# Patient Record
Sex: Female | Born: 1960 | Race: Black or African American | Hispanic: No | Marital: Married | Smoking: Never smoker
Health system: Southern US, Community
[De-identification: ages and names within clinical notes are randomized; demographics above are authoritative.]

## PROBLEM LIST (undated history)

## (undated) DIAGNOSIS — T7840XA Allergy, unspecified, initial encounter: Secondary | ICD-10-CM

## (undated) DIAGNOSIS — K5790 Diverticulosis of intestine, part unspecified, without perforation or abscess without bleeding: Secondary | ICD-10-CM

## (undated) DIAGNOSIS — I10 Essential (primary) hypertension: Secondary | ICD-10-CM

## (undated) DIAGNOSIS — K219 Gastro-esophageal reflux disease without esophagitis: Secondary | ICD-10-CM

## (undated) DIAGNOSIS — E669 Obesity, unspecified: Secondary | ICD-10-CM

## (undated) DIAGNOSIS — Z9189 Other specified personal risk factors, not elsewhere classified: Secondary | ICD-10-CM

## (undated) DIAGNOSIS — R011 Cardiac murmur, unspecified: Secondary | ICD-10-CM

## (undated) DIAGNOSIS — I080 Rheumatic disorders of both mitral and aortic valves: Secondary | ICD-10-CM

## (undated) DIAGNOSIS — Z87898 Personal history of other specified conditions: Secondary | ICD-10-CM

## (undated) DIAGNOSIS — J309 Allergic rhinitis, unspecified: Secondary | ICD-10-CM

## (undated) DIAGNOSIS — E785 Hyperlipidemia, unspecified: Secondary | ICD-10-CM

## (undated) DIAGNOSIS — M1990 Unspecified osteoarthritis, unspecified site: Secondary | ICD-10-CM

## (undated) DIAGNOSIS — R7302 Impaired glucose tolerance (oral): Secondary | ICD-10-CM

## (undated) DIAGNOSIS — K649 Unspecified hemorrhoids: Secondary | ICD-10-CM

## (undated) DIAGNOSIS — K589 Irritable bowel syndrome without diarrhea: Secondary | ICD-10-CM

## (undated) HISTORY — DX: Essential (primary) hypertension: I10

## (undated) HISTORY — PX: BREAST EXCISIONAL BIOPSY: SUR124

## (undated) HISTORY — DX: Impaired glucose tolerance (oral): R73.02

## (undated) HISTORY — DX: Diverticulosis of intestine, part unspecified, without perforation or abscess without bleeding: K57.90

## (undated) HISTORY — DX: Gastro-esophageal reflux disease without esophagitis: K21.9

## (undated) HISTORY — DX: Unspecified hemorrhoids: K64.9

## (undated) HISTORY — DX: Other specified personal risk factors, not elsewhere classified: Z91.89

## (undated) HISTORY — DX: Irritable bowel syndrome without diarrhea: K58.9

## (undated) HISTORY — DX: Allergy, unspecified, initial encounter: T78.40XA

## (undated) HISTORY — PX: LEG SURGERY: SHX1003

## (undated) HISTORY — PX: COLONOSCOPY: SHX174

## (undated) HISTORY — DX: Personal history of other specified conditions: Z87.898

## (undated) HISTORY — PX: BREAST BIOPSY: SHX20

## (undated) HISTORY — DX: Obesity, unspecified: E66.9

## (undated) HISTORY — DX: Hyperlipidemia, unspecified: E78.5

## (undated) HISTORY — PX: JOINT REPLACEMENT: SHX530

## (undated) HISTORY — PX: EYE SURGERY: SHX253

## (undated) HISTORY — DX: Allergic rhinitis, unspecified: J30.9

## (undated) HISTORY — DX: Rheumatic disorders of both mitral and aortic valves: I08.0

## (undated) HISTORY — PX: DG 5TH DIGIT LEFT FOOT: HXRAD1653

---

## 1997-12-04 ENCOUNTER — Encounter: Admission: RE | Admit: 1997-12-04 | Discharge: 1997-12-04 | Payer: Self-pay | Admitting: Family Medicine

## 1997-12-26 ENCOUNTER — Encounter: Admission: RE | Admit: 1997-12-26 | Discharge: 1997-12-26 | Payer: Self-pay | Admitting: Sports Medicine

## 1997-12-29 ENCOUNTER — Inpatient Hospital Stay (HOSPITAL_COMMUNITY): Admission: AD | Admit: 1997-12-29 | Discharge: 1997-12-29 | Payer: Self-pay | Admitting: Obstetrics & Gynecology

## 1998-01-10 ENCOUNTER — Encounter: Admission: RE | Admit: 1998-01-10 | Discharge: 1998-01-10 | Payer: Self-pay | Admitting: Family Medicine

## 1998-01-16 ENCOUNTER — Ambulatory Visit (HOSPITAL_COMMUNITY): Admission: RE | Admit: 1998-01-16 | Discharge: 1998-01-16 | Payer: Self-pay | Admitting: Obstetrics & Gynecology

## 1998-01-24 ENCOUNTER — Encounter: Admission: RE | Admit: 1998-01-24 | Discharge: 1998-01-24 | Payer: Self-pay | Admitting: Family Medicine

## 1998-02-18 ENCOUNTER — Encounter: Admission: RE | Admit: 1998-02-18 | Discharge: 1998-02-18 | Payer: Self-pay | Admitting: Family Medicine

## 1998-02-18 ENCOUNTER — Ambulatory Visit (HOSPITAL_COMMUNITY): Admission: RE | Admit: 1998-02-18 | Discharge: 1998-02-18 | Payer: Self-pay | Admitting: Family Medicine

## 1998-02-21 ENCOUNTER — Ambulatory Visit (HOSPITAL_COMMUNITY): Admission: RE | Admit: 1998-02-21 | Discharge: 1998-02-21 | Payer: Self-pay | Admitting: Obstetrics

## 1998-03-07 ENCOUNTER — Encounter: Admission: RE | Admit: 1998-03-07 | Discharge: 1998-03-07 | Payer: Self-pay | Admitting: Obstetrics & Gynecology

## 1998-08-18 ENCOUNTER — Inpatient Hospital Stay (HOSPITAL_COMMUNITY): Admission: EM | Admit: 1998-08-18 | Discharge: 1998-08-21 | Payer: Self-pay | Admitting: Emergency Medicine

## 1998-08-18 ENCOUNTER — Encounter: Payer: Self-pay | Admitting: Emergency Medicine

## 1998-08-18 ENCOUNTER — Encounter: Payer: Self-pay | Admitting: Orthopedic Surgery

## 1998-11-20 ENCOUNTER — Encounter: Admission: RE | Admit: 1998-11-20 | Discharge: 1998-11-20 | Payer: Self-pay | Admitting: Family Medicine

## 1999-02-06 ENCOUNTER — Other Ambulatory Visit: Admission: RE | Admit: 1999-02-06 | Discharge: 1999-02-06 | Payer: Self-pay | Admitting: *Deleted

## 1999-03-05 ENCOUNTER — Encounter: Payer: Self-pay | Admitting: *Deleted

## 1999-03-05 ENCOUNTER — Ambulatory Visit (HOSPITAL_COMMUNITY): Admission: RE | Admit: 1999-03-05 | Discharge: 1999-03-05 | Payer: Self-pay | Admitting: *Deleted

## 1999-04-14 ENCOUNTER — Other Ambulatory Visit: Admission: RE | Admit: 1999-04-14 | Discharge: 1999-04-14 | Payer: Self-pay | Admitting: Gynecology

## 1999-05-01 ENCOUNTER — Encounter (INDEPENDENT_AMBULATORY_CARE_PROVIDER_SITE_OTHER): Payer: Self-pay | Admitting: Specialist

## 1999-05-01 ENCOUNTER — Other Ambulatory Visit: Admission: RE | Admit: 1999-05-01 | Discharge: 1999-05-01 | Payer: Self-pay | Admitting: Gynecology

## 1999-07-23 ENCOUNTER — Inpatient Hospital Stay (HOSPITAL_COMMUNITY): Admission: AD | Admit: 1999-07-23 | Discharge: 1999-07-23 | Payer: Self-pay | Admitting: Gynecology

## 1999-10-22 ENCOUNTER — Other Ambulatory Visit: Admission: RE | Admit: 1999-10-22 | Discharge: 1999-10-22 | Payer: Self-pay | Admitting: Gynecology

## 2000-09-14 ENCOUNTER — Other Ambulatory Visit: Admission: RE | Admit: 2000-09-14 | Discharge: 2000-09-14 | Payer: Self-pay | Admitting: *Deleted

## 2000-09-22 ENCOUNTER — Encounter: Payer: Self-pay | Admitting: *Deleted

## 2000-09-22 ENCOUNTER — Encounter: Admission: RE | Admit: 2000-09-22 | Discharge: 2000-09-22 | Payer: Self-pay | Admitting: *Deleted

## 2000-12-19 ENCOUNTER — Encounter: Payer: Self-pay | Admitting: *Deleted

## 2000-12-19 ENCOUNTER — Ambulatory Visit (HOSPITAL_COMMUNITY): Admission: RE | Admit: 2000-12-19 | Discharge: 2000-12-19 | Payer: Self-pay | Admitting: *Deleted

## 2001-09-25 ENCOUNTER — Encounter: Admission: RE | Admit: 2001-09-25 | Discharge: 2001-11-08 | Payer: Self-pay | Admitting: Otolaryngology

## 2001-09-26 ENCOUNTER — Encounter: Payer: Self-pay | Admitting: *Deleted

## 2001-09-26 ENCOUNTER — Encounter: Admission: RE | Admit: 2001-09-26 | Discharge: 2001-09-26 | Payer: Self-pay | Admitting: *Deleted

## 2001-09-26 ENCOUNTER — Other Ambulatory Visit: Admission: RE | Admit: 2001-09-26 | Discharge: 2001-09-26 | Payer: Self-pay | Admitting: *Deleted

## 2002-10-01 ENCOUNTER — Other Ambulatory Visit: Admission: RE | Admit: 2002-10-01 | Discharge: 2002-10-01 | Payer: Self-pay | Admitting: *Deleted

## 2002-10-03 ENCOUNTER — Encounter: Payer: Self-pay | Admitting: Internal Medicine

## 2002-10-03 ENCOUNTER — Encounter: Admission: RE | Admit: 2002-10-03 | Discharge: 2002-10-03 | Payer: Self-pay | Admitting: Internal Medicine

## 2003-05-17 ENCOUNTER — Emergency Department (HOSPITAL_COMMUNITY): Admission: EM | Admit: 2003-05-17 | Discharge: 2003-05-17 | Payer: Self-pay | Admitting: Emergency Medicine

## 2003-10-07 ENCOUNTER — Other Ambulatory Visit: Admission: RE | Admit: 2003-10-07 | Discharge: 2003-10-07 | Payer: Self-pay | Admitting: Gynecology

## 2003-10-07 ENCOUNTER — Encounter: Admission: RE | Admit: 2003-10-07 | Discharge: 2003-10-07 | Payer: Self-pay | Admitting: Internal Medicine

## 2004-06-12 ENCOUNTER — Ambulatory Visit: Payer: Self-pay | Admitting: Internal Medicine

## 2004-10-09 ENCOUNTER — Other Ambulatory Visit: Admission: RE | Admit: 2004-10-09 | Discharge: 2004-10-09 | Payer: Self-pay | Admitting: Obstetrics and Gynecology

## 2004-10-12 ENCOUNTER — Encounter: Admission: RE | Admit: 2004-10-12 | Discharge: 2004-10-12 | Payer: Self-pay | Admitting: Internal Medicine

## 2004-12-08 ENCOUNTER — Ambulatory Visit: Payer: Self-pay | Admitting: Internal Medicine

## 2004-12-18 ENCOUNTER — Ambulatory Visit: Payer: Self-pay | Admitting: Internal Medicine

## 2005-10-13 ENCOUNTER — Other Ambulatory Visit: Admission: RE | Admit: 2005-10-13 | Discharge: 2005-10-13 | Payer: Self-pay | Admitting: Obstetrics and Gynecology

## 2005-10-13 ENCOUNTER — Encounter: Admission: RE | Admit: 2005-10-13 | Discharge: 2005-10-13 | Payer: Self-pay | Admitting: Internal Medicine

## 2006-02-07 ENCOUNTER — Ambulatory Visit: Payer: Self-pay | Admitting: Internal Medicine

## 2006-05-02 ENCOUNTER — Ambulatory Visit: Payer: Self-pay | Admitting: Internal Medicine

## 2006-10-19 ENCOUNTER — Encounter: Admission: RE | Admit: 2006-10-19 | Discharge: 2006-10-19 | Payer: Self-pay | Admitting: Internal Medicine

## 2007-03-31 ENCOUNTER — Ambulatory Visit: Payer: Self-pay | Admitting: Internal Medicine

## 2007-04-02 ENCOUNTER — Encounter: Payer: Self-pay | Admitting: Internal Medicine

## 2007-04-02 DIAGNOSIS — K219 Gastro-esophageal reflux disease without esophagitis: Secondary | ICD-10-CM

## 2007-04-02 DIAGNOSIS — E785 Hyperlipidemia, unspecified: Secondary | ICD-10-CM

## 2007-04-02 DIAGNOSIS — I080 Rheumatic disorders of both mitral and aortic valves: Secondary | ICD-10-CM

## 2007-04-02 DIAGNOSIS — J309 Allergic rhinitis, unspecified: Secondary | ICD-10-CM

## 2007-04-02 DIAGNOSIS — Z87898 Personal history of other specified conditions: Secondary | ICD-10-CM | POA: Insufficient documentation

## 2007-04-02 DIAGNOSIS — K589 Irritable bowel syndrome without diarrhea: Secondary | ICD-10-CM

## 2007-04-02 DIAGNOSIS — M545 Low back pain, unspecified: Secondary | ICD-10-CM | POA: Insufficient documentation

## 2007-04-02 DIAGNOSIS — Z9189 Other specified personal risk factors, not elsewhere classified: Secondary | ICD-10-CM | POA: Insufficient documentation

## 2007-04-02 DIAGNOSIS — E669 Obesity, unspecified: Secondary | ICD-10-CM

## 2007-04-02 DIAGNOSIS — G43909 Migraine, unspecified, not intractable, without status migrainosus: Secondary | ICD-10-CM | POA: Insufficient documentation

## 2007-04-02 HISTORY — DX: Obesity, unspecified: E66.9

## 2007-04-02 HISTORY — DX: Hyperlipidemia, unspecified: E78.5

## 2007-04-02 HISTORY — DX: Rheumatic disorders of both mitral and aortic valves: I08.0

## 2007-04-02 HISTORY — DX: Irritable bowel syndrome, unspecified: K58.9

## 2007-04-02 HISTORY — DX: Personal history of other specified conditions: Z87.898

## 2007-04-02 HISTORY — DX: Allergic rhinitis, unspecified: J30.9

## 2007-04-02 HISTORY — DX: Other specified personal risk factors, not elsewhere classified: Z91.89

## 2007-04-24 ENCOUNTER — Ambulatory Visit: Payer: Self-pay | Admitting: Internal Medicine

## 2007-04-24 LAB — CONVERTED CEMR LAB
ALT: 19 units/L (ref 0–35)
Alkaline Phosphatase: 86 units/L (ref 39–117)
BUN: 8 mg/dL (ref 6–23)
Basophils Absolute: 0 10*3/uL (ref 0.0–0.1)
Cholesterol: 218 mg/dL (ref 0–200)
Creatinine, Ser: 0.7 mg/dL (ref 0.4–1.2)
Direct LDL: 153.8 mg/dL
Eosinophils Absolute: 0.2 10*3/uL (ref 0.0–0.6)
Eosinophils Relative: 2.8 % (ref 0.0–5.0)
Glucose, Bld: 102 mg/dL — ABNORMAL HIGH (ref 70–99)
HCT: 39.3 % (ref 36.0–46.0)
Monocytes Absolute: 0.4 10*3/uL (ref 0.2–0.7)
Monocytes Relative: 7.3 % (ref 3.0–11.0)
Neutro Abs: 2.7 10*3/uL (ref 1.4–7.7)
Platelets: 265 10*3/uL (ref 150–400)
RDW: 13.3 % (ref 11.5–14.6)
Sodium: 142 meq/L (ref 135–145)
Specific Gravity, Urine: 1.02 (ref 1.000–1.03)
TSH: 2.44 microintl units/mL (ref 0.35–5.50)
Total Bilirubin: 0.7 mg/dL (ref 0.3–1.2)
Total Protein, Urine: NEGATIVE mg/dL
Total Protein: 7.5 g/dL (ref 6.0–8.3)
Urine Glucose: NEGATIVE mg/dL
Urobilinogen, UA: 0.2 (ref 0.0–1.0)
VLDL: 26 mg/dL (ref 0–40)
WBC: 5.6 10*3/uL (ref 4.5–10.5)

## 2007-05-04 ENCOUNTER — Encounter: Payer: Self-pay | Admitting: Internal Medicine

## 2007-05-04 ENCOUNTER — Ambulatory Visit: Payer: Self-pay | Admitting: Internal Medicine

## 2007-05-04 DIAGNOSIS — I10 Essential (primary) hypertension: Secondary | ICD-10-CM

## 2007-05-04 HISTORY — DX: Essential (primary) hypertension: I10

## 2007-06-20 ENCOUNTER — Telehealth (INDEPENDENT_AMBULATORY_CARE_PROVIDER_SITE_OTHER): Payer: Self-pay | Admitting: *Deleted

## 2007-06-23 ENCOUNTER — Ambulatory Visit: Payer: Self-pay | Admitting: Internal Medicine

## 2007-06-23 DIAGNOSIS — R21 Rash and other nonspecific skin eruption: Secondary | ICD-10-CM

## 2007-09-01 ENCOUNTER — Telehealth: Payer: Self-pay | Admitting: Internal Medicine

## 2007-10-04 ENCOUNTER — Telehealth: Payer: Self-pay | Admitting: Internal Medicine

## 2007-10-23 ENCOUNTER — Other Ambulatory Visit: Admission: RE | Admit: 2007-10-23 | Discharge: 2007-10-23 | Payer: Self-pay | Admitting: Obstetrics and Gynecology

## 2007-10-23 ENCOUNTER — Encounter: Admission: RE | Admit: 2007-10-23 | Discharge: 2007-10-23 | Payer: Self-pay | Admitting: Internal Medicine

## 2007-12-20 ENCOUNTER — Telehealth: Payer: Self-pay | Admitting: Internal Medicine

## 2008-01-01 ENCOUNTER — Ambulatory Visit: Payer: Self-pay | Admitting: Internal Medicine

## 2008-03-01 ENCOUNTER — Telehealth: Payer: Self-pay | Admitting: Internal Medicine

## 2008-04-24 ENCOUNTER — Ambulatory Visit: Payer: Self-pay | Admitting: Internal Medicine

## 2008-04-24 LAB — CONVERTED CEMR LAB
AST: 19 units/L (ref 0–37)
BUN: 9 mg/dL (ref 6–23)
Bilirubin Urine: NEGATIVE
Bilirubin, Direct: 0.1 mg/dL (ref 0.0–0.3)
CO2: 29 meq/L (ref 19–32)
Calcium: 9.1 mg/dL (ref 8.4–10.5)
Chloride: 105 meq/L (ref 96–112)
Cholesterol: 181 mg/dL (ref 0–200)
Eosinophils Absolute: 0.1 10*3/uL (ref 0.0–0.7)
HCT: 39.4 % (ref 36.0–46.0)
Ketones, ur: NEGATIVE mg/dL
Monocytes Absolute: 0.3 10*3/uL (ref 0.1–1.0)
Neutro Abs: 3.1 10*3/uL (ref 1.4–7.7)
Neutrophils Relative %: 51.6 % (ref 43.0–77.0)
Nitrite: NEGATIVE
Potassium: 3.8 meq/L (ref 3.5–5.1)
RBC: 4.77 M/uL (ref 3.87–5.11)
RDW: 13.2 % (ref 11.5–14.6)
Sodium: 140 meq/L (ref 135–145)
Specific Gravity, Urine: <=1.005 (ref 1.000–1.03)
TSH: 1.45 microintl units/mL (ref 0.35–5.50)
Total Protein, Urine: NEGATIVE mg/dL
Urine Glucose: NEGATIVE mg/dL
VLDL: 12 mg/dL (ref 0–40)
WBC: 5.9 10*3/uL (ref 4.5–10.5)
pH: 7 (ref 5.0–8.0)

## 2008-05-06 ENCOUNTER — Ambulatory Visit: Payer: Self-pay | Admitting: Internal Medicine

## 2008-05-06 DIAGNOSIS — J069 Acute upper respiratory infection, unspecified: Secondary | ICD-10-CM | POA: Insufficient documentation

## 2008-06-24 ENCOUNTER — Telehealth: Payer: Self-pay | Admitting: Internal Medicine

## 2008-09-20 ENCOUNTER — Telehealth (INDEPENDENT_AMBULATORY_CARE_PROVIDER_SITE_OTHER): Payer: Self-pay | Admitting: *Deleted

## 2008-09-26 ENCOUNTER — Ambulatory Visit: Payer: Self-pay | Admitting: Internal Medicine

## 2008-09-26 DIAGNOSIS — L299 Pruritus, unspecified: Secondary | ICD-10-CM | POA: Insufficient documentation

## 2008-10-29 ENCOUNTER — Other Ambulatory Visit: Admission: RE | Admit: 2008-10-29 | Discharge: 2008-10-29 | Payer: Self-pay | Admitting: Obstetrics and Gynecology

## 2008-10-29 ENCOUNTER — Encounter: Admission: RE | Admit: 2008-10-29 | Discharge: 2008-10-29 | Payer: Self-pay | Admitting: Internal Medicine

## 2009-03-20 ENCOUNTER — Ambulatory Visit: Payer: Self-pay | Admitting: Internal Medicine

## 2009-03-20 DIAGNOSIS — M79609 Pain in unspecified limb: Secondary | ICD-10-CM | POA: Insufficient documentation

## 2009-03-21 ENCOUNTER — Telehealth: Payer: Self-pay | Admitting: Internal Medicine

## 2009-04-25 ENCOUNTER — Ambulatory Visit: Payer: Self-pay | Admitting: Internal Medicine

## 2009-04-25 DIAGNOSIS — J029 Acute pharyngitis, unspecified: Secondary | ICD-10-CM

## 2009-04-25 LAB — CONVERTED CEMR LAB
Basophils Relative: 0.9 % (ref 0.0–3.0)
Bilirubin, Direct: 0.1 mg/dL (ref 0.0–0.3)
CO2: 32 meq/L (ref 19–32)
Calcium: 9.3 mg/dL (ref 8.4–10.5)
Chloride: 102 meq/L (ref 96–112)
Eosinophils Absolute: 0.2 10*3/uL (ref 0.0–0.7)
GFR calc non Af Amer: 136.19 mL/min (ref >60)
Glucose, Bld: 84 mg/dL (ref 70–99)
HCT: 39.3 % (ref 36.0–46.0)
HDL: 45.1 mg/dL (ref >39.00)
Lymphocytes Relative: 34.5 % (ref 12.0–46.0)
Monocytes Absolute: 0.3 10*3/uL (ref 0.1–1.0)
Neutrophils Relative %: 56.2 % (ref 43.0–77.0)
Nitrite: NEGATIVE
Potassium: 4 meq/L (ref 3.5–5.1)
RBC: 4.68 M/uL (ref 3.87–5.11)
RDW: 14.6 % (ref 11.5–14.6)
Rapid Strep: NEGATIVE
Sodium: 139 meq/L (ref 135–145)
Specific Gravity, Urine: 1.01 (ref 1.000–1.030)
TSH: 0.83 microintl units/mL (ref 0.35–5.50)
Total Protein: 7.3 g/dL (ref 6.0–8.3)
Urine Glucose: NEGATIVE mg/dL
VLDL: 13.8 mg/dL (ref 0.0–40.0)
pH: 7 (ref 5.0–8.0)

## 2009-04-28 ENCOUNTER — Telehealth: Payer: Self-pay | Admitting: Internal Medicine

## 2009-05-09 ENCOUNTER — Ambulatory Visit: Payer: Self-pay | Admitting: Internal Medicine

## 2009-06-27 ENCOUNTER — Telehealth: Payer: Self-pay | Admitting: Internal Medicine

## 2009-07-02 ENCOUNTER — Telehealth: Payer: Self-pay | Admitting: Internal Medicine

## 2009-07-02 ENCOUNTER — Ambulatory Visit: Payer: Self-pay | Admitting: Internal Medicine

## 2009-07-12 ENCOUNTER — Emergency Department (HOSPITAL_COMMUNITY): Admission: EM | Admit: 2009-07-12 | Discharge: 2009-07-12 | Payer: Self-pay | Admitting: Emergency Medicine

## 2009-10-30 ENCOUNTER — Other Ambulatory Visit: Admission: RE | Admit: 2009-10-30 | Discharge: 2009-10-30 | Payer: Self-pay | Admitting: Obstetrics and Gynecology

## 2009-10-30 ENCOUNTER — Encounter: Admission: RE | Admit: 2009-10-30 | Discharge: 2009-10-30 | Payer: Self-pay | Admitting: Internal Medicine

## 2009-11-05 LAB — CONVERTED CEMR LAB: Pap Smear: NORMAL

## 2009-12-18 ENCOUNTER — Encounter (INDEPENDENT_AMBULATORY_CARE_PROVIDER_SITE_OTHER): Payer: Self-pay | Admitting: *Deleted

## 2009-12-22 ENCOUNTER — Ambulatory Visit: Payer: Self-pay | Admitting: Internal Medicine

## 2009-12-30 ENCOUNTER — Telehealth: Payer: Self-pay | Admitting: Internal Medicine

## 2010-01-01 ENCOUNTER — Ambulatory Visit: Payer: Self-pay | Admitting: Internal Medicine

## 2010-02-18 ENCOUNTER — Ambulatory Visit: Payer: Self-pay | Admitting: Internal Medicine

## 2010-02-18 ENCOUNTER — Encounter: Payer: Self-pay | Admitting: Internal Medicine

## 2010-02-18 DIAGNOSIS — M899 Disorder of bone, unspecified: Secondary | ICD-10-CM | POA: Insufficient documentation

## 2010-02-18 DIAGNOSIS — M949 Disorder of cartilage, unspecified: Secondary | ICD-10-CM

## 2010-02-18 DIAGNOSIS — M25579 Pain in unspecified ankle and joints of unspecified foot: Secondary | ICD-10-CM

## 2010-02-23 ENCOUNTER — Encounter: Payer: Self-pay | Admitting: Internal Medicine

## 2010-02-24 ENCOUNTER — Encounter: Payer: Self-pay | Admitting: Internal Medicine

## 2010-02-24 ENCOUNTER — Ambulatory Visit (HOSPITAL_COMMUNITY): Admission: RE | Admit: 2010-02-24 | Discharge: 2010-02-24 | Payer: Self-pay | Admitting: Internal Medicine

## 2010-03-02 ENCOUNTER — Encounter: Payer: Self-pay | Admitting: Internal Medicine

## 2010-06-25 LAB — CONVERTED CEMR LAB
ALT: 13 units/L (ref 0–35)
Albumin: 4 g/dL (ref 3.5–5.2)
Alkaline Phosphatase: 93 units/L (ref 39–117)
Basophils Absolute: 0 10*3/uL (ref 0.0–0.1)
Bilirubin Urine: NEGATIVE
Bilirubin, Direct: 0.1 mg/dL (ref 0.0–0.3)
CO2: 32 meq/L (ref 19–32)
Chloride: 103 meq/L (ref 96–112)
Cholesterol: 212 mg/dL — ABNORMAL HIGH (ref 0–200)
Direct LDL: 138.9 mg/dL
Eosinophils Relative: 2.7 % (ref 0.0–5.0)
GFR calc non Af Amer: 132.99 mL/min (ref >60.00)
Lymphocytes Relative: 36.2 % (ref 12.0–46.0)
MCHC: 33.6 g/dL (ref 30.0–36.0)
Monocytes Relative: 5.7 % (ref 3.0–12.0)
Neutrophils Relative %: 55.1 % (ref 43.0–77.0)
Nitrite: NEGATIVE
Potassium: 3.9 meq/L (ref 3.5–5.1)
RBC: 4.79 M/uL (ref 3.87–5.11)
RDW: 14 % (ref 11.5–14.6)
TSH: 1.05 microintl units/mL (ref 0.35–5.50)
Total Bilirubin: 0.4 mg/dL (ref 0.3–1.2)
Total CHOL/HDL Ratio: 5
Total Protein, Urine: NEGATIVE mg/dL
Urine Glucose: NEGATIVE mg/dL
Urobilinogen, UA: 0.2 (ref 0.0–1.0)
WBC: 5.9 10*3/uL (ref 4.5–10.5)
pH: 7.5 (ref 5.0–8.0)

## 2010-06-26 ENCOUNTER — Ambulatory Visit: Payer: Self-pay | Admitting: Internal Medicine

## 2010-07-03 ENCOUNTER — Encounter: Payer: Self-pay | Admitting: Internal Medicine

## 2010-07-03 ENCOUNTER — Ambulatory Visit: Payer: Self-pay | Admitting: Internal Medicine

## 2010-07-03 DIAGNOSIS — K089 Disorder of teeth and supporting structures, unspecified: Secondary | ICD-10-CM | POA: Insufficient documentation

## 2010-08-02 ENCOUNTER — Encounter: Payer: Self-pay | Admitting: Gynecology

## 2010-08-02 ENCOUNTER — Encounter: Payer: Self-pay | Admitting: Internal Medicine

## 2010-08-06 ENCOUNTER — Telehealth (INDEPENDENT_AMBULATORY_CARE_PROVIDER_SITE_OTHER): Payer: Self-pay | Admitting: *Deleted

## 2010-08-11 NOTE — Consult Note (Signed)
Summary: [facility]  [facility]   Imported By: Lester Parchment 03/02/2010 10:06:46  _____________________________________________________________________  External Attachment:    Type:   Image     Comment:   External Document

## 2010-08-11 NOTE — Miscellaneous (Signed)
Summary: [facility]  Clinical Lists Changes  Medications: Added new medication of MOVIPREP 100 GM  SOLR (PEG-KCL-NACL-NASULF-NA ASC-C) As per prep instructions. - Signed Rx of MOVIPREP 100 GM  SOLR (PEG-KCL-NACL-NASULF-NA ASC-C) As per prep instructions.;  #1 x 0;  Signed;  Entered by: Ezra Sites RN;  Authorized by: Hilarie Fredrickson MD;  Method used: [facility] [address] [phone]*, [address] [place], [place], [place]  [address], Ph: [phone] or [phone], Fax: [phone] Observations: Added new observation of ALLERGY REV: Done (12/22/2009 13:24)    Prescriptions: MOVIPREP 100 GM  SOLR (PEG-KCL-NACL-NASULF-NA ASC-C) As per prep instructions.  #1 x 0   Entered by:   Ezra Sites RN   Authorized by:   Hilarie Fredrickson MD   Signed by:   Ezra Sites RN on 12/22/2009   Method used:   [facility]        [facility] [address] [phone]* (retail)       [address]       [place], [place]  [address]       Ph: [phone] or [phone]       Fax: [phone]   RxID:   [id number]

## 2010-08-11 NOTE — Assessment & Plan Note (Signed)
Summary: ANKLE PAIN AFTER FALL A MO. AGO/NWS  #   Vital Signs:  Patient profile:   50 year old female Height:      62 inches Weight:      166.50 pounds BMI:     30.56 O2 Sat:      96 % on Room air Temp:     98 degrees F oral Pulse rate:   76 / minute BP sitting:   120 / 80  (left arm) Cuff size:   regular  Vitals Entered By: Zella Ball Ewing CMA Duncan Dull) (February 18, 2010 8:19 AM)  O2 Flow:  Room air CC: Left ankle pain for 1 month/RE   Primary Care Provider:  Corwin Levins MD  CC:  Left ankle pain for 1 month/RE.  History of Present Illness: here wtih persistent left ankle pain and swelling, after walking the dogs a month ago  - twisted up in the leashes , tripped and fell and prob twisted the ankle, since it seemed to pain and swell just after;  some overall improved, but seems to just never resolve in the past 4 wks iwth pain and swelling;  has hx of fx of leg and has a screw to the left ankle area but she is not sure of details (surgury done 2000 - does not remember ortho).  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 No fever, hx of gout, and no other injiury or fall since then.  Overall pain is 7/10, with wt bearing 10/10 per pt.  Preventive Screening-Counseling & Management      Drug Use:  no.    Problems Prior to Update: 1)  Pharyngitis-acute  (ICD-462) 2)  Leg Pain, Bilateral  (ICD-729.5) 3)  Unspecified Pruritic Disorder  (ICD-698.9) 4)  Uri  (ICD-465.9) 5)  Preventive Health Care  (ICD-V70.0) 6)  Rash-nonvesicular  (ICD-782.1) 7)  Hypertension, Benign Essential  (ICD-401.1) 8)  Preventive Health Care  (ICD-V70.0) 9)  Low Back Pain  (ICD-724.2) 10)  Hyperlipidemia  (ICD-272.4) 11)  Gerd   (ICD-530.81) 12)  Allergic Rhinitis  (ICD-477.9) 13)  Mitral Regurgitation, 0 (MILD)  (ICD-396.3) 14)  Irritable Bowel Syndrome  (ICD-564.1) 15)  Obesity  (ICD-278.00) 16)  Breast Biopsy, Hx of  (ICD-V15.9) 17)  Migraine Headache  (ICD-346.90) 18)  Fibrocystic Breast Disease, Hx of  (ICD-V13.9)  Medications Prior to Update: 1)  Ecotrin 325 Mg  Tbec (Aspirin) .[name].. 1 By Mouth Qd 2)  Benazepril Hcl 20 Mg Tabs (Benazepril Hcl) .[name].. 1 By Mouth Once Daily 3)  Black Cohosh 40 Mg Caps (Black Cohosh) .[name].. 1 By Mouth Three Times A Day With Each Meal 4)  Tramadol Hcl 50 Mg Tabs (Tramadol Hcl) .[name].. 1po Four Time Per Day As Needed For Pain 5)  Fluconazole 150 Mg Tabs (Fluconazole) .[name].. 1 By Mouth Q 3 Days As Needed 6)  Nystatin 100000 Unit/ml Susp (Nystatin) .[name].. 1 Tsp Swish and Swallow X 7 Days, Then As Needed 7)  Benzonatate 100 Mg Caps (Benzonatate) .[name].. 1 By Mouth Three Times A Day X 7 Days, Then As Needed Cough 8)  Prilosec Otc 20 Mg Tbec (Omeprazole Magnesium) .[name].. 1 By Mouth Once Daily X 7 Days, Then As Needed 9)  Claritin-D 12  Hour 5-120 Mg Xr12h-Tab (Loratadine-Pseudoephedrine) .[name].. 1 By Mouth  Every Morning X 7 Days, Then As Needed  Current Medications (verified): 1)  Ecotrin 325 Mg  Tbec (Aspirin) .[name].. 1 By Mouth Qd 2)  Benazepril Hcl 20 Mg Tabs (Benazepril Hcl) .[name].. 1 By Mouth Once Daily 3)  Black Cohosh 40 Mg Caps (Black Cohosh) .[name].. 1 By Mouth Three Times A Day With Each Meal 4)  Tramadol Hcl 50 Mg Tabs (Tramadol Hcl) .[name].. 1po Four Time Per Day As Needed For Pain 5)  Fluconazole 150 Mg Tabs (Fluconazole) .[name].. 1 By Mouth Q 3 Days As Needed 6)  Nystatin 100000 Unit/ml Susp (Nystatin) .[name].. 1 Tsp Swish and Swallow X 7 Days, Then As Needed 7)  Benzonatate 100 Mg Caps (Benzonatate) .[name].. 1 By Mouth Three Times A Day X 7 Days, Then As Needed Cough 8)  Prilosec Otc 20 Mg Tbec (Omeprazole Magnesium) .[name].. 1 By Mouth Once Daily X 7 Days, Then As Needed 9)  Claritin-D 12 Hour 5-120 Mg Xr12h-Tab  (Loratadine-Pseudoephedrine) .[name].. 1 By Mouth  Every Morning X 7 Days, Then As Needed 10)  Hydrocodone-Acetaminophen 5-325 Mg Tabs (Hydrocodone-Acetaminophen) .[name].. 1po Q 6 Hrs As Needed Pain  Allergies (verified): 1)  ! Pcn 2)  ! * Hyoscyamine 3)  ! * Tramadol 4)  ! * Benazepril 5)  ! * Alleve 6)  ! 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   Head:  normocephalic and atraumatic.   Eyes:  vision grossly intact, pupils equal, and pupils round.   Ears:  R ear normal and L ear normal.   Nose:  no external deformity and no nasal discharge.   Mouth:  no gingival abnormalities and pharynx pink and moist.   Neck:  supple and no masses.   Lungs:  normal respiratory effort and normal breath sounds.   Heart:  normal rate and regular rhythm.   Msk:  left lateral ankle with 3+ swelling but mild tender  with mild decr ROM -o/w neurovasc intact Extremities:  no edema, no erythema    Impression & Recommendations:  Problem # 1:  ANKLE PAIN, LEFT (ICD-719.47)  suspect more significant pain,swelling related to instability of prior surgury - for pain med, film today, and  refer [place] ortho (bednarz if possible)  Orders: T-Ankle Comp Left Min 3 Views [phone]) Orthopedic Surgeon Referral (Ortho Surgeon)  Problem # 2:  HYPERTENSION, BENIGN ESSENTIAL (ICD-401.1)  Her updated medication list for this problem includes:    Benazepril Hcl 20 Mg Tabs (Benazepril hcl) .[name]... 1 by mouth once daily  BP today: 120/80 Prior BP: 120/90 (07/02/2009)  Prior 10 Yr Risk Heart Disease: 8 % (04/25/2009)  Labs Reviewed: K+: 4.0 (04/25/2009) Creat: : 0.6 (04/25/2009)   Chol: 205 (04/25/2009)   HDL: 45.10 (  04/25/2009)   LDL: 127 (04/24/2008)   TG: 69.0 (04/25/2009) stable overall by hx and exam, ok to continue meds/tx as is   Complete Medication List: 1)  Ecotrin 325 Mg Tbec (Aspirin) .[name].. 1 by mouth qd 2)  Benazepril Hcl 20 Mg Tabs (Benazepril hcl) .[name].. 1 by mouth once daily 3)  Black Cohosh 40 Mg Caps (Black cohosh) .[name].. 1 by mouth three times a day with each meal 4)  Tramadol Hcl 50 Mg Tabs (Tramadol hcl) .[name].. 1po four time per day as needed for pain 5)  Fluconazole 150 Mg Tabs (Fluconazole) .[name].. 1 by mouth q 3 days as needed 6)  Nystatin 100000 Unit/ml Susp (Nystatin) .[name].. 1 tsp swish and swallow x 7 days, then as needed 7)  Benzonatate 100 Mg Caps (Benzonatate) .[name].. 1 by mouth three times a day x 7 days, then as needed cough 8)  Prilosec Otc 20 Mg Tbec (Omeprazole magnesium) .[name].. 1 by mouth once daily x 7 days, then as needed 9)  Claritin-d 12 Hour 5-120 Mg Xr12h-tab (Loratadine-pseudoephedrine) .[name].. 1 by mouth  every morning x 7 days, then as needed 10)  Hydrocodone-acetaminophen 5-325 Mg Tabs (Hydrocodone-acetaminophen) .[name].. 1po q 6 hrs as needed pain  Patient Instructions: 1)  Please go to Radiology in the basement level for your X-Ray today  2)  You will be contacted about the referral(s) to: orthopedic 3)  Please take all new medications as prescribed - the pain medication 4)  Continue all previous medications as before this visit  5)  Please call the number on  the [facility] for results of your testing  6)  Please schedule a follow-up appointment in 2 months with CPX labs Prescriptions: HYDROCODONE-ACETAMINOPHEN 5-325 MG TABS (HYDROCODONE-ACETAMINOPHEN) 1po q 6 hrs as needed pain  #60 x 1   Entered and Authorized by:   Corwin Levins MD   Signed by:   Corwin Levins MD on 02/18/2010   Method used:   Print then Give to Patient   RxID:   [id number]

## 2010-08-11 NOTE — Progress Notes (Signed)
Summary: prep ?  Phone Note From Pharmacy Call back at [phone]   Caller: Waynetta Sandy, pharm tech Call For: Dr. Marina Goodell   Summary of Call: pt would like cheaper prep Initial call taken by: Vallarie Mare,  December 30, 2009 4:41 PM  Follow-up for Phone Call        Pharmacy tech "gone home" when i called her at 5:09pm today, so then i called patient, she said she did not have money for this, so i did offer the $20 mail in rebate since her procedure was 6/23, she agreeed and thought that would be helpful for buying the prep. i placed this with her chart in [facility]! Follow-up by: Sherren Kerns RN,  December 30, 2009 5:21 PM

## 2010-08-11 NOTE — Letter (Signed)
Summary: [facility] Instructions  [facility]  [address] [place], [place] [address]   Phone: [phone]  Fax: [phone]       Sylvia Davies    27-Jun-1961    MRN: [id number]        Procedure Day /Date: Thursday 01/01/2010     Arrival Time: 7:30 am      Procedure Time: 8:30 am     Location of Procedure:                    _x _  [facility] (4th Floor)                        PREPARATION FOR COLONOSCOPY WITH MOVIPREP   Starting 5 days prior to your procedure Saturday 6/18 do not eat nuts, seeds, popcorn, corn, beans, peas,  salads, or any raw vegetables.  Do not take any fiber supplements (e.g. Metamucil, Citrucel, and Benefiber).  THE DAY BEFORE YOUR PROCEDURE         DATE: Wednesday 6/22  1.  Drink clear liquids the entire day-NO SOLID FOOD  2.  Do not drink anything colored red or purple.  Avoid juices with pulp.  No orange juice.  3.  Drink at least 64 oz. (8 glasses) of fluid/clear liquids during the day to prevent dehydration and help the prep work efficiently.  CLEAR LIQUIDS INCLUDE: Water Jello Ice Popsicles Tea (sugar ok, no milk/cream) Powdered fruit flavored drinks Coffee (sugar ok, no milk/cream) Gatorade Juice: apple, white grape, white cranberry  Lemonade Clear bullion, consomm, broth Carbonated beverages (any kind) Strained chicken noodle soup Hard Candy                             4.  In the morning, mix first dose of MoviPrep solution:    Empty 1 Pouch A and 1 Pouch B into the disposable container    Add lukewarm drinking water to the top line of the container. Mix to dissolve    Refrigerate (mixed solution should be used within 24 hrs)  5.  Begin drinking the prep at 5:00 p.m. The MoviPrep container is divided by 4 marks.   Every 15 minutes drink the solution down to the next mark (approximately 8 oz) until the full liter is complete.   6.  Follow completed prep with 16 oz of clear liquid of your choice  (Nothing red or purple).  Continue to drink clear liquids until bedtime.  7.  Before going to bed, mix second dose of MoviPrep solution:    Empty 1 Pouch A and 1 Pouch B into the disposable container    Add lukewarm drinking water to the top line of the container. Mix to dissolve    Refrigerate  THE DAY OF YOUR PROCEDURE      DATE: Thursday 6/23  Beginning at 3:30 a.m. (5 hours before procedure):         1. Every 15 minutes, drink the solution down to the next mark (approx 8 oz) until the full liter is complete.  2. Follow completed prep with 16 oz. of clear liquid of your choice.    3. You may drink clear liquids until 6:30 am (2 HOURS BEFORE PROCEDURE).   MEDICATION INSTRUCTIONS  Unless otherwise instructed, you should take regular prescription medications with a small sip of water   as early as possible the morning of your  procedure.           OTHER INSTRUCTIONS  You will need a responsible adult at least 52 years of age to accompany you and drive you home.   This person must remain in the waiting room during your procedure.  Wear loose fitting clothing that is easily removed.  Leave jewelry and other valuables at home.  However, you may wish to bring a book to read or  an iPod/MP3 player to listen to music as you wait for your procedure to start.  Remove all body piercing jewelry and leave at home.  Total time from sign-in until discharge is approximately 2-3 hours.  You should go home directly after your procedure and rest.  You can resume normal activities the  day after your procedure.  The day of your procedure you should not:   Drive   Make legal decisions   Operate machinery   Drink alcohol   Return to work  You will receive specific instructions about eating, activities and medications before you leave.    The above instructions have been reviewed and explained to me by   Ezra Sites RN  December 22, 2009 1:44 PM    I fully understand and  can verbalize these instructions _____________________________ Date _________

## 2010-08-11 NOTE — Procedures (Signed)
Summary: Colonoscopy  Patient: Sylvia Davies Note: All result statuses are Final unless otherwise noted.  Tests: (1) Colonoscopy (COL)   COL Colonoscopy           DONE     [facility]     [address]     [place], [place]  [address]           COLONOSCOPY PROCEDURE REPORT           PATIENT:  Sylvia Davies, Sylvia Davies  MR#:  [id number]     BIRTHDATE:  1960-12-21, 48 yrs. old  GENDER:  female     ENDOSCOPIST:  Wilhemina Bonito. Eda Keys, MD     REF. BY:  Screening Recall     PROCEDURE DATE:  01/01/2010     PROCEDURE:  Average-risk screening colonoscopy     G0121     ASA CLASS:  Class II     INDICATIONS:  Routine Risk Screening     MEDICATIONS:   Fentanyl 75 mcg IV, Versed 7 mg IV           DESCRIPTION OF PROCEDURE:   After the risks benefits and     alternatives of the procedure were thoroughly explained, informed     consent was obtained.  Digital rectal exam was performed and     revealed no abnormalities.   The LB [id number] endoscope     was introduced through the anus and advanced to the cecum, which     was identified by both the appendix and ileocecal valve, without     limitations.Time to cecum = 3:10 min. The quality of the prep was     excellent, using MoviPrep.  The instrument was then slowly     withdrawn (time = 9:54 min) as the colon was fully examined.     <<PROCEDUREIMAGES>>           FINDINGS:  Mild diverticulosis was found in the sigmoid colon.     This was otherwise a normal examination of the colon.  No polyps or     cancers were seen.   Retroflexed views in the rectum revealed no     abnormalities.    The scope was then withdrawn from the patient     and the procedure completed.           COMPLICATIONS:  None     ENDOSCOPIC IMPRESSION:     1) Mild diverticulosis in the sigmoid colon     2) Otherwise normal examination     3) No polyps or cancers           RECOMMENDATIONS:     1) Continue current colorectal screening recommendations for     "routine  risk" patients with a repeat colonoscopy in 10 years.           ______________________________     Wilhemina Bonito. Eda Keys, MD           CC:  Corwin Levins, MD;The Patient           n.     Rosalie DoctorWilhemina Bonito. Eda Keys at 01/01/2010 09:30 AM           Leland Johns, [phone]  Note: An exclamation mark (!) indicates a result that was not dispersed into the flowsheet. Document Creation Date: 01/01/2010 9:31 AM _______________________________________________________________________  (1) Order result status: Final Collection or observation date-time: 01/01/2010 09:24 Requested date-time:  Receipt date-time:  Reported date-time:  Referring Physician:  Ordering Physician: Fransico Setters [phone]) Specimen Source:  Source: Launa Grill Order Number: [id number] Lab site:   Appended Document: Colonoscopy     Procedures Next Due Date:    Colonoscopy: 01/2020

## 2010-08-11 NOTE — Miscellaneous (Signed)
Summary: Orders Update  Clinical Lists Changes  Problems: Added new problem of DISORDER OF BONE AND CARTILAGE UNSPECIFIED (ICD-733.90) Orders: Added new Referral order of Radiology Referral (Radiology) - Signed

## 2010-08-11 NOTE — Letter (Signed)
Summary: [facility]  [facility]   Imported By: Sherian Rein 03/05/2010 14:50:11  _____________________________________________________________________  External Attachment:    Type:   Image     Comment:   External Document

## 2010-08-11 NOTE — Assessment & Plan Note (Signed)
Summary: CPX/BCBS/$50/PN   Vital Signs:  Patient Profile:   50 Years Old Female Height:     62 inches Weight:      174.6 pounds O2 Sat:      94 % O2 treatment:    Room Air Temp:     99.0 degrees F oral Pulse rate:   69 / minute BP sitting:   116 / 70  (left arm) Cuff size:   regular  Vitals Entered By: Payton Spark CMA (May 06, 2008 10:38 AM)                 Preventive Care Screening  Last Flu Shot:    Date:  05/06/2008    Results:  declined  Last Tetanus Booster:    Next Due:  05/2015  Colonoscopy:    Next Due:  04/2011  Mammogram:    Date:  10/19/2007    Results:  normal   Pap Smear:    Date:  10/11/2007    Results:  normal    Chief Complaint:  cpx (bp106/75 on 04/27/08).  History of Present Illness: wt down one lb form 6/09; overall doing ok; some slight ST yesterday now better, no fever    Updated Prior Medication List: ECOTRIN 325 MG  TBEC (ASPIRIN) 1 by mouth qd DITROPAN 5 MG  TABS (OXYBUTYNIN CHLORIDE) 1 by mouth qh prn BENAZEPRIL HCL 10 MG  TABS (BENAZEPRIL HCL) 1 by mouth once daily AMLODIPINE BESYLATE 5 MG  TABS (AMLODIPINE BESYLATE) 1 by mouth once daily  Current Allergies (reviewed today): ! PCN ! * HYOSCYAMINE ! * TRAMADOL ! * BENAZEPRIL ! Sylvia Davies  Past Medical History:    Reviewed history from 04/02/2007 and no changes required:       Fibrocystic Breast Disease       Migraine Headaches       Obesity       IBS       Mild Mitral Regurgitation       Allergic rhinitis       GERD       Hyperlipidemia       Low back pain  Past Surgical History:    Reviewed history from 04/02/2007 and no changes required:       R eye surgery       Breast Lump Biopsy- 1996, negative       Leg Fracture   Family History:    Reviewed history from 05/04/2007 and no changes required:       mother -dwarfism       Family History Hypertension       grandmother with MI  Social History:    Reviewed history from 05/04/2007 and no changes  required:       Never Smoked       Alcohol use-no       Married       2 children       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   Risk Factors:  Mammogram History:     Date of Last Mammogram:  10/19/2007    Results:  normal   PAP Smear History:     Date of Last PAP Smear:  10/11/2007    Results:  normal    Review of Systems  The patient denies anorexia, fever, weight loss, weight gain, vision loss, decreased hearing, hoarseness, chest pain, syncope, dyspnea on exertion, peripheral edema, prolonged cough, headaches, hemoptysis, abdominal pain, melena, hematochezia, severe indigestion/heartburn, hematuria, incontinence, muscle weakness, suspicious skin lesions, transient  blindness, difficulty walking, depression, unusual weight change, abnormal bleeding, enlarged lymph nodes, angioedema, and breast masses.         all otherwise negative    Physical Exam  General:     alert and overweight-appearing.   Head:     Normocephalic and atraumatic without obvious abnormalities. No apparent alopecia or balding. Eyes:     No corneal or conjunctival inflammation noted. EOMI. Perrla. Funduscopic exam benign, without hemorrhages, exudates or papilledema. Vision grossly normal. Ears:     External ear exam shows no significant lesions or deformities.  Otoscopic examination reveals clear canals, tympanic membranes are intact bilaterally without bulging, retraction, inflammation or discharge. Hearing is grossly normal bilaterally. Nose:     External nasal examination shows no deformity or inflammation. Nasal mucosa are pink and moist without lesions or exudates. Mouth:     pharyngeal erythema and fair dentition.   Neck:     supple and full ROM.   Lungs:     Normal respiratory effort, chest expands symmetrically. Lungs are clear to auscultation, no crackles or wheezes. Heart:     Normal rate and regular rhythm. S1 and S2 normal without gallop, murmur, click, rub or other extra  sounds. Abdomen:     Bowel sounds positive,abdomen soft and non-tender without masses, organomegaly or hernias noted. Msk:     no joint tenderness and no joint swelling.   Extremities:     no edema, no ulcers  Neurologic:     cranial nerves II-XII intact and strength normal in all extremities.      Impression & Recommendations:  Problem # 1:  Preventive Health Care (ICD-V70.0) Overall doing well, up to date, counseled on routine health concerns for screening and prevention, immunizations up to date or declined, labs reviewed, ecg reviewed   Orders: EKG w/ Interpretation (93000)   Problem # 2:  URI (ICD-465.9)  Her updated medication list for this problem includes:    Ecotrin 325 Mg Tbec (Aspirin) .[name]... 1 by mouth qd mild viral, ok to follow  Problem # 3:  HYPERLIPIDEMIA (ICD-272.4)  Labs Reviewed: Chol: 181 (04/24/2008)   HDL: 41.9 (04/24/2008)   LDL: 127 (04/24/2008)   TG: 61 (04/24/2008) SGOT: 19 (04/24/2008)   SGPT: 15 (04/24/2008) d/w pt -to follow low chol diet  Complete Medication List: 1)  Ecotrin 325 Mg Tbec (Aspirin) .[name].. 1 by mouth qd 2)  Ditropan 5 Mg Tabs (Oxybutynin chloride) .[name].. 1 by mouth qh prn 3)  Benazepril Hcl 10 Mg Tabs (Benazepril hcl) .[name].. 1 by mouth once daily 4)  Amlodipine Besylate 5 Mg Tabs (Amlodipine besylate) .[name].. 1 by mouth once daily   Patient Instructions: 1)  Continue all medications that you may have been taking previously 2)  please follow low chol diet 3)  you can take Mucinex OTC for congestion, and advil OTC as needed for aches and pains 4)  please try to get more excercise and lose wt 5)  Please schedule a follow-up appointment in 1 year or sooner if needed   Prescriptions: AMLODIPINE BESYLATE 5 MG  TABS (AMLODIPINE BESYLATE) 1 by mouth once daily  #90 x 3   Entered by:   Payton Spark CMA   Authorized by:   Corwin Levins MD   Signed by:   Payton Spark CMA on 05/06/2008   Method used:   [facility]        [facility]   [address] [facility] [address] [phone]* (retail)       [address]  [place], [place]  [address]       Ph: [phone] or [phone]       Fax: [phone]   RxID:   [id number] BENAZEPRIL HCL 10 MG  TABS (BENAZEPRIL HCL) 1 by mouth once daily  #90 x 3   Entered by:   Payton Spark CMA   Authorized by:   Corwin Levins MD   Signed by:   Payton Spark CMA on 05/06/2008   Method used:   [facility]        [facility] [address] [phone]* (retail)       [address]       [place], [place]  [address]       Ph: [phone] or [phone]       Fax: [phone]   RxID:   [id number] DITROPAN 5 MG  TABS (OXYBUTYNIN CHLORIDE) 1 by mouth qh prn  #90 x 3   Entered by:   Payton Spark CMA   Authorized by:   Corwin Levins MD   Signed by:   Payton Spark CMA on 05/06/2008   Method used:   [facility] to        [facility] [address] [phone]* (retail)       [address]       [place], [place]  [address]       Ph: [phone] or [phone]       Fax: [phone]   RxID:   [id number]  ]

## 2010-08-13 NOTE — Miscellaneous (Signed)
Summary: Orders Update  Clinical Lists Changes  Orders: Added new Service order of EKG w/ Interpretation (93000) - Signed 

## 2010-08-13 NOTE — Assessment & Plan Note (Signed)
Summary: cpx-lb   Vital Signs:  Patient profile:   50 year old female Height:      62 inches Weight:      169 pounds BMI:     31.02 O2 Sat:      98 % on Room air Temp:     97.9 degrees F oral Pulse rate:   78 / minute BP sitting:   140 / 92  (left arm) Cuff size:   regular  Vitals Entered By: Zella Ball Ewing CMA Duncan Dull) (July 03, 2010 10:38 AM)  O2 Flow:  Room air  Preventive Care Screening  Pap Smear:    Date:  11/05/2009    Next Due:  11/2010    Results:  normal   Mammogram:    Next Due:  11/2010  CC: Adult Physical/RE   Primary Care Provider:  Corwin Levins MD  CC:  Adult Physical/RE.  History of Present Illness: here for wellness and f/u;  overall doing ok, incidently has quite painful tooth (molar left lower jaw) which is quite painful, worse to tough, needs root canal) but first appt she could get was jan 3;  OTC meds not working such as Hospital doctor and tylenol for pain;  no fever, sweling, d/c or drainage;  Pt denies CP, worsening sob, doe, wheezing, orthopnea, pnd, worsening LE edema, palps, dizziness or syncope  Pt denies new neuro symptoms such as headache, facial or extremity weakness  Pt denies polydipsia, polyuria,  Overall good compliance with meds, trying to follow low chol diet, wt coming down slowy with wt watcher program, and trying to walk about 1.5 miles 3-4 times per wk..  BP at the drug store always < 140/90.  No fever, wt loss, night sweats, loss of appetite or other constitutional symptoms  Overall good compliance with meds, and good tolerability.  Denies worsening depressive symptoms, suicidal ideation, or panic.   Pt states good ability with ADL's, low fall risk, home safety reviewed and adequate, no significant change in hearing or vision.    Problems Prior to Update: 1)  Unspecified Disorder Teeth&supporting Structures  (ICD-525.9) 2)  Disorder of Bone and Cartilage Unspecified  (ICD-733.90) 3)  Ankle Pain, Left  (ICD-719.47) 4)  Pharyngitis-acute   (ICD-462) 5)  Leg Pain, Bilateral  (ICD-729.5) 6)  Unspecified Pruritic Disorder  (ICD-698.9) 7)  Uri  (ICD-465.9) 8)  Preventive Health Care  (ICD-V70.0) 9)  Rash-nonvesicular  (ICD-782.1) 10)  Hypertension, Benign Essential  (ICD-401.1) 11)  Preventive Health Care  (ICD-V70.0) 12)  Low Back Pain  (ICD-724.2) 13)  Hyperlipidemia  (ICD-272.4) 14)  Gerd  (ICD-530.81) 15)  Allergic Rhinitis  (ICD-477.9) 16)  Mitral Regurgitation, 0 (MILD)  (ICD-396.3) 17)  Irritable Bowel Syndrome  (ICD-564.1) 18)  Obesity  (ICD-278.00) 19)  Breast Biopsy, Hx of  (ICD-V15.9) 20)  Migraine Headache  (ICD-346.90) 21)  Fibrocystic Breast Disease, Hx of  (ICD-V13.9)  Medications Prior to Update: 1)  Ecotrin 325 Mg  Tbec (Aspirin) .[name].. 1 By Mouth Qd 2)  Benazepril Hcl 20 Mg Tabs (Benazepril Hcl) .[name].. 1 By Mouth Once Daily 3)  Black Cohosh 40 Mg Caps (Black Cohosh) .[name].. 1 By Mouth Three Times A Day With Each Meal 4)  Tramadol Hcl 50 Mg Tabs (Tramadol Hcl) .[name].. 1po Four Time Per Day As Needed For Pain 5)  Fluconazole 150 Mg Tabs (Fluconazole) .[name].. 1 By Mouth Q 3 Days As Needed 6)  Nystatin 100000 Unit/ml Susp (Nystatin) .[name].. 1 Tsp Swish and Swallow X 7 Days, Then As  Needed 7)  Benzonatate 100 Mg Caps (Benzonatate) .[name].. 1 By Mouth Three Times A Day X 7 Days, Then As Needed Cough 8)  Prilosec Otc 20 Mg Tbec (Omeprazole Magnesium) .[name].. 1 By Mouth Once Daily X 7 Days, Then As Needed 9)  Claritin-D 12 Hour 5-120 Mg Xr12h-Tab (Loratadine-Pseudoephedrine) .[name].. 1 By Mouth  Every Morning X 7 Days, Then As Needed 10)  Hydrocodone-Acetaminophen 5-325 Mg Tabs (Hydrocodone-Acetaminophen) .[name].. 1po Q 6 Hrs As Needed Pain  Current Medications (verified): 1)  Ecotrin 325 Mg  Tbec (Aspirin) .[name].. 1 By Mouth Qd 2)  Benazepril Hcl 20 Mg Tabs (Benazepril Hcl) .[name].. 1 By Mouth Once Daily 3)  Black Cohosh 40 Mg Caps (Black Cohosh) .[name].. 1 By Mouth Three Times A Day With Each Meal 4)  Tramadol Hcl 50 Mg Tabs (Tramadol Hcl) .[name].. 1-2  Po Four Time Per Day As Needed For Pain 5)  Claritin-D 12 Hour 5-120 Mg Xr12h-Tab (Loratadine-Pseudoephedrine) .[name].. 1 By Mouth  Every Morning X 7 Days, Then As Needed  Allergies (verified): 1)  ! Pcn 2)  ! * Hyoscyamine 3)  ! * Tramadol 4)  ! * Benazepril 5)  ! * Alleve 6)  ! 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         all otherwise negative per pt -    Physical Exam  General:  alert and well-developed.   Head:  normocephalic and atraumatic.   Eyes:  vision grossly intact, pupils equal, and pupils round.   Ears:  R ear normal and L ear normal.   Nose:  no external deformity and no nasal discharge.   Mouth:  no gingival abnormalities and pharynx pink and moist., left lower jaw molar not obviously fracture, no bleeding, swelling or other drainage, gum nontender Neck:  supple and no masses.   Lungs:  normal  respiratory effort and normal breath sounds.   Heart:  normal rate and regular rhythm.   Abdomen:  soft, non-tender, and normal bowel sounds.   Msk:  no joint tenderness and no joint swelling.   Extremities:  no edema, no erythema  Neurologic:  strength normal in all extremities, sensation intact to light touch, and gait normal.   Skin:  color normal and no rashes.   Psych:  not depressed appearing and slightly anxious.     Impression & Recommendations:  Problem # 1:  Preventive Health Care (ICD-V70.0) Overall doing well, age appropriate education and counseling updated, referral for preventive services and immunizations addressed, dietary counseling and smoking status adressed , most recent labs reviewed I have personally reviewed and have noted 1.The patient's medical and social history 2.Their use of alcohol, tobacco or illicit drugs 3.Their current medications  and supplements 4. Functional ability including ADL's, fall risk, home safety risk, hearing & visual impairment  5.Diet and physical activities 6.Evidence for depression or mood disorders The patients weight, height, BMI  have been recorded in the chart I have made referrals, counseling and provided education to the patient based review of the above   Problem # 2:  HYPERLIPIDEMIA (ICD-272.4)  Labs Reviewed: SGOT: 20 (06/25/2010)   SGPT: 13 (06/25/2010)  Prior 10 Yr Risk Heart Disease: 8 % (04/25/2009)   HDL:45.00 (06/25/2010), 45.10 (04/25/2009)  LDL:127 (04/24/2008), DEL (04/24/2007)  Chol:212 (06/25/2010), 205 (04/25/2009)  Trig:108.0 (06/25/2010), 69.0 (04/25/2009) d/w pt - delcines statin, for better diet   Problem # 3:  UNSPECIFIED DISORDER TEETH&SUPPORTING STRUCTURES (ICD-525.9) with left lower tooth pain - tx with tramadol as needed, to f/u with dental as planned  Problem # 4:  GERD (ICD-530.81)  The following medications were removed from the medication list:    Prilosec Otc 20 Mg Tbec (Omeprazole magnesium)  .[name]... 1 by mouth once daily x 7 days, then as needed  Labs Reviewed: Hgb: 13.5 (06/25/2010)   Hct: 40.1 (06/25/2010) stable overall by hx and exam, ok to continue meds/tx as is   Problem # 5:  HYPERTENSION, BENIGN ESSENTIAL (ICD-401.1)  Her updated medication list for this problem includes:    Benazepril Hcl 20 Mg Tabs (Benazepril hcl) .[name]... 1 by mouth once daily  BP today: 140/92 Prior BP: 120/80 (02/18/2010)  Prior 10 Yr Risk Heart Disease: 8 % (04/25/2009)  Labs Reviewed: K+: 3.9 (06/25/2010) Creat: : 0.6 (06/25/2010)   Chol: 212 (06/25/2010)   HDL: 45.00 (06/25/2010)   LDL: 127 (04/24/2008)   TG: 108.0 (06/25/2010) stable overall by hx and exam, ok to continue meds/tx as is   Complete Medication List: 1)  Ecotrin 325 Mg Tbec (Aspirin) .[name].. 1 by mouth qd 2)  Benazepril Hcl 20 Mg Tabs (Benazepril hcl) .[name].. 1 by mouth once daily 3)  Black Cohosh 40 Mg Caps (Black cohosh) .[name].. 1 by mouth three times a day with each meal 4)  Tramadol Hcl 50 Mg Tabs (Tramadol hcl) .[name].. 1-2 po four time per day as needed for pain 5)  Claritin-d 12 Hour 5-120 Mg Xr12h-tab (Loratadine-pseudoephedrine) .[name].. 1 by mouth  every morning x 7 days, then as needed  Other Orders: Flu Vaccine 95yrs + (27253) Admin 1st Vaccine (66440)  Patient Instructions: 1)  you had the flu shot today 2)  Please take all new medications as prescribed - the pain medication 3)  Continue all previous medications as before this visit 4)  Please schedule a follow-up appointment in 1 year, or sooner if needed Prescriptions: TRAMADOL HCL 50 MG TABS (TRAMADOL HCL) 1-2 po four time per day as needed for pain  #120 x 0   Entered and Authorized by:   Corwin Levins MD   Signed by:   Corwin Levins MD on 07/03/2010   Method used:   Print then Give to Patient   RxID:   [id number] BENAZEPRIL HCL 20 MG TABS (BENAZEPRIL HCL) 1 by mouth once daily  #90 x 3   Entered and Authorized by:   Corwin Levins MD   Signed by:   Corwin Levins MD  on 07/03/2010   Method used:   Print then Give to Patient   RxID:   [id number] TRAMADOL HCL 50 MG TABS (TRAMADOL HCL) 1-2 po four time per day as needed for pain  #120 x 0   Entered and Authorized  by:   Corwin Levins MD   Signed by:   Corwin Levins MD on 07/03/2010   Method used:   [facility] to        [facility] [address] [phone]* (retail)       [address]       [place], [place]  [address]       Ph: [phone] or [phone]       Fax: [phone]   RxID:   [id number] BENAZEPRIL HCL 20 MG TABS (BENAZEPRIL HCL) 1 by mouth once daily  #90 x 3   Entered and Authorized by:   Corwin Levins MD   Signed by:   Corwin Levins MD on 07/03/2010   Method used:   [facility] to        [facility] [address] [phone]* (retail)       [address]       [place], [place]  [address]       Ph: [phone] or [phone]       Fax: [phone]   RxID:   [id number]    Orders Added: 1)  Flu Vaccine 70yrs + [37628] 2)  Admin 1st Vaccine [90471] 3)  Est. Patient 40-64 years [99396]   Immunizations Administered:  Influenza Vaccine # 1:    Vaccine Type: Fluvax 3+    Site: left deltoid    Mfr: Sanofi Pasteur    Dose: 0.5 ml    Route: IM    Given by: Zella Ball Ewing CMA (AAMA)    Exp. Date: 01/09/2011    Lot #: [id number]    VIS given: 02/03/10 version given July 03, 2010.  Flu Vaccine Consent Questions:    Do you have a history of severe allergic reactions to this vaccine? no    Any prior history of allergic reactions to egg and/or gelatin? no    Do you have a sensitivity to the preservative Thimersol? no    Do you have a past history of Guillan-Barre Syndrome? no    Do you currently have an acute febrile illness? no    Have you ever had a severe reaction to latex? no    Vaccine information given and explained to patient? yes    Are you currently pregnant? no   Immunizations Administered:  Influenza Vaccine # 1:     Vaccine Type: Fluvax 3+    Site: left deltoid    Mfr: Sanofi Pasteur    Dose: 0.5 ml    Route: IM    Given by: Zella Ball Ewing CMA (AAMA)    Exp. Date: 01/09/2011    Lot #: [id number]    VIS given: 02/03/10 version given July 03, 2010.

## 2010-08-13 NOTE — Progress Notes (Signed)
  Phone Note Other Incoming   Request: Send information Summary of Call: Request for records received from [employer]. Request forwarded to [facility].  2009 to present-John

## 2010-09-28 ENCOUNTER — Other Ambulatory Visit: Payer: Self-pay | Admitting: Internal Medicine

## 2010-09-28 DIAGNOSIS — Z1231 Encounter for screening mammogram for malignant neoplasm of breast: Secondary | ICD-10-CM

## 2010-11-09 ENCOUNTER — Ambulatory Visit
Admission: RE | Admit: 2010-11-09 | Discharge: 2010-11-09 | Disposition: A | Discharge status: Home / Self Care | Payer: PRIVATE HEALTH INSURANCE | Source: Ambulatory Visit | Attending: Internal Medicine | Admitting: Internal Medicine

## 2010-11-09 DIAGNOSIS — Z1231 Encounter for screening mammogram for malignant neoplasm of breast: Secondary | ICD-10-CM

## 2011-03-19 ENCOUNTER — Telehealth: Payer: Self-pay

## 2011-03-19 DIAGNOSIS — Z0000 Encounter for general adult medical examination without abnormal findings: Secondary | ICD-10-CM

## 2011-03-19 NOTE — Telephone Encounter (Signed)
Put order in for labs. 

## 2011-04-18 ENCOUNTER — Other Ambulatory Visit: Payer: Self-pay | Admitting: Internal Medicine

## 2011-06-18 ENCOUNTER — Other Ambulatory Visit: Payer: Self-pay | Admitting: Internal Medicine

## 2011-06-18 ENCOUNTER — Other Ambulatory Visit (INDEPENDENT_AMBULATORY_CARE_PROVIDER_SITE_OTHER): Payer: PRIVATE HEALTH INSURANCE

## 2011-06-18 DIAGNOSIS — Z0000 Encounter for general adult medical examination without abnormal findings: Secondary | ICD-10-CM

## 2011-06-18 LAB — CBC WITH DIFFERENTIAL/PLATELET
Eosinophils Absolute: 0.2 10*3/uL (ref 0.0–0.7)
HCT: 37.6 % (ref 36.0–46.0)
Lymphs Abs: 2.2 10*3/uL (ref 0.7–4.0)
MCHC: 33.7 g/dL (ref 30.0–36.0)
MCV: 83.2 fl (ref 78.0–100.0)
Monocytes Absolute: 0.3 10*3/uL (ref 0.1–1.0)
Neutrophils Relative %: 49.2 % (ref 43.0–77.0)
Platelets: 232 10*3/uL (ref 150.0–400.0)
RDW: 14.1 % (ref 11.5–14.6)

## 2011-06-18 LAB — TSH: TSH: 0.88 u[IU]/mL (ref 0.35–5.50)

## 2011-06-18 LAB — HEPATIC FUNCTION PANEL
AST: 18 U/L (ref 0–37)
Albumin: 3.9 g/dL (ref 3.5–5.2)
Alkaline Phosphatase: 94 U/L (ref 39–117)
Total Bilirubin: 0.8 mg/dL (ref 0.3–1.2)

## 2011-06-18 LAB — URINALYSIS, ROUTINE W REFLEX MICROSCOPIC
Ketones, ur: NEGATIVE
Leukocytes, UA: NEGATIVE
Specific Gravity, Urine: 1.015 (ref 1.000–1.030)
Urobilinogen, UA: 0.2 (ref 0.0–1.0)
pH: 8.5 (ref 5.0–8.0)

## 2011-06-18 LAB — LIPID PANEL
Cholesterol: 204 mg/dL — ABNORMAL HIGH (ref 0–200)
VLDL: 15.2 mg/dL (ref 0.0–40.0)

## 2011-06-18 LAB — BASIC METABOLIC PANEL
BUN: 12 mg/dL (ref 6–23)
Calcium: 9.2 mg/dL (ref 8.4–10.5)
GFR: 123.11 mL/min (ref >60.00)
Potassium: 3.6 mEq/L (ref 3.5–5.1)
Sodium: 142 mEq/L (ref 135–145)

## 2011-06-29 ENCOUNTER — Encounter: Payer: Self-pay | Admitting: Internal Medicine

## 2011-06-29 DIAGNOSIS — Z0000 Encounter for general adult medical examination without abnormal findings: Secondary | ICD-10-CM | POA: Insufficient documentation

## 2011-07-07 ENCOUNTER — Ambulatory Visit (INDEPENDENT_AMBULATORY_CARE_PROVIDER_SITE_OTHER): Payer: BC Managed Care – PPO | Admitting: Internal Medicine

## 2011-07-07 ENCOUNTER — Encounter: Payer: Self-pay | Admitting: Internal Medicine

## 2011-07-07 VITALS — BP 140/90 | HR 80 | Temp 98.6°F | Ht 62.0 in | Wt 174.2 lb

## 2011-07-07 DIAGNOSIS — Z0000 Encounter for general adult medical examination without abnormal findings: Secondary | ICD-10-CM

## 2011-07-07 DIAGNOSIS — I10 Essential (primary) hypertension: Secondary | ICD-10-CM

## 2011-07-07 DIAGNOSIS — E785 Hyperlipidemia, unspecified: Secondary | ICD-10-CM

## 2011-07-07 NOTE — Assessment & Plan Note (Signed)
stable overall by hx and exam, most recent data reviewed with pt, and pt to continue medical treatment as before  BP Readings from Last 3 Encounters:  07/07/11 140/90  07/03/10 140/92  02/18/10 120/80

## 2011-07-07 NOTE — Progress Notes (Signed)
Subjective:    Patient ID: Sylvia Davies, female    DOB: 1960/09/21, 50 y.o.   MRN: [id number]  HPI  Here for wellness and f/u;  Overall doing ok;  Pt denies CP, worsening SOB, DOE, wheezing, orthopnea, PND, worsening LE edema, palpitations, dizziness or syncope.  Pt denies neurological change such as new Headache, facial or extremity weakness.  Pt denies polydipsia, polyuria, or low sugar symptoms. Pt states overall good compliance with treatment and medications, good tolerability, and trying to follow lower cholesterol diet.  Pt denies worsening depressive symptoms, suicidal ideation or panic. No fever, wt loss, night sweats, loss of appetite, or other constitutional symptoms.  Pt states good ability with ADL's, low fall risk, home safety reviewed and adequate, no significant changes in hearing or vision, and occasionally active with exercise. C/o late day fatigue but o/w not hypersomnolent. Past Medical History  Diagnosis Date  . MITRAL REGURGITATION, 0 (MILD) 04/02/2007  . HYPERTENSION, BENIGN ESSENTIAL 05/04/2007  . HYPERLIPIDEMIA 04/02/2007  . GERD 04/02/2007  . ALLERGIC RHINITIS 04/02/2007  . OBESITY 04/02/2007  . MIGRAINE HEADACHE 04/02/2007  . Irritable bowel syndrome 04/02/2007  . FIBROCYSTIC BREAST DISEASE, HX OF 04/02/2007  . BREAST BIOPSY, HX OF 04/02/2007   No past surgical history on file.  reports that she has never smoked. She does not have any smokeless tobacco history on file. Her alcohol and drug histories not on file. family history is not on file. Allergies  Allergen Reactions  . Amlodipine Besy-Benazepril Hcl     REACTION: itch and sore throat  . Hyoscyamine   . Penicillins   . Tramadol    Current Outpatient Prescriptions on File Prior to Visit  Medication Sig Dispense Refill  . benazepril (LOTENSIN) 20 MG tablet 1 BY MOUTH ONCE DAILY  30 tablet  3   Review of Systems Review of Systems  Constitutional: Negative for diaphoresis, activity change, appetite change and  unexpected weight change.  HENT: Negative for hearing loss, ear pain, facial swelling, mouth sores and neck stiffness.   Eyes: Negative for pain, redness and visual disturbance.  Respiratory: Negative for shortness of breath and wheezing.   Cardiovascular: Negative for chest pain and palpitations.  Gastrointestinal: Negative for diarrhea, blood in stool, abdominal distention and rectal pain.  Genitourinary: Negative for hematuria, flank pain and decreased urine volume.  Musculoskeletal: Negative for myalgias and joint swelling.  Skin: Negative for color change and wound.  Neurological: Negative for syncope and numbness.  Hematological: Negative for adenopathy.  Psychiatric/Behavioral: Negative for hallucinations, self-injury, decreased concentration and agitation.      Objective:   Physical Exam BP 140/90  Pulse 80  Temp(Src) 98.6 F (37 C) (Oral)  Ht 5\' 2"  (1.575 m)  Wt 174 lb 4 oz (79.039 kg)  BMI 31.87 kg/m2  SpO2 98%  LMP 06/12/2011 Physical Exam  VS noted Constitutional: Pt is oriented to person, place, and time. Appears well-developed and well-nourished.  HENT:  Head: Normocephalic and atraumatic.  Right Ear: External ear normal.  Left Ear: External ear normal.  Nose: Nose normal.  Mouth/Throat: Oropharynx is clear and moist.  Eyes: Conjunctivae and EOM are normal. Pupils are equal, round, and reactive to light.  Neck: Normal range of motion. Neck supple. No JVD present. No tracheal deviation present.  Cardiovascular: Normal rate, regular rhythm, normal heart sounds and intact distal pulses.   Pulmonary/Chest: Effort normal and breath sounds normal.  Abdominal: Soft. Bowel sounds are normal. There is no tenderness.  Musculoskeletal: Normal range of  motion. Exhibits no edema.  Lymphadenopathy:  Has no cervical adenopathy.  Neurological: Pt is alert and oriented to person, place, and time. Pt has normal reflexes. No cranial nerve deficit.  Skin: Skin is warm and dry. No  rash noted.  Psychiatric:  Has  normal mood and affect. Behavior is normal.     Assessment & Plan:

## 2011-07-07 NOTE — Patient Instructions (Addendum)
Your EKG and lab work were good today Please follow lower cholesterol diet Continue all other medications as before Please continue to work on more exercise and activity, less calories and weight loss Please check your Blood Pressure on a regular basis and return sooner if your Blood Pressure is usually > 140/90 Please remember to followup with your GYN for the yearly pap smear and/or mammogram, as you do Please return in 1 year for your yearly visit, or sooner if needed, with Lab testing done 3-5 days before

## 2011-07-07 NOTE — Assessment & Plan Note (Signed)
Mild elev, d/w pt  - for lower chol diet, declines statin  Lab Results  Component Value Date   LDLCALC 127* 04/24/2008

## 2011-07-07 NOTE — Assessment & Plan Note (Signed)

## 2011-07-08 ENCOUNTER — Encounter: Payer: Self-pay | Admitting: Internal Medicine

## 2011-07-14 ENCOUNTER — Ambulatory Visit (INDEPENDENT_AMBULATORY_CARE_PROVIDER_SITE_OTHER): Payer: Self-pay

## 2011-07-14 DIAGNOSIS — R197 Diarrhea, unspecified: Secondary | ICD-10-CM

## 2011-07-22 ENCOUNTER — Ambulatory Visit: Payer: Self-pay

## 2011-08-06 ENCOUNTER — Ambulatory Visit: Payer: Self-pay

## 2011-08-10 ENCOUNTER — Ambulatory Visit: Payer: Self-pay

## 2011-08-12 ENCOUNTER — Ambulatory Visit: Payer: Self-pay

## 2011-08-15 ENCOUNTER — Other Ambulatory Visit: Payer: Self-pay | Admitting: Internal Medicine

## 2011-09-22 ENCOUNTER — Other Ambulatory Visit: Payer: Self-pay | Admitting: Internal Medicine

## 2011-09-22 ENCOUNTER — Other Ambulatory Visit: Payer: Self-pay

## 2011-09-22 DIAGNOSIS — Z1231 Encounter for screening mammogram for malignant neoplasm of breast: Secondary | ICD-10-CM

## 2011-09-22 MED ORDER — BENAZEPRIL HCL 20 MG PO TABS: 20.0000 mg | ORAL_TABLET | Freq: Every day | ORAL | Status: DC

## 2011-11-11 ENCOUNTER — Encounter: Payer: Self-pay | Admitting: Internal Medicine

## 2011-11-11 ENCOUNTER — Ambulatory Visit (INDEPENDENT_AMBULATORY_CARE_PROVIDER_SITE_OTHER): Payer: BC Managed Care – PPO | Admitting: Internal Medicine

## 2011-11-11 ENCOUNTER — Ambulatory Visit
Admission: RE | Admit: 2011-11-11 | Discharge: 2011-11-11 | Disposition: A | Discharge status: Home / Self Care | Payer: BC Managed Care – PPO | Source: Ambulatory Visit | Attending: Internal Medicine | Admitting: Internal Medicine

## 2011-11-11 VITALS — BP 120/82 | HR 71 | Temp 97.3°F | Ht 62.0 in | Wt 177.5 lb

## 2011-11-11 DIAGNOSIS — J309 Allergic rhinitis, unspecified: Secondary | ICD-10-CM

## 2011-11-11 DIAGNOSIS — J029 Acute pharyngitis, unspecified: Secondary | ICD-10-CM | POA: Insufficient documentation

## 2011-11-11 DIAGNOSIS — K219 Gastro-esophageal reflux disease without esophagitis: Secondary | ICD-10-CM

## 2011-11-11 DIAGNOSIS — Z1231 Encounter for screening mammogram for malignant neoplasm of breast: Secondary | ICD-10-CM

## 2011-11-11 DIAGNOSIS — I10 Essential (primary) hypertension: Secondary | ICD-10-CM

## 2011-11-11 MED ORDER — AZITHROMYCIN 250 MG PO TABS: ORAL_TABLET | ORAL | Status: AC

## 2011-11-11 NOTE — Assessment & Plan Note (Signed)
stable overall by hx and exam though she usually has seasonal flares, Continue all other medications as before, and otc allegra prn,  to f/u any worsening symptoms or concerns

## 2011-11-11 NOTE — Assessment & Plan Note (Signed)
stable overall by hx and exam,and pt to continue medical treatment as before such as OTC PPI

## 2011-11-11 NOTE — Assessment & Plan Note (Signed)
Mild to mod, for antibx course,  to f/u any worsening symptoms or concerns 

## 2011-11-11 NOTE — Progress Notes (Signed)
  Subjective:    Patient ID: Sylvia Davies, female    DOB: 25-Oct-1960, 51 y.o.   MRN: [id number]  HPI  Here to f/u -  Here with 3 days acute onset fever, pain, pressure, general weakness and malaise, and greenish d/c, with mod to severe ST, feels ill but little to no cough and Pt denies chest pain, increased sob or doe, wheezing, orthopnea, PND, increased LE swelling, palpitations, dizziness or syncope.  Pt denies new neurological symptoms such as new headache, or facial or extremity weakness or numbness   Pt denies polydipsia, polyuria.  Does seem to occur about 3-4 times per yr.  Drives school bus  Denies ongoing nasal allergy symptoms with clear congestion, itch and sneeze, though she usually does have flare of seaonsall allergy symptoms. Denies worsening reflux, dysphagia, abd pain, n/v, bowel change or blood.   Past Medical History  Diagnosis Date  . MITRAL REGURGITATION, 0 (MILD) 04/02/2007  . HYPERTENSION, BENIGN ESSENTIAL 05/04/2007  . HYPERLIPIDEMIA 04/02/2007  . GERD 04/02/2007  . ALLERGIC RHINITIS 04/02/2007  . OBESITY 04/02/2007  . MIGRAINE HEADACHE 04/02/2007  . Irritable bowel syndrome 04/02/2007  . FIBROCYSTIC BREAST DISEASE, HX OF 04/02/2007  . BREAST BIOPSY, HX OF 04/02/2007   No past surgical history on file.  reports that she has never smoked. She does not have any smokeless tobacco history on file. Her alcohol and drug histories not on file. family history is not on file. Allergies  Allergen Reactions  . Amlodipine Besy-Benazepril Hcl     REACTION: itch and sore throat  . Hyoscyamine   . Penicillins   . Tramadol    Review of Systems Review of Systems  Constitutional: Negative for diaphoresis and unexpected weight change.  No tinnitus.   Eyes: Negative for photophobia and visual disturbance.  Respiratory: Negative for choking and stridor.   Gastrointestinal: Negative for vomiting and blood in stool.  Genitourinary: Negative for hematuria and decreased urine volume.    Musculoskeletal: Negative for gait problem.  Skin: Negative for color change and wound.    Objective:   Physical Exam BP 120/82  Pulse 71  Temp(Src) 97.3 F (36.3 C) (Oral)  Ht 5\' 2"  (1.575 m)  Wt 177 lb 8 oz (80.513 kg)  BMI 32.46 kg/m2  SpO2 98%  LMP 10/29/2011 Physical Exam  VS noted, mil ill Constitutional: Pt appears well-developed and well-nourished.  HENT: Head: Normocephalic.  Right Ear: External ear normal.  Left Ear: External ear normal.  Bilat tm's mild erythema.  Sinus nontender.  Pharynx severe erythema Eyes: Conjunctivae and EOM are normal. Pupils are equal, round, and reactive to light.  Neck: Normal range of motion. Neck supple. Has bilat submandib LA  Cardiovascular: Normal rate and regular rhythm.   Pulmonary/Chest: Effort normal and breath sounds normal.  Neurological: Pt is alert. Not confused Skin: Skin is warm. No erythema.  Psychiatric: Pt behavior is normal. Thought content normal.     Assessment & Plan:

## 2011-11-11 NOTE — Assessment & Plan Note (Signed)
stable overall by hx and exam, most recent data reviewed with pt, and pt to continue medical treatment as before,  BP Readings from Last 3 Encounters:  11/11/11 120/82  07/07/11 140/90  07/03/10 140/92

## 2011-11-11 NOTE — Patient Instructions (Signed)
Take all new medications as prescribed Continue all other medications as before  

## 2011-11-17 ENCOUNTER — Telehealth: Payer: Self-pay

## 2011-11-17 MED ORDER — LEVOFLOXACIN 250 MG PO TABS: 250.0000 mg | ORAL_TABLET | Freq: Every day | ORAL | Status: AC

## 2011-11-17 NOTE — Telephone Encounter (Signed)
Called the patient informed antibiotic sent in. 

## 2011-11-17 NOTE — Telephone Encounter (Signed)
Pt called stating she is still having ST, sinus pressure and pain with green mucus. Pt is requesting a stronger ABX, please advise.

## 2011-11-17 NOTE — Telephone Encounter (Signed)
Done per emr 

## 2011-12-10 ENCOUNTER — Other Ambulatory Visit: Payer: Self-pay | Admitting: Internal Medicine

## 2011-12-10 MED ORDER — LEVOFLOXACIN 250 MG PO TABS: 250.0000 mg | ORAL_TABLET | Freq: Every day | ORAL | Status: AC

## 2011-12-10 NOTE — Telephone Encounter (Signed)
Pt seen may 2 for same  Allergies reviewed  For levaquin asd  For OV if not improved 3-4 days

## 2011-12-10 NOTE — Telephone Encounter (Signed)
Patient informed prescription sent in to pharmacy and to schedule OV if no better in 3-4 days.

## 2011-12-10 NOTE — Telephone Encounter (Signed)
Pt needing antibiotics--severe scratchy throat--[facility]

## 2012-01-07 ENCOUNTER — Ambulatory Visit (INDEPENDENT_AMBULATORY_CARE_PROVIDER_SITE_OTHER): Payer: Self-pay

## 2012-01-07 DIAGNOSIS — R197 Diarrhea, unspecified: Secondary | ICD-10-CM

## 2012-04-25 ENCOUNTER — Telehealth: Payer: Self-pay | Admitting: Internal Medicine

## 2012-04-25 NOTE — Telephone Encounter (Signed)
Caller: Sylvia Davies/Patient; Patient Name: Sylvia Davies; PCP: Oliver Barre (Adults only); Best Callback Phone Number: [phone] Pt calling wanting it noted in her chart that she has had an itching reaction now to Clindamycin given to her at the dentist. Took for close to 2 week and has not taken for over week now because of the itching. Denies sx now and declines triage. ONLY CALLING TO HAVE CLINDAMYCIN REACTION (ITCHING) NOTED IN CHART.

## 2012-04-25 NOTE — Telephone Encounter (Signed)
Cleocin with itching now noted on her allergies

## 2012-08-04 ENCOUNTER — Encounter: Payer: BC Managed Care – PPO | Admitting: Internal Medicine

## 2012-09-27 ENCOUNTER — Encounter: Payer: BC Managed Care – PPO | Admitting: Internal Medicine

## 2012-10-02 ENCOUNTER — Other Ambulatory Visit: Payer: Self-pay

## 2012-10-02 DIAGNOSIS — Z1231 Encounter for screening mammogram for malignant neoplasm of breast: Secondary | ICD-10-CM

## 2012-10-14 ENCOUNTER — Other Ambulatory Visit: Payer: Self-pay | Admitting: Internal Medicine

## 2012-10-16 ENCOUNTER — Ambulatory Visit (INDEPENDENT_AMBULATORY_CARE_PROVIDER_SITE_OTHER): Payer: BC Managed Care – PPO

## 2012-10-16 ENCOUNTER — Telehealth: Payer: Self-pay

## 2012-10-16 DIAGNOSIS — Z0000 Encounter for general adult medical examination without abnormal findings: Secondary | ICD-10-CM

## 2012-10-16 DIAGNOSIS — R7301 Impaired fasting glucose: Secondary | ICD-10-CM

## 2012-10-16 LAB — URINALYSIS, ROUTINE W REFLEX MICROSCOPIC
Bilirubin Urine: NEGATIVE
Ketones, ur: NEGATIVE
Specific Gravity, Urine: 1.01 (ref 1.000–1.030)
Urobilinogen, UA: 0.2 (ref 0.0–1.0)

## 2012-10-16 LAB — CBC WITH DIFFERENTIAL/PLATELET
Basophils Absolute: 0 10*3/uL (ref 0.0–0.1)
Eosinophils Relative: 1.8 % (ref 0.0–5.0)
HCT: 40 % (ref 36.0–46.0)
Lymphocytes Relative: 34.8 % (ref 12.0–46.0)
Lymphs Abs: 2 10*3/uL (ref 0.7–4.0)
Monocytes Relative: 5.6 % (ref 3.0–12.0)
Platelets: 233 10*3/uL (ref 150.0–400.0)
RDW: 14.5 % (ref 11.5–14.6)
WBC: 5.7 10*3/uL (ref 4.5–10.5)

## 2012-10-16 LAB — BASIC METABOLIC PANEL
BUN: 13 mg/dL (ref 6–23)
Creatinine, Ser: 0.7 mg/dL (ref 0.4–1.2)
GFR: 112.44 mL/min (ref >60.00)
Glucose, Bld: 113 mg/dL — ABNORMAL HIGH (ref 70–99)
Potassium: 4 mEq/L (ref 3.5–5.1)

## 2012-10-16 LAB — LIPID PANEL
HDL: 44 mg/dL (ref >39.00)
Total CHOL/HDL Ratio: 5
Triglycerides: 81 mg/dL (ref 0.0–149.0)

## 2012-10-16 LAB — TSH: TSH: 1.85 u[IU]/mL (ref 0.35–5.50)

## 2012-10-16 LAB — HEPATIC FUNCTION PANEL
AST: 17 U/L (ref 0–37)
Albumin: 3.7 g/dL (ref 3.5–5.2)

## 2012-10-16 LAB — LDL CHOLESTEROL, DIRECT: Direct LDL: 144.4 mg/dL

## 2012-10-16 NOTE — Telephone Encounter (Signed)
CPX labs entered  

## 2012-10-25 ENCOUNTER — Encounter: Payer: BC Managed Care – PPO | Admitting: Internal Medicine

## 2012-11-08 ENCOUNTER — Ambulatory Visit (INDEPENDENT_AMBULATORY_CARE_PROVIDER_SITE_OTHER): Payer: BC Managed Care – PPO | Admitting: Internal Medicine

## 2012-11-08 ENCOUNTER — Encounter: Payer: Self-pay | Admitting: Internal Medicine

## 2012-11-08 VITALS — BP 132/90 | HR 67 | Temp 98.4°F | Ht 62.0 in | Wt 178.4 lb

## 2012-11-08 DIAGNOSIS — R7309 Other abnormal glucose: Secondary | ICD-10-CM

## 2012-11-08 DIAGNOSIS — Z0000 Encounter for general adult medical examination without abnormal findings: Secondary | ICD-10-CM

## 2012-11-08 DIAGNOSIS — E785 Hyperlipidemia, unspecified: Secondary | ICD-10-CM

## 2012-11-08 DIAGNOSIS — L259 Unspecified contact dermatitis, unspecified cause: Secondary | ICD-10-CM | POA: Insufficient documentation

## 2012-11-08 DIAGNOSIS — I10 Essential (primary) hypertension: Secondary | ICD-10-CM

## 2012-11-08 DIAGNOSIS — R7302 Impaired glucose tolerance (oral): Secondary | ICD-10-CM

## 2012-11-08 HISTORY — DX: Impaired glucose tolerance (oral): R73.02

## 2012-11-08 MED ORDER — BENAZEPRIL HCL 20 MG PO TABS: ORAL_TABLET | ORAL | Status: DC

## 2012-11-08 MED ORDER — METHYLPREDNISOLONE ACETATE 80 MG/ML IJ SUSP
80.0000 mg | Freq: Once | INTRAMUSCULAR | Status: AC
  Administered 2012-11-08: 80 mg via INTRAMUSCULAR

## 2012-11-08 MED ORDER — ATORVASTATIN CALCIUM 10 MG PO TABS: 10.0000 mg | ORAL_TABLET | Freq: Every day | ORAL | Status: DC

## 2012-11-08 MED ORDER — TRIAMCINOLONE ACETONIDE 0.1 % EX CREA: TOPICAL_CREAM | Freq: Two times a day (BID) | CUTANEOUS | Status: DC

## 2012-11-08 NOTE — Assessment & Plan Note (Signed)
stable overall by history and exam, recent data reviewed with pt, and pt to continue medical treatment as before,  to f/u any worsening symptoms or concerns BP Readings from Last 3 Encounters:  11/08/12 132/90  11/11/11 120/82  07/07/11 140/90

## 2012-11-08 NOTE — Assessment & Plan Note (Signed)
Asympt,  Lab Results  Component Value Date   HGBA1C 5.9 10/16/2012

## 2012-11-08 NOTE — Progress Notes (Signed)
Subjective:    Patient ID: Sylvia Davies, female    DOB: 1960/11/23, 52 y.o.   MRN: [id number]  HPI  Here for wellness and f/u;  Overall doing ok;  Pt denies CP, worsening SOB, DOE, wheezing, orthopnea, PND, worsening LE edema, palpitations, dizziness or syncope.  Pt denies neurological change such as new headache, facial or extremity weakness.  Pt denies polydipsia, polyuria, or low sugar symptoms. Pt states overall good compliance with treatment and medications, good tolerability, and has been trying to follow lower cholesterol diet.  Pt denies worsening depressive symptoms, suicidal ideation or panic. No fever, night sweats, wt loss, loss of appetite, or other constitutional symptoms.  Pt states good ability with ADL's, has low fall risk, home safety reviewed and adequate, no other significant changes in hearing or vision, and only occasionally active with exercise.  Does have mult areas itchy rash after working in the yard for 6 days Past Medical History  Diagnosis Date  . MITRAL REGURGITATION, 0 (MILD) 04/02/2007  . HYPERTENSION, BENIGN ESSENTIAL 05/04/2007  . HYPERLIPIDEMIA 04/02/2007  . GERD 04/02/2007  . ALLERGIC RHINITIS 04/02/2007  . OBESITY 04/02/2007  . MIGRAINE HEADACHE 04/02/2007  . Irritable bowel syndrome 04/02/2007  . FIBROCYSTIC BREAST DISEASE, HX OF 04/02/2007  . BREAST BIOPSY, HX OF 04/02/2007  . Impaired glucose tolerance 11/08/2012   No past surgical history on file.  reports that she has never smoked. She does not have any smokeless tobacco history on file. Her alcohol and drug histories are not on file. family history is not on file. Allergies  Allergen Reactions  . Amlodipine Besy-Benazepril Hcl     REACTION: itch and sore throat  . Cleocin (Clindamycin Hcl) Itching  . Hyoscyamine   . Penicillins   . Tramadol    Current Outpatient Prescriptions on File Prior to Visit  Medication Sig Dispense Refill  . aspirin 81 MG tablet Take 81 mg by mouth daily.         No  current facility-administered medications on file prior to visit.   Review of Systems  Constitutional: Negative for unexpected weight change, or unusual diaphoresis  HENT: Negative for tinnitus.   Eyes: Negative for photophobia and visual disturbance.  Respiratory: Negative for choking and stridor.   Gastrointestinal: Negative for vomiting and blood in stool.  Genitourinary: Negative for hematuria and decreased urine volume.  Musculoskeletal: Negative for acute joint swelling Skin: Negative for color change and wound.  Neurological: Negative for tremors and numbness other than noted  Psychiatric/Behavioral: Negative for decreased concentration or  hyperactivity.       Objective:   Physical Exam BP 132/90  Pulse 67  Temp(Src) 98.4 F (36.9 C) (Oral)  Ht 5\' 2"  (1.575 m)  Wt 178 lb 6 oz (80.91 kg)  BMI 32.62 kg/m2  SpO2 97% VS noted,  Constitutional: Pt is oriented to person, place, and time. Appears well-developed and well-nourished.  Head: Normocephalic and atraumatic.  Right Ear: External ear normal.  Left Ear: External ear normal.  Nose: Nose normal.  Mouth/Throat: Oropharynx is clear and moist.  Eyes: Conjunctivae and EOM are normal. Pupils are equal, round, and reactive to light.  Neck: Normal range of motion. Neck supple. No JVD present. No tracheal deviation present.  Cardiovascular: Normal rate, regular rhythm, normal heart sounds and intact distal pulses.   Pulmonary/Chest: Effort normal and breath sounds normal.  Abdominal: Soft. Bowel sounds are normal. There is no tenderness. No HSM  Musculoskeletal: Normal range of motion. Exhibits no edema.  Lymphadenopathy:  Has no cervical adenopathy.  Neurological: Pt is alert and oriented to person, place, and time. Pt has normal reflexes. No cranial nerve deficit.  Skin: Skin is warm and dry. Several linear pruritic erythem areas noted to arms  Psychiatric:  Has  normal mood and affect. Behavior is normal.         Assessment & Plan:

## 2012-11-08 NOTE — Assessment & Plan Note (Signed)

## 2012-11-08 NOTE — Assessment & Plan Note (Signed)
Mild to mod, for depomedrol IM,  to f/u any worsening symptoms or concerns 

## 2012-11-08 NOTE — Patient Instructions (Addendum)
You had the steroid shot today Please take all new medication as prescribed  - the cream for the rash Please take all new medication as prescribed  - the lipitor for cholesterol Please continue all other medications as before, and refills have been done if requested. - the benazepril Please continue your efforts at being more active, low cholesterol diet, and weight control. You are otherwise up to date with prevention measures today. Please keep your appointments with your specialists as you have planned  - yearly GYN Please remember to sign up for My Chart if you have not done so, as this will be important to you in the future with finding out test results, communicating by private email, and scheduling acute appointments online when needed. Please return in 1 year for your yearly visit, or sooner if needed, with Lab testing done 3-5 days before

## 2012-11-08 NOTE — Assessment & Plan Note (Signed)
Uncontrolled, for lower chol idet, also lipitor 10 qd

## 2012-11-13 ENCOUNTER — Ambulatory Visit
Admission: RE | Admit: 2012-11-13 | Discharge: 2012-11-13 | Disposition: A | Discharge status: Home / Self Care | Payer: BC Managed Care – PPO | Source: Ambulatory Visit

## 2012-11-13 DIAGNOSIS — Z1231 Encounter for screening mammogram for malignant neoplasm of breast: Secondary | ICD-10-CM

## 2012-11-16 ENCOUNTER — Other Ambulatory Visit: Payer: Self-pay | Admitting: Internal Medicine

## 2012-12-05 ENCOUNTER — Other Ambulatory Visit (HOSPITAL_COMMUNITY)
Admission: RE | Admit: 2012-12-05 | Discharge: 2012-12-05 | Disposition: A | Discharge status: Home / Self Care | Payer: BC Managed Care – PPO | Source: Ambulatory Visit | Attending: Obstetrics and Gynecology | Admitting: Obstetrics and Gynecology

## 2012-12-05 ENCOUNTER — Other Ambulatory Visit: Payer: Self-pay | Admitting: Nurse Practitioner

## 2012-12-05 DIAGNOSIS — Z01419 Encounter for gynecological examination (general) (routine) without abnormal findings: Secondary | ICD-10-CM | POA: Insufficient documentation

## 2012-12-05 DIAGNOSIS — Z1151 Encounter for screening for human papillomavirus (HPV): Secondary | ICD-10-CM | POA: Insufficient documentation

## 2012-12-20 ENCOUNTER — Ambulatory Visit (INDEPENDENT_AMBULATORY_CARE_PROVIDER_SITE_OTHER): Payer: BC Managed Care – PPO | Admitting: Internal Medicine

## 2012-12-20 ENCOUNTER — Encounter: Payer: Self-pay | Admitting: Internal Medicine

## 2012-12-20 VITALS — BP 120/80 | HR 63 | Temp 99.3°F | Ht 62.0 in | Wt 172.2 lb

## 2012-12-20 DIAGNOSIS — R7309 Other abnormal glucose: Secondary | ICD-10-CM

## 2012-12-20 DIAGNOSIS — I10 Essential (primary) hypertension: Secondary | ICD-10-CM

## 2012-12-20 DIAGNOSIS — R7302 Impaired glucose tolerance (oral): Secondary | ICD-10-CM

## 2012-12-20 DIAGNOSIS — K649 Unspecified hemorrhoids: Secondary | ICD-10-CM | POA: Insufficient documentation

## 2012-12-20 DIAGNOSIS — E785 Hyperlipidemia, unspecified: Secondary | ICD-10-CM

## 2012-12-20 MED ORDER — HYDROCORTISONE ACETATE 25 MG RE SUPP: 25.0000 mg | Freq: Two times a day (BID) | RECTAL | Status: DC

## 2012-12-20 NOTE — Progress Notes (Signed)
Subjective:    Patient ID: Sylvia Davies, female    DOB: 1960/09/25, 52 y.o.   MRN: [id number]  HPI  Here with c/o rectal itching for 1 mo off and on, some minor discomfort, no bleeding, fever, swelling, abd pain and Denies worsening reflux,  dysphagia, n/v, or blood; has had a few loose stools over the month but minor, no wt loss, fever.  Pt denies chest pain, increased sob or doe, wheezing, orthopnea, PND, increased LE swelling, palpitations, dizziness or syncope.Pt denies new neurological symptoms such as new headache, or facial or extremity weakness or numbness.  Pt denies polydipsia, polyuria. Past Medical History  Diagnosis Date  . MITRAL REGURGITATION, 0 (MILD) 04/02/2007  . HYPERTENSION, BENIGN ESSENTIAL 05/04/2007  . HYPERLIPIDEMIA 04/02/2007  . GERD 04/02/2007  . ALLERGIC RHINITIS 04/02/2007  . OBESITY 04/02/2007  . MIGRAINE HEADACHE 04/02/2007  . Irritable bowel syndrome 04/02/2007  . FIBROCYSTIC BREAST DISEASE, HX OF 04/02/2007  . BREAST BIOPSY, HX OF 04/02/2007  . Impaired glucose tolerance 11/08/2012   No past surgical history on file.  reports that she has never smoked. She does not have any smokeless tobacco history on file. Her alcohol and drug histories are not on file. family history is not on file. Allergies  Allergen Reactions  . Amlodipine Besy-Benazepril Hcl     REACTION: itch and sore throat  . Cleocin (Clindamycin Hcl) Itching  . Hyoscyamine   . Penicillins   . Tramadol    Current Outpatient Prescriptions on File Prior to Visit  Medication Sig Dispense Refill  . aspirin 81 MG tablet Take 81 mg by mouth daily.        [name] atorvastatin (LIPITOR) 10 MG tablet Take 1 tablet (10 mg total) by mouth daily.  90 tablet  3  . benazepril (LOTENSIN) 20 MG tablet TAKE 1 TABLET (20 MG TOTAL) BY MOUTH DAILY.  90 tablet  3  . fluconazole (DIFLUCAN) 150 MG tablet TAKE 1 TABLET ONCE A DAY ORALLY  1 tablet  1  . triamcinolone cream (KENALOG) 0.1 % Apply topically 2 (two) times  daily.  30 g  0   No current facility-administered medications on file prior to visit.   Review of Systems  Constitutional: Negative for unexpected weight change, or unusual diaphoresis  HENT: Negative for tinnitus.   Eyes: Negative for photophobia and visual disturbance.  Respiratory: Negative for choking and stridor.   Gastrointestinal: Negative for vomiting and blood in stool.  Genitourinary: Negative for hematuria and decreased urine volume.  Musculoskeletal: Negative for acute joint swelling Skin: Negative for color change and wound.  Neurological: Negative for tremors and numbness other than noted  Psychiatric/Behavioral: Negative for decreased concentration or  hyperactivity.       Objective:   Physical Exam BP 120/80  Pulse 63  Temp(Src) 99.3 F (37.4 C) (Oral)  Ht 5\' 2"  (1.575 m)  Wt 172 lb 4 oz (78.132 kg)  BMI 31.5 kg/m2  SpO2 97% VS noted,  Constitutional: Pt appears well-developed and well-nourished.  HENT: Head: NCAT.  Right Ear: External ear normal.  Left Ear: External ear normal.  Eyes: Conjunctivae and EOM are normal. Pupils are equal, round, and reactive to light.  Neck: Normal range of motion. Neck supple.  Cardiovascular: Normal rate and regular rhythm.   Pulmonary/Chest: Effort normal and breath sounds normal.  Abd:  Soft, NT, non-distended, + BS, rectal deferred per pt Neurological: Pt is alert. Not confused  Skin: Skin is warm. No erythema.  Psychiatric: Pt behavior  is normal. Thought content normal.     Assessment & Plan:

## 2012-12-20 NOTE — Assessment & Plan Note (Signed)
stable overall by history and exam, recent data reviewed with pt, and pt to continue medical treatment as before,  to f/u any worsening symptoms or concerns Lab Results  Component Value Date   LDLCALC 127* 04/24/2008   For cont'd statin, declines further labs today, cont lower chol diet

## 2012-12-20 NOTE — Assessment & Plan Note (Signed)
Presumed vs pruritis ani vs other  - for anusol supp prn, consider topical proctosol if not helping, last colonscopy 2011, pt to call or return for any worsening s/s

## 2012-12-20 NOTE — Assessment & Plan Note (Signed)
stable overall by history and exam, recent data reviewed with pt, and pt to continue medical treatment as before,  to f/u any worsening symptoms or concerns Lab Results  Component Value Date   HGBA1C 5.9 10/16/2012

## 2012-12-20 NOTE — Patient Instructions (Signed)
Please take all new medication as prescribed - the suppositories Please continue all other medications as before Please have the pharmacy call with any other refills you may need.

## 2012-12-20 NOTE — Assessment & Plan Note (Signed)
stable overall by history and exam, recent data reviewed with pt, and pt to continue medical treatment as before,  to f/u any worsening symptoms or concerns BP Readings from Last 3 Encounters:  12/20/12 120/80  11/08/12 132/90  11/11/11 120/82

## 2013-01-17 ENCOUNTER — Telehealth: Payer: Self-pay | Admitting: *Deleted

## 2013-01-17 NOTE — Telephone Encounter (Signed)
Sorry, I dont have access to this, although she can request any medical record she wants to research this from Medical Record dept

## 2013-01-17 NOTE — Telephone Encounter (Signed)
Pt called requesting her BP levels prior to being prescribed Benazepril in 2008.  Please advise pt.

## 2013-01-18 NOTE — Telephone Encounter (Signed)
Left message to call back  

## 2013-01-18 NOTE — Telephone Encounter (Signed)
Pt informed of MD's advisement. 

## 2013-03-02 ENCOUNTER — Encounter: Payer: Self-pay | Admitting: Internal Medicine

## 2013-03-02 ENCOUNTER — Ambulatory Visit (INDEPENDENT_AMBULATORY_CARE_PROVIDER_SITE_OTHER): Payer: BC Managed Care – PPO | Admitting: Internal Medicine

## 2013-03-02 VITALS — BP 136/80 | HR 76 | Ht 62.6 in | Wt 175.4 lb

## 2013-03-02 DIAGNOSIS — R141 Gas pain: Secondary | ICD-10-CM

## 2013-03-02 MED ORDER — SACCHAROMYCES BOULARDII 250 MG PO CAPS: 250.0000 mg | ORAL_CAPSULE | Freq: Two times a day (BID) | ORAL | Status: DC

## 2013-03-02 NOTE — Patient Instructions (Addendum)
We have given you samples of Florastor. This puts good bacteria back into your colon. You should take 1 capsule by mouth once daily. If this works well for you, it can be purchased over the counter.  You have been given some information on gas  Please follow up as needed

## 2013-03-02 NOTE — Progress Notes (Signed)
HISTORY OF PRESENT ILLNESS:  Sylvia Davies is a 52 y.o. female with the below listed medical history who was last seen 01/01/2010 for screening colonoscopy. The examination was normal except for mild sigmoid diverticulosis. Routine followup in 10 years recommended. The patient presents today with a chief complaint of increased intestinal gas as manifested, principally, by increased flatus. Rare belching. Mild bloating but no pain. She has tried Beano without improvement. She denies change in her diet or new medications. Recently had problems with perianal itching. Her primary provider prescribed hydrocortisone cream. A problem resolved. Review of blood work from April was unremarkable including CBC and comprehensive metabolic panel. She is not diabetic. She finds the flatus embarrassing  REVIEW OF SYSTEMS:  All non-GI ROS negative except for sinus and allergy troubles, heart murmur  Past Medical History  Diagnosis Date  . MITRAL REGURGITATION, 0 (MILD) 04/02/2007  . HYPERTENSION, BENIGN ESSENTIAL 05/04/2007  . HYPERLIPIDEMIA 04/02/2007  . GERD 04/02/2007  . ALLERGIC RHINITIS 04/02/2007  . OBESITY 04/02/2007  . MIGRAINE HEADACHE 04/02/2007  . Irritable bowel syndrome 04/02/2007  . FIBROCYSTIC BREAST DISEASE, HX OF 04/02/2007  . BREAST BIOPSY, HX OF 04/02/2007  . Impaired glucose tolerance 11/08/2012  . Diverticulosis   . Hemorrhoids     Past Surgical History  Procedure Laterality Date  . Breast biopsy Left   . Leg surgery Left     have rods and srcews    Social History Sylvia Davies  reports that she has never smoked. She has never used smokeless tobacco. She reports that she does not drink alcohol or use illicit drugs.  family history includes Diabetes in her maternal grandmother; Hypertension in her mother.  Allergies  Allergen Reactions  . Amlodipine Besy-Benazepril Hcl     REACTION: itch and sore throat  . Cleocin [Clindamycin Hcl] Itching  . Hyoscyamine   . Penicillins   .  Tramadol        PHYSICAL EXAMINATION: Vital signs: BP 136/80  Pulse 76  Ht 5' 2.6" (1.59 m)  Wt 175 lb 6 oz (79.55 kg)  BMI 31.47 kg/m2  LMP 01/23/2013 General: Well-developed, well-nourished, no acute distress HEENT: Sclerae are anicteric, conjunctiva pink. Oral mucosa intact Lungs: Clear Heart: Regular Abdomen: soft, nontender, nondistended, no obvious ascites, no peritoneal signs, normal bowel sounds. No organomegaly. Extremities: No edema Psychiatric: alert and oriented x3. Cooperative   ASSESSMENT:  #1. Increased intestinal gas, principally flatus. No alarm features. Negative colonoscopy 3 years ago   PLAN:  #1. Discussion on the pathophysiology of intestinal gas #2. Literature provided on intestinal gas #3. Anti-gas and flatulence dietary sheet #4. Probiotic samples #5. Reassurance #6. Followup when necessary. Resume general medical care with PCP

## 2013-04-11 ENCOUNTER — Encounter: Payer: Self-pay | Admitting: Internal Medicine

## 2013-04-11 ENCOUNTER — Other Ambulatory Visit (INDEPENDENT_AMBULATORY_CARE_PROVIDER_SITE_OTHER): Payer: BC Managed Care – PPO

## 2013-04-11 ENCOUNTER — Ambulatory Visit (INDEPENDENT_AMBULATORY_CARE_PROVIDER_SITE_OTHER): Payer: BC Managed Care – PPO | Admitting: Internal Medicine

## 2013-04-11 VITALS — BP 112/80 | HR 75 | Temp 98.3°F | Ht 62.0 in | Wt 178.2 lb

## 2013-04-11 DIAGNOSIS — I10 Essential (primary) hypertension: Secondary | ICD-10-CM

## 2013-04-11 DIAGNOSIS — E785 Hyperlipidemia, unspecified: Secondary | ICD-10-CM

## 2013-04-11 DIAGNOSIS — M25562 Pain in left knee: Secondary | ICD-10-CM | POA: Insufficient documentation

## 2013-04-11 DIAGNOSIS — R7309 Other abnormal glucose: Secondary | ICD-10-CM

## 2013-04-11 DIAGNOSIS — M25569 Pain in unspecified knee: Secondary | ICD-10-CM

## 2013-04-11 DIAGNOSIS — M79605 Pain in left leg: Secondary | ICD-10-CM | POA: Insufficient documentation

## 2013-04-11 DIAGNOSIS — R7302 Impaired glucose tolerance (oral): Secondary | ICD-10-CM

## 2013-04-11 LAB — LIPID PANEL
HDL: 45.8 mg/dL (ref >39.00)
Total CHOL/HDL Ratio: 3

## 2013-04-11 LAB — HEPATIC FUNCTION PANEL
ALT: 12 U/L (ref 0–35)
AST: 17 U/L (ref 0–37)
Alkaline Phosphatase: 92 U/L (ref 39–117)
Bilirubin, Direct: 0.1 mg/dL (ref 0.0–0.3)
Total Bilirubin: 0.5 mg/dL (ref 0.3–1.2)

## 2013-04-11 LAB — HEMOGLOBIN A1C: Hgb A1c MFr Bld: 6.1 % (ref 4.6–6.5)

## 2013-04-11 MED ORDER — NAPROXEN 500 MG PO TABS: 500.0000 mg | ORAL_TABLET | Freq: Two times a day (BID) | ORAL | Status: DC

## 2013-04-11 NOTE — Assessment & Plan Note (Signed)
stable overall by history and exam, recent data reviewed with pt, and pt to continue medical treatment as before,  to f/u any worsening symptoms or concerns BP Readings from Last 3 Encounters:  04/11/13 112/80  03/02/13 136/80  12/20/12 120/80

## 2013-04-11 NOTE — Assessment & Plan Note (Signed)
I think this is source of her left leg pain, exam o/w benign, for nsaid prn, consider seeing Dr Smith/sports med if not improved

## 2013-04-11 NOTE — Patient Instructions (Signed)
Please take all new medication as prescribed - the anti-inflammotry medication if you have pain to the knee and leg Please continue all other medications as before, and refills have been done if requested. Please have the pharmacy call with any other refills you may need. OK to continue your activities as you have been doing recently  Please go to the LAB in the Basement (turn left off the elevator) for the tests to be done today You will be contacted by phone if any changes need to be made immediately.  Otherwise, you will receive a letter about your results with an explanation, but please check with MyChart first.  Please remember to sign up for My Chart if you have not done so, as this will be important to you in the future with finding out test results, communicating by private email, and scheduling acute appointments online when needed.

## 2013-04-11 NOTE — Assessment & Plan Note (Signed)
stable overall by history and exam, recent data reviewed with pt, and pt to continue medical treatment as before,  to f/u any worsening symptoms or concerns Lab Results  Component Value Date   HGBA1C 5.9 10/16/2012

## 2013-04-11 NOTE — Assessment & Plan Note (Signed)
stable overall by history and exam, recent data reviewed with pt, and pt to continue medical treatment as before,  to f/u any worsening symptoms or concern, for f/u lab today Lab Results  Component Value Date   LDLCALC 127* 04/24/2008

## 2013-04-11 NOTE — Progress Notes (Signed)
Subjective:    Patient ID: Sylvia Davies, female    DOB: [birth year], 52 y.o.   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 No bowel or bladder change, fever, wt loss,  worsening LE numbness/weakness, gait change or falls.  Has been walking more over x 2-3 mo since school re-started.  No specific left knee swelling, giveaways.  Is determined to cont exercise though has not been able to lose wt yet.  Pt denies polydipsia, polyuria, or low sugar symptoms such as weakness or confusion improved with po intake.  Pt states overall good compliance with meds, trying to follow lower cholesterol diet, wt overall stable but little exercise however.   Overall good compliance with treatment, and good medicine tolerability, including the new statin  Past Medical History  Diagnosis Date  . MITRAL REGURGITATION, 0 (MILD) 04/02/2007  . HYPERTENSION, BENIGN ESSENTIAL 05/04/2007  . HYPERLIPIDEMIA 04/02/2007  . GERD 04/02/2007  . ALLERGIC RHINITIS 04/02/2007  . OBESITY 04/02/2007  . MIGRAINE HEADACHE 04/02/2007  . Irritable bowel syndrome 04/02/2007  . FIBROCYSTIC BREAST DISEASE, HX OF 04/02/2007  . BREAST BIOPSY, HX OF 04/02/2007  . Impaired glucose tolerance 11/08/2012  . Diverticulosis   . Hemorrhoids    Past Surgical History  Procedure Laterality Date  . Breast biopsy Left   . Leg surgery Left     have rods and srcews    reports that she has never smoked. She has never used smokeless tobacco. She reports that she does not drink alcohol or use illicit drugs. family history includes Diabetes in her maternal grandmother; Hypertension in her mother. Allergies  Allergen Reactions  . Amlodipine Besy-Benazepril Hcl     REACTION: itch and sore throat  . Cleocin  [Clindamycin Hcl] Itching  . Hyoscyamine   . Penicillins   . Tramadol    Current Outpatient Prescriptions on File Prior to Visit  Medication Sig Dispense Refill  . aspirin 81 MG tablet Take 81 mg by mouth daily.        [name] atorvastatin (LIPITOR) 10 MG tablet Take 1 tablet (10 mg total) by mouth daily.  90 tablet  3  . benazepril (LOTENSIN) 20 MG tablet TAKE 1 TABLET (20 MG TOTAL) BY MOUTH DAILY.  90 tablet  3  . hydrocortisone (ANUSOL-HC) 25 MG suppository Place 25 mg rectally every 12 (twelve) hours as needed.      . saccharomyces boulardii (FLORASTOR) 250 MG capsule Take 1 capsule (250 mg total) by mouth 2 (two) times daily.  2 capsule  0  . triamcinolone cream (KENALOG) 0.1 % Apply 1 application topically 2 (two) times daily as needed.       No current facility-administered medications on file prior to visit.    Review of Systems  Constitutional: Negative for unexpected weight change, or unusual diaphoresis  HENT: Negative for tinnitus.   Eyes: Negative for photophobia and visual disturbance.  Respiratory: Negative for choking and stridor.   Gastrointestinal: Negative for vomiting and blood in stool.  Genitourinary: Negative for hematuria and decreased urine volume.  Musculoskeletal: Negative for acute joint swelling Skin: Negative for color change and wound.  Neurological: Negative for tremors and numbness other than noted  Psychiatric/Behavioral: Negative for decreased  concentration or  hyperactivity.       Objective:   Physical Exam BP 112/80  Pulse 75  Temp(Src) 98.3 F (36.8 C) (Oral)  Ht 5\' 2"  (1.575 m)  Wt 178 lb 4 oz (80.854 kg)  BMI 32.59 kg/m2  SpO2 97% VS noted,  Constitutional: Pt appears well-developed and well-nourished.  HENT: Head: NCAT.  Right Ear: External ear normal.  Left Ear: External ear normal.  Eyes: Conjunctivae and EOM are normal. Pupils are equal, round, and reactive to light.  Neck: Normal range of motion. Neck supple.  Cardiovascular:  Normal rate and regular rhythm.   Pulmonary/Chest: Effort normal and breath sounds normal.  Abd:  Soft, NT, non-distended, + BS Neurological: Pt is alert. Not confused , motor 5/5, sens/dtr intact, gait intact Left knee with moderate crepitus but NT, no effusion, FROM Skin: Skin is warm. No erythema.  Psychiatric: Pt behavior is normal. Thought content normal.  Spine; nontender    Assessment & Plan:

## 2013-06-18 ENCOUNTER — Other Ambulatory Visit: Payer: Self-pay | Admitting: Internal Medicine

## 2013-07-26 ENCOUNTER — Emergency Department (INDEPENDENT_AMBULATORY_CARE_PROVIDER_SITE_OTHER)
Admission: EM | Admit: 2013-07-26 | Discharge: 2013-07-26 | Disposition: A | Discharge status: Home / Self Care | Payer: Worker's Compensation | Source: Home / Self Care | Attending: Family Medicine | Admitting: Family Medicine

## 2013-07-26 ENCOUNTER — Encounter (HOSPITAL_COMMUNITY): Payer: Self-pay | Admitting: Emergency Medicine

## 2013-07-26 DIAGNOSIS — M79609 Pain in unspecified limb: Secondary | ICD-10-CM

## 2013-07-26 DIAGNOSIS — W19XXXA Unspecified fall, initial encounter: Secondary | ICD-10-CM

## 2013-07-26 DIAGNOSIS — M79606 Pain in leg, unspecified: Secondary | ICD-10-CM

## 2013-07-26 NOTE — Discharge Instructions (Signed)
Musculoskeletal Pain °Musculoskeletal pain is muscle and boney aches and pains. These pains can occur in any part of the body. Your caregiver may treat you without knowing the cause of the pain. They may treat you if blood or urine tests, X-rays, and other tests were normal.  °CAUSES °There is often not a definite cause or reason for these pains. These pains may be caused by a type of germ (virus). The discomfort may also come from overuse. Overuse includes working out too hard when your body is not fit. Boney aches also come from weather changes. Bone is sensitive to atmospheric pressure changes. °HOME CARE INSTRUCTIONS  °· Ask when your test results will be ready. Make sure you get your test results. °· Only take over-the-counter or prescription medicines for pain, discomfort, or fever as directed by your caregiver. If you were given medications for your condition, do not drive, operate machinery or power tools, or sign legal documents for 24 hours. Do not drink alcohol. Do not take sleeping pills or other medications that may interfere with treatment. °· Continue all activities unless the activities cause more pain. When the pain lessens, slowly resume normal activities. Gradually increase the intensity and duration of the activities or exercise. °· During periods of severe pain, bed rest may be helpful. Lay or sit in any position that is comfortable. °· Putting ice on the injured area. °· Put ice in a bag. °· Place a towel between your skin and the bag. °· Leave the ice on for 15 to 20 minutes, 3 to 4 times a day. °· Follow up with your caregiver for continued problems and no reason can be found for the pain. If the pain becomes worse or does not go away, it may be necessary to repeat tests or do additional testing. Your caregiver may need to look further for a possible cause. °SEEK IMMEDIATE MEDICAL CARE IF: °· You have pain that is getting worse and is not relieved by medications. °· You develop chest pain  that is associated with shortness or breath, sweating, feeling sick to your stomach (nauseous), or throw up (vomit). °· Your pain becomes localized to the abdomen. °· You develop any new symptoms that seem different or that concern you. °MAKE SURE YOU:  °· Understand these instructions. °· Will watch your condition. °· Will get help right away if you are not doing well or get worse. °Document Released: 06/28/2005 Document Revised: 09/20/2011 Document Reviewed: 03/02/2013 °[employer]® Patient Information ©2014 [employer]. ° °

## 2013-07-26 NOTE — ED Provider Notes (Signed)
CSN: [id number]     Arrival date & time 07/26/13  1822 History   First MD Initiated Contact with Patient 07/26/13 1912     Chief Complaint  Patient presents with  . Leg Pain   (Consider location/radiation/quality/duration/timing/severity/associated sxs/prior Treatment) HPI Comments: 30f presents c/o leg pain.  She was walking off a bus earlier when she tripped and fell.  She is only having pain now in the back of her right calf.  The pain is moderate and non-radiating.  She is ambulatory, with no increased pain with ambulation.  No swelling in the calf.  No other injury.  No numbness.    Patient is a 53 y.o. female presenting with leg pain.  Leg Pain Associated symptoms: no fever     Past Medical History  Diagnosis Date  . MITRAL REGURGITATION, 0 (MILD) 04/02/2007  . HYPERTENSION, BENIGN ESSENTIAL 05/04/2007  . HYPERLIPIDEMIA 04/02/2007  . GERD 04/02/2007  . ALLERGIC RHINITIS 04/02/2007  . OBESITY 04/02/2007  . MIGRAINE HEADACHE 04/02/2007  . Irritable bowel syndrome 04/02/2007  . FIBROCYSTIC BREAST DISEASE, HX OF 04/02/2007  . BREAST BIOPSY, HX OF 04/02/2007  . Impaired glucose tolerance 11/08/2012  . Diverticulosis   . Hemorrhoids    Past Surgical History  Procedure Laterality Date  . Breast biopsy Left   . Leg surgery Left     have rods and srcews   Family History  Problem Relation Age of Onset  . Hypertension Mother   . Diabetes Maternal Grandmother    History  Substance Use Topics  . Smoking status: Never Smoker   . Smokeless tobacco: Never Used  . Alcohol Use: No   OB History   Grav Para Term Preterm Abortions TAB SAB Ect Mult Living                 Review of Systems  Constitutional: Negative for fever and chills.  Eyes: Negative for visual disturbance.  Respiratory: Negative for cough and shortness of breath.   Cardiovascular: Negative for chest pain, palpitations and leg swelling.  Gastrointestinal: Negative for nausea, vomiting and abdominal pain.  Endocrine:  Negative for polydipsia and polyuria.  Genitourinary: Negative for dysuria, urgency and frequency.  Musculoskeletal: Positive for myalgias. Negative for arthralgias.  Skin: Negative for rash.  Neurological: Negative for dizziness, weakness and light-headedness.    Allergies  Amlodipine besy-benazepril hcl; Cleocin; Hyoscyamine; Penicillins; and Tramadol  Home Medications   Current Outpatient Rx  Name  Route  Sig  Dispense  Refill  . benazepril (LOTENSIN) 20 MG tablet      TAKE 1 TABLET (20 MG TOTAL) BY MOUTH DAILY.   90 tablet   3   . aspirin 81 MG tablet   Oral   Take 81 mg by mouth daily.           [name] atorvastatin (LIPITOR) 10 MG tablet   Oral   Take 1 tablet (10 mg total) by mouth daily.   90 tablet   3   . fluconazole (DIFLUCAN) 150 MG tablet      TAKE 1 TABLET ONCE A DAY ORALLY   1 tablet   1   . hydrocortisone (ANUSOL-HC) 25 MG suppository   Rectal   Place 25 mg rectally every 12 (twelve) hours as needed.         . naproxen (NAPROSYN) 500 MG tablet   Oral   Take 1 tablet (500 mg total) by mouth 2 (two) times daily with a meal.   60 tablet  5   . saccharomyces boulardii (FLORASTOR) 250 MG capsule   Oral   Take 1 capsule (250 mg total) by mouth 2 (two) times daily.   2 capsule   0     Samples given to patient    Lot#    [id number]           ...   . triamcinolone cream (KENALOG) 0.1 %   Topical   Apply 1 application topically 2 (two) times daily as needed.          BP 159/97  Pulse 72  Temp(Src) 97.5 F (36.4 C) (Oral)  Resp 20  SpO2 100%  LMP 06/30/2013 Physical Exam  Nursing note and vitals reviewed. Constitutional: She is oriented to person, place, and time. Vital signs are normal. She appears well-developed and well-nourished. No distress.  HENT:  Head: Normocephalic and atraumatic.  Pulmonary/Chest: Effort normal. No respiratory distress.  Musculoskeletal:       Right lower leg: She exhibits tenderness (minimal TTP posterior calf ). She  exhibits no bony tenderness, no swelling, no edema, no deformity and no laceration.  No ROM deficits of ankle or knee.  No swelling.  Achilles tendon nontender and intact.  Fibula nontender.    Neurological: She is alert and oriented to person, place, and time. She has normal strength. Coordination normal.  Skin: Skin is warm and dry. No rash noted. She is not diaphoretic.  Psychiatric: She has a normal mood and affect. Judgment normal.    ED Course  Procedures (including critical care time) Labs Review Labs Reviewed - No data to display Imaging Review No results found.    MDM   1. Leg pain   2. Fall    Mild calf contusion.  F/U if not improving in a few days.  Aleve PRN for pain.      Liam Graham, PA-C 07/26/13 1955

## 2013-07-26 NOTE — ED Provider Notes (Signed)
Medical screening examination/treatment/procedure(s) were performed by resident physician or non-physician practitioner and as supervising physician I was immediately available for consultation/collaboration.   Pauline Good MD.   Billy Fischer, MD 07/26/13 2059

## 2013-07-26 NOTE — ED Notes (Signed)
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

## 2013-08-11 ENCOUNTER — Encounter: Payer: Self-pay | Admitting: Internal Medicine

## 2013-08-11 ENCOUNTER — Ambulatory Visit (INDEPENDENT_AMBULATORY_CARE_PROVIDER_SITE_OTHER): Payer: BC Managed Care – PPO | Admitting: Internal Medicine

## 2013-08-11 ENCOUNTER — Telehealth: Payer: Self-pay | Admitting: Internal Medicine

## 2013-08-11 VITALS — BP 150/90 | HR 73 | Temp 98.7°F | Resp 20 | Ht 62.0 in | Wt 182.0 lb

## 2013-08-11 DIAGNOSIS — J029 Acute pharyngitis, unspecified: Secondary | ICD-10-CM

## 2013-08-11 LAB — POCT RAPID STREP A (OFFICE): Rapid Strep A Screen: NEGATIVE

## 2013-08-11 NOTE — Progress Notes (Signed)
Pre-visit discussion using our clinic review tool. No additional management support is needed unless otherwise documented below in the visit note.  

## 2013-08-11 NOTE — Progress Notes (Signed)
   Subjective:    Patient ID: Sylvia Davies, female    DOB: 07-Apr-1961, 53 y.o.   MRN: [id number]  HPI    Her symptoms began 5 days ago as a scratchy throat and dry cough.  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  She describes associated nasal pressure and rhinitis with clear secretions. She has had some itchy ears.  The throat is very uncomfortable at night.  She has taken Alka-Seltzer cold without significant response  She is on ACE inhibitor. She did take the flu shot this year    Review of Systems She denies fever, chills, or sweats  She has no frontal headache, facial pain, nasal purulence, dental pain, otic pain, or discharge  She denies itchy, watery eyes or sneezing. No dyspnea or wheezing     Objective:   Physical Exam General appearance:good health ;well nourished; no acute distress or increased work of breathing is present.  No  lymphadenopathy about the head, neck, or axilla noted. Prominent submandibular lymph nodes  Eyes: No conjunctival inflammation or lid edema is present.   Ears:  External ear exam shows no significant lesions or deformities.  Otoscopic examination reveals clear canals, tympanic membranes are intact bilaterally without bulging, retraction, inflammation or discharge.  Nose:  External nasal examination shows no deformity or inflammation. Nasal mucosa are pink and moist without lesions or exudates. No septal dislocation or deviation.No obstruction to airflow.   Oral exam: Dental hygiene is good; lips and gums are healthy appearing.There is no oropharyngeal erythema or exudate noted.   Neck:  No deformities,  masses, or tenderness noted.    Heart:  Normal rate and regular rhythm. S1 and S2 normal without gallop, murmur, click, rub or other extra sounds.   Lungs:Chest clear to auscultation; no wheezes, rhonchi,rales ,or rubs present.No increased work of breathing.    Extremities:  No cyanosis, edema, or clubbing  noted    Skin: Warm &  dry     Assessment & Plan:  #1 pharyngitis The CENTOR criteria for pharyngitis which include fever, pharyngeal exudate, tender cervical lymphadenopathy, and absence of cough were assessed. See orders

## 2013-08-11 NOTE — Patient Instructions (Signed)
Zicam Melts or Zinc lozenges as per package label for scratchy throat . Complementary options include  vitamin C 2000 mg daily; & Echinacea for 4-7 days. Report persistent or progressive fever; discolored nasal or chest secretions; or frontal headache or facial  pain.     Plain Mucinex (NOT D) for thick secretions ;force NON dairy fluids .   Nasal cleansing in the shower as discussed with lather of mild shampoo.After 10 seconds wash off lather while  exhaling through nostrils. Make sure that all residual soap is removed to prevent irritation.  Nasacort AQOTC 1 spray in each nostril twice a day as needed. Use the "crossover" technique into opposite nostril spraying toward opposite ear @ 45 degree angle, not straight up into nostril.  Use a Neti pot daily only  as needed for significant sinus congestion; going from open side to congested side . Plain Allegra (NOT D )  160 daily , Loratidine 10 mg , OR Zyrtec 10 mg @ bedtime  as needed for itchy eyes & sneezing.      

## 2013-08-11 NOTE — Telephone Encounter (Signed)
Pt had flu shot on 04/13/13 at a [place] facility.  I have scanned in her paperwork under release of information. Please update this immunization.

## 2013-08-13 ENCOUNTER — Encounter: Payer: Self-pay | Admitting: Internal Medicine

## 2013-08-13 NOTE — Telephone Encounter (Signed)
Updated flu shot info.../lmb 

## 2013-10-15 ENCOUNTER — Other Ambulatory Visit: Payer: Self-pay

## 2013-10-15 DIAGNOSIS — Z1231 Encounter for screening mammogram for malignant neoplasm of breast: Secondary | ICD-10-CM

## 2013-11-04 ENCOUNTER — Other Ambulatory Visit: Payer: Self-pay | Admitting: Internal Medicine

## 2013-11-10 ENCOUNTER — Other Ambulatory Visit: Payer: Self-pay | Admitting: Internal Medicine

## 2013-11-14 ENCOUNTER — Ambulatory Visit
Admission: RE | Admit: 2013-11-14 | Discharge: 2013-11-14 | Disposition: A | Discharge status: Home / Self Care | Payer: BC Managed Care – PPO | Source: Ambulatory Visit

## 2013-11-14 ENCOUNTER — Encounter (INDEPENDENT_AMBULATORY_CARE_PROVIDER_SITE_OTHER): Payer: Self-pay

## 2013-11-14 DIAGNOSIS — Z1231 Encounter for screening mammogram for malignant neoplasm of breast: Secondary | ICD-10-CM

## 2013-11-14 LAB — HM MAMMOGRAPHY

## 2013-12-06 ENCOUNTER — Other Ambulatory Visit: Payer: Self-pay | Admitting: Internal Medicine

## 2013-12-11 ENCOUNTER — Other Ambulatory Visit (INDEPENDENT_AMBULATORY_CARE_PROVIDER_SITE_OTHER): Payer: BC Managed Care – PPO

## 2013-12-11 DIAGNOSIS — Z0000 Encounter for general adult medical examination without abnormal findings: Secondary | ICD-10-CM

## 2013-12-11 DIAGNOSIS — R7302 Impaired glucose tolerance (oral): Secondary | ICD-10-CM

## 2013-12-11 DIAGNOSIS — R7309 Other abnormal glucose: Secondary | ICD-10-CM

## 2013-12-11 LAB — URINALYSIS, ROUTINE W REFLEX MICROSCOPIC
Bilirubin Urine: NEGATIVE
Ketones, ur: NEGATIVE
LEUKOCYTES UA: NEGATIVE
Nitrite: NEGATIVE
SPECIFIC GRAVITY, URINE: 1.01 (ref 1.000–1.030)
Total Protein, Urine: NEGATIVE
URINE GLUCOSE: NEGATIVE
UROBILINOGEN UA: 0.2 (ref 0.0–1.0)
pH: 7 (ref 5.0–8.0)

## 2013-12-11 LAB — BASIC METABOLIC PANEL
BUN: 9 mg/dL (ref 6–23)
CO2: 27 mEq/L (ref 19–32)
CREATININE: 0.7 mg/dL (ref 0.4–1.2)
Calcium: 9.3 mg/dL (ref 8.4–10.5)
Chloride: 105 mEq/L (ref 96–112)
GFR: 117.76 mL/min (ref >60.00)
Glucose, Bld: 90 mg/dL (ref 70–99)
POTASSIUM: 4 meq/L (ref 3.5–5.1)
Sodium: 138 mEq/L (ref 135–145)

## 2013-12-11 LAB — LIPID PANEL
CHOLESTEROL: 127 mg/dL (ref 0–200)
HDL: 41.2 mg/dL (ref >39.00)
LDL Cholesterol: 75 mg/dL (ref 0–99)
Total CHOL/HDL Ratio: 3
Triglycerides: 52 mg/dL (ref 0.0–149.0)
VLDL: 10.4 mg/dL (ref 0.0–40.0)

## 2013-12-11 LAB — CBC WITH DIFFERENTIAL/PLATELET
BASOS ABS: 0 10*3/uL (ref 0.0–0.1)
Basophils Relative: 0.2 % (ref 0.0–3.0)
EOS ABS: 0.1 10*3/uL (ref 0.0–0.7)
Eosinophils Relative: 2.2 % (ref 0.0–5.0)
HCT: 38.5 % (ref 36.0–46.0)
Hemoglobin: 12.7 g/dL (ref 12.0–15.0)
Lymphocytes Relative: 34.5 % (ref 12.0–46.0)
Lymphs Abs: 2 10*3/uL (ref 0.7–4.0)
MCHC: 33 g/dL (ref 30.0–36.0)
MCV: 82.8 fl (ref 78.0–100.0)
MONOS PCT: 5.9 % (ref 3.0–12.0)
Monocytes Absolute: 0.4 10*3/uL (ref 0.1–1.0)
NEUTROS ABS: 3.4 10*3/uL (ref 1.4–7.7)
NEUTROS PCT: 57.2 % (ref 43.0–77.0)
PLATELETS: 222 10*3/uL (ref 150.0–400.0)
RBC: 4.64 Mil/uL (ref 3.87–5.11)
RDW: 15 % (ref 11.5–15.5)
WBC: 5.9 10*3/uL (ref 4.0–10.5)

## 2013-12-11 LAB — HEPATIC FUNCTION PANEL
ALK PHOS: 90 U/L (ref 39–117)
ALT: 13 U/L (ref 0–35)
AST: 17 U/L (ref 0–37)
Albumin: 3.7 g/dL (ref 3.5–5.2)
BILIRUBIN DIRECT: 0.1 mg/dL (ref 0.0–0.3)
Total Bilirubin: 0.7 mg/dL (ref 0.2–1.2)
Total Protein: 7.1 g/dL (ref 6.0–8.3)

## 2013-12-11 LAB — TSH: TSH: 0.77 u[IU]/mL (ref 0.35–4.50)

## 2013-12-11 LAB — HEMOGLOBIN A1C: HEMOGLOBIN A1C: 6 % (ref 4.6–6.5)

## 2013-12-21 ENCOUNTER — Encounter: Payer: Self-pay | Admitting: Internal Medicine

## 2013-12-21 ENCOUNTER — Ambulatory Visit (INDEPENDENT_AMBULATORY_CARE_PROVIDER_SITE_OTHER): Payer: BC Managed Care – PPO | Admitting: Internal Medicine

## 2013-12-21 VITALS — BP 120/80 | HR 82 | Temp 98.6°F | Ht 62.0 in | Wt 180.0 lb

## 2013-12-21 DIAGNOSIS — Z0000 Encounter for general adult medical examination without abnormal findings: Secondary | ICD-10-CM

## 2013-12-21 MED ORDER — ATORVASTATIN CALCIUM 10 MG PO TABS: 10.0000 mg | ORAL_TABLET | Freq: Every day | ORAL | Status: DC

## 2013-12-21 MED ORDER — BENAZEPRIL HCL 20 MG PO TABS: 20.0000 mg | ORAL_TABLET | Freq: Every day | ORAL | Status: DC

## 2013-12-21 NOTE — Patient Instructions (Signed)
Please continue all other medications as before, and refills have been done if requested.  Please have the pharmacy call with any other refills you may need.  Please continue your efforts at being more active, low cholesterol diet, and weight control.  You are otherwise up to date with prevention measures today.  Please keep your appointments with your specialists as you may have planned  Please return in 1 year for your yearly visit, or sooner if needed, with Lab testing done 3-5 days before  

## 2013-12-21 NOTE — Progress Notes (Signed)
Subjective:    Patient ID: Sylvia Davies, female    DOB: Jun 13, 1961, 53 y.o.   MRN: [id number]  HPI Here for wellness and f/u;  Overall doing ok;  Pt denies CP, worsening SOB, DOE, wheezing, orthopnea, PND, worsening LE edema, palpitations, dizziness or syncope.  Pt denies neurological change such as new headache, facial or extremity weakness.  Pt denies polydipsia, polyuria, or low sugar symptoms. Pt states overall good compliance with treatment and medications, good tolerability, and has been trying to follow lower cholesterol diet.  Pt denies worsening depressive symptoms, suicidal ideation or panic. No fever, night sweats, wt loss, loss of appetite, or other constitutional symptoms.  Pt states good ability with ADL's, has low fall risk, home safety reviewed and adequate, no other significant changes in hearing or vision, and trying to do much better this past yr with diet, has lost 1 lb as well. Past Medical History  Diagnosis Date  . MITRAL REGURGITATION, 0 (MILD) 04/02/2007  . HYPERTENSION, BENIGN ESSENTIAL 05/04/2007  . HYPERLIPIDEMIA 04/02/2007  . GERD 04/02/2007  . ALLERGIC RHINITIS 04/02/2007  . OBESITY 04/02/2007  . MIGRAINE HEADACHE 04/02/2007  . Irritable bowel syndrome 04/02/2007  . FIBROCYSTIC BREAST DISEASE, HX OF 04/02/2007  . BREAST BIOPSY, HX OF 04/02/2007  . Impaired glucose tolerance 11/08/2012  . Diverticulosis   . Hemorrhoids    Past Surgical History  Procedure Laterality Date  . Breast biopsy Left   . Leg surgery Left     have rods and srcews    reports that she has never smoked. She has never used smokeless tobacco. She reports that she does not drink alcohol or use illicit drugs. family history includes Diabetes in her maternal grandmother; Hypertension in her mother. Allergies  Allergen Reactions  . Amlodipine Besy-Benazepril Hcl     REACTION: itch and sore throat  . Cleocin [Clindamycin Hcl] Itching  . Hyoscyamine   . Penicillins   . Tramadol    Review of  Systems Constitutional: Negative for increased diaphoresis, other activity, appetite or other siginficant weight change  HENT: Negative for worsening hearing loss, ear pain, facial swelling, mouth sores and neck stiffness.   Eyes: Negative for other worsening pain, redness or visual disturbance.  Respiratory: Negative for shortness of breath and wheezing.   Cardiovascular: Negative for chest pain and palpitations.  Gastrointestinal: Negative for diarrhea, blood in stool, abdominal distention or other pain Genitourinary: Negative for hematuria, flank pain or change in urine volume.  Musculoskeletal: Negative for myalgias or other joint complaints.  Skin: Negative for color change and wound.  Neurological: Negative for syncope and numbness. other than noted Hematological: Negative for adenopathy. or other swelling Psychiatric/Behavioral: Negative for hallucinations, self-injury, decreased concentration or other worsening agitation.      Objective:   Physical Exam BP 120/80  Pulse 82  Temp(Src) 98.6 F (37 C) (Oral)  Ht 5\' 2"  (1.575 m)  Wt 180 lb (81.647 kg)  BMI 32.91 kg/m2  SpO2 92% VS noted,  Constitutional: Pt is oriented to person, place, and time. Appears well-developed and well-nourished.  Head: Normocephalic and atraumatic.  Right Ear: External ear normal.  Left Ear: External ear normal.  Nose: Nose normal.  Mouth/Throat: Oropharynx is clear and moist.  Eyes: Conjunctivae and EOM are normal. Pupils are equal, round, and reactive to light.  Neck: Normal range of motion. Neck supple. No JVD present. No tracheal deviation present.  Cardiovascular: Normal rate, regular rhythm, normal heart sounds and intact distal pulses.   Pulmonary/Chest:  Effort normal and breath sounds without rales or wheezing  Abdominal: Soft. Bowel sounds are normal. NT. No HSM  Musculoskeletal: Normal range of motion. Exhibits no edema.  Lymphadenopathy:  Has no cervical adenopathy.  Neurological: Pt is  alert and oriented to person, place, and time. Pt has normal reflexes. No cranial nerve deficit. Motor grossly intact Skin: Skin is warm and dry. No rash noted.  Psychiatric:  Has normal mood and affect. Behavior is normal.     Assessment & Plan:

## 2013-12-21 NOTE — Progress Notes (Signed)
Pre visit review using our clinic review tool, if applicable. No additional management support is needed unless otherwise documented below in the visit note. 

## 2013-12-21 NOTE — Assessment & Plan Note (Signed)

## 2014-01-01 ENCOUNTER — Ambulatory Visit (INDEPENDENT_AMBULATORY_CARE_PROVIDER_SITE_OTHER): Payer: BC Managed Care – PPO | Admitting: Internal Medicine

## 2014-01-01 ENCOUNTER — Encounter: Payer: Self-pay | Admitting: Internal Medicine

## 2014-01-01 VITALS — BP 120/80 | HR 78 | Temp 98.0°F | Ht 62.0 in | Wt 181.2 lb

## 2014-01-01 DIAGNOSIS — R21 Rash and other nonspecific skin eruption: Secondary | ICD-10-CM

## 2014-01-01 DIAGNOSIS — I10 Essential (primary) hypertension: Secondary | ICD-10-CM

## 2014-01-01 DIAGNOSIS — R7309 Other abnormal glucose: Secondary | ICD-10-CM

## 2014-01-01 DIAGNOSIS — R7302 Impaired glucose tolerance (oral): Secondary | ICD-10-CM

## 2014-01-01 MED ORDER — KETOCONAZOLE 2 % EX CREA: 1.0000 "application " | TOPICAL_CREAM | Freq: Every day | CUTANEOUS | Status: DC

## 2014-01-01 NOTE — Progress Notes (Signed)
Subjective:    Patient ID: Sylvia Davies, female    DOB: 1961/06/28, 53 y.o.   MRN: [id number]  HPI  here to f/u, with c/o 1 mo gradually worsening bilat feet itch and burning discomfort, not better with otc antifungals or only temporary relief, no fever or prior hx, cannot always change socks when feet are wet.  Nothing else makes better or worse. Pt denies chest pain, increased sob or doe, wheezing, orthopnea, PND, increased LE swelling, palpitations, dizziness or syncope.   Pt denies polydipsia, polyuria, .  Pt states overall good compliance with meds, trying to follow lower cholesterol diet, wt overall stable but little exercise however.     Past Medical History  Diagnosis Date  . MITRAL REGURGITATION, 0 (MILD) 04/02/2007  . HYPERTENSION, BENIGN ESSENTIAL 05/04/2007  . HYPERLIPIDEMIA 04/02/2007  . GERD 04/02/2007  . ALLERGIC RHINITIS 04/02/2007  . OBESITY 04/02/2007  . MIGRAINE HEADACHE 04/02/2007  . Irritable bowel syndrome 04/02/2007  . FIBROCYSTIC BREAST DISEASE, HX OF 04/02/2007  . BREAST BIOPSY, HX OF 04/02/2007  . Impaired glucose tolerance 11/08/2012  . Diverticulosis   . Hemorrhoids    Past Surgical History  Procedure Laterality Date  . Breast biopsy Left   . Leg surgery Left     have rods and srcews    reports that she has never smoked. She has never used smokeless tobacco. She reports that she does not drink alcohol or use illicit drugs. family history includes Diabetes in her maternal grandmother; Hypertension in her mother. Allergies  Allergen Reactions  . Amlodipine Besy-Benazepril Hcl     REACTION: itch and sore throat  . Cleocin [Clindamycin Hcl] Itching  . Hyoscyamine   . Penicillins   . Tramadol    Current Outpatient Prescriptions on File Prior to Visit  Medication Sig Dispense Refill  . aspirin 81 MG tablet Take 81 mg by mouth daily.        [name] atorvastatin (LIPITOR) 10 MG tablet Take 1 tablet (10 mg total) by mouth daily at 6 PM.  90 tablet  3  . benazepril  (LOTENSIN) 20 MG tablet Take 1 tablet (20 mg total) by mouth daily.  90 tablet  3  . fluconazole (DIFLUCAN) 150 MG tablet TAKE 1 TABLET ONCE A DAY ORALLY  1 tablet  1  . hydrocortisone (ANUSOL-HC) 25 MG suppository Place 25 mg rectally every 12 (twelve) hours as needed.      . naproxen (NAPROSYN) 500 MG tablet Take 1 tablet (500 mg total) by mouth 2 (two) times daily with a meal.  60 tablet  5  . saccharomyces boulardii (FLORASTOR) 250 MG capsule Take 1 capsule (250 mg total) by mouth 2 (two) times daily.  2 capsule  0  . triamcinolone cream (KENALOG) 0.1 % Apply 1 application topically 2 (two) times daily as needed.       No current facility-administered medications on file prior to visit.   Review of Systems  Constitutional: Negative for unusual diaphoresis or other sweats  HENT: Negative for ringing in ear Eyes: Negative for double vision or worsening visual disturbance.  Respiratory: Negative for choking and stridor.   Gastrointestinal: Negative for vomiting or other signifcant bowel change Genitourinary: Negative for hematuria or decreased urine volume.  Musculoskeletal: Negative for other MSK pain or swelling Skin: Negative for color change and worsening wound.  Neurological: Negative for tremors and numbness other than noted  Psychiatric/Behavioral: Negative for decreased concentration or agitation other than above  Objective:   Physical Exam BP 120/80  Pulse 78  Temp(Src) 98 F (36.7 C) (Oral)  Ht 5\' 2"  (1.575 m)  Wt 181 lb 4 oz (82.214 kg)  BMI 33.14 kg/m2  SpO2 97% VS noted,  Constitutional: Pt appears well-developed, well-nourished.  HENT: Head: NCAT.  Right Ear: External ear normal.  Left Ear: External ear normal.  Eyes: . Pupils are equal, round, and reactive to light. Conjunctivae and EOM are normal Neck: Normal range of motion. Neck supple.  Cardiovascular: Normal rate and regular rhythm.   Pulmonary/Chest: Effort normal and breath sounds normal.    Neurological: Pt is alert. Not confused , motor grossly intact Skin: Skin is warm. Has nontender slightly silver scaly rash diffuse to plantar feet, between toes, and lateral feet as well Psychiatric: Pt behavior is normal. No agitation.      Assessment & Plan:

## 2014-01-01 NOTE — Progress Notes (Signed)
Pre visit review using our clinic review tool, if applicable. No additional management support is needed unless otherwise documented below in the visit note. 

## 2014-01-01 NOTE — Patient Instructions (Signed)
Please take all new medication as prescribed - the anti-fungal cream  Please continue all other medications as before, and refills have been done if requested.  Please have the pharmacy call with any other refills you may need.

## 2014-01-06 DIAGNOSIS — R21 Rash and other nonspecific skin eruption: Secondary | ICD-10-CM | POA: Insufficient documentation

## 2014-01-06 NOTE — Assessment & Plan Note (Addendum)
C/w fungal rash , not amenable to otc tx, for ketoconozole cr asd,  to f/u any worsening symptoms or concerns

## 2014-01-06 NOTE — Assessment & Plan Note (Signed)
stable overall by history and exam, recent data reviewed with pt, and pt to continue medical treatment as before,  to f/u any worsening symptoms or concerns Lab Results  Component Value Date   HGBA1C 6.0 12/11/2013

## 2014-01-06 NOTE — Assessment & Plan Note (Signed)
stable overall by history and exam, recent data reviewed with pt, and pt to continue medical treatment as before,  to f/u any worsening symptoms or concerns BP Readings from Last 3 Encounters:  01/01/14 120/80  12/21/13 120/80  08/11/13 150/90

## 2014-02-21 ENCOUNTER — Telehealth: Payer: Self-pay | Admitting: Internal Medicine

## 2014-02-21 NOTE — Telephone Encounter (Signed)
Ok for sat clinic 

## 2014-02-21 NOTE — Telephone Encounter (Signed)
Patient Information:  Caller Name: Ell[place]ise  Phone: [phone]  Patient: Sylvia Davies, Sylvia Davies  Gender: Female  DOB: 12-22-1960  Age: 53 Years  PCP: Cathlean Cower (Adults only)  Pregnant: No  Office Follow Up:  Does the office need to follow up with this patient?: Yes  Instructions For The Office: Offered patient appt for tomorrow 02/22/14 at 16:00 declined due to work . she is requesting appt time on Saturday. Explained sick visit hours/appt. Please contact patient regarding medication not working, appt time available .  RN Note:  Offered patient appt for tomorrow 02/22/14 at 16:00 declined due to work . she is requesting appt time on Saturday. Explained sick visit hours/appt. Please contact patient regarding medication not working, appt time available .  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 confined to bottom of the feet. It is not between the toes per patient.  She was seen in the office on 01/01/14.  She was given Rx for Ketaconazole Cream.  Patient states it is not working.  +itching , no drainage and denies burning.    Reviewed Health History In EMR: Yes  Reviewed Medications In EMR: Yes  Reviewed Allergies In EMR: Yes  Reviewed Surgeries / Procedures: Yes  Date of Onset of Symptoms: 12/01/2013  Treatments Tried: Ketaconazole cream  Treatments Tried Worked: No OB / GYN:  LMP: Unknown  Guideline(s) Used:  Athlete's Foot  Disposition Per Guideline:   See Within 3 Days in Office  Reason For Disposition Reached:   After 4 weeks on treatment and rash has not cleared completely  Advice Given:  Antifungal Cream:  Available over-the-counter in [place] States as terbinafine (Lamisil AT), clotrimazole (Lotrimin AF), or miconazole (Micatin, Monistat-Derm).  Keep the Feet Clean and Dry:   Wash the feet 2 times every day. Dry the feet completely, especially between the toes. Then apply the cream. Wear clean  socks and change them twice daily.  Avoid Scratching:   Scratching infected feet will delay healing. Rinse the itchy feet in cool water for relief.  Call Back If:  Rash looks infected (e.g., spreading redness, streaks, pus)  Rash continues to spread after 1 week of treatment  Rash has not cleared after 2 weeks of treatment  You become worse  RN Overrode Recommendation:  Patient Requests Prescription  Offered patient appt for tomorrow 02/22/14 at 16:00 declined due to work . she is requesting appt time on Saturday. Explained sick visit hours/appt. Please contact patient regarding medication not working, appt time available .

## 2014-02-25 ENCOUNTER — Encounter: Payer: Self-pay | Admitting: Internal Medicine

## 2014-02-25 ENCOUNTER — Ambulatory Visit (INDEPENDENT_AMBULATORY_CARE_PROVIDER_SITE_OTHER): Payer: BC Managed Care – PPO | Admitting: Internal Medicine

## 2014-02-25 VITALS — BP 158/90 | HR 78 | Temp 98.2°F | Wt 185.2 lb

## 2014-02-25 DIAGNOSIS — M79609 Pain in unspecified limb: Secondary | ICD-10-CM

## 2014-02-25 DIAGNOSIS — M79604 Pain in right leg: Secondary | ICD-10-CM

## 2014-02-25 NOTE — Patient Instructions (Addendum)
Please perform isometric exercises before going to bed; magcal may help the muscle symptoms as we discussed. If the mattress is not firm; please give thought to replacement.

## 2014-02-25 NOTE — Progress Notes (Signed)
   Subjective:    Patient ID: Sylvia Davies, female    DOB: 1960-08-13, 53 y.o.   MRN: [id number]  HPI She describes a bizarre history of throbbing pain up to level VIII-10 from the right inferior thigh to the mid calf only at night when supine. She questions whether the air conditioning blowing on that leg is causing her symptoms.  She denies any specific restless leg syndromes.  The discomfort has not been affected by physical activity.  She does walk on a regular basis.  She did have a fall in January but the symptoms did not begin for several months after that.  She has no associated neuromuscular or cardiovascular symptoms.     Review of Systems Fever, chills, sweats, or unexplained weight loss not present. There is no numbness, tingling, or weakness in extremities.   No loss of control of bladder or bowels. Radicular type pain absent.      Objective:   Physical Exam Present pertinent positive findings include: She does have crepitus in both knees without associated effusion.  General appearance :adequately nourished; in no distress.  As per CDC Guidelines ,Epic documents obesity as being present . Eyes: No conjunctival inflammation or scleral icterus is present. Heart:  Normal rate and regular rhythm. S1 and S2 normal without gallop, murmur, click, rub or other extra sounds   Lungs:Chest clear to auscultation; no wheezes, rhonchi,rales ,or rubs present.No increased work of breathing.  Abdomen: bowel sounds normal, soft and non-tender without masses, organomegaly or hernias noted.  No guarding or rebound. No flank tenderness to percussion. Skin:Warm & dry.  Intact without suspicious lesions or rashes ; no jaundice or tenting Lymphatic: No lymphadenopathy is noted about the head, neck, axilla Neurologic exam : Strength equal & normal in upper & lower extremities Able to walk on heels and toes.   Balance normal.Neg SLR           Assessment & Plan:  #1  atypical RLE pain only when supine @ night See AVS

## 2014-02-25 NOTE — Progress Notes (Signed)
Pre visit review using our clinic review tool, if applicable. No additional management support is needed unless otherwise documented below in the visit note. 

## 2014-08-01 ENCOUNTER — Ambulatory Visit (INDEPENDENT_AMBULATORY_CARE_PROVIDER_SITE_OTHER): Payer: BC Managed Care – PPO | Admitting: Family

## 2014-08-01 ENCOUNTER — Encounter: Payer: Self-pay | Admitting: Family

## 2014-08-01 VITALS — BP 152/82 | HR 64 | Temp 98.0°F | Resp 18 | Ht 62.0 in | Wt 180.1 lb

## 2014-08-01 DIAGNOSIS — T50905A Adverse effect of unspecified drugs, medicaments and biological substances, initial encounter: Secondary | ICD-10-CM | POA: Insufficient documentation

## 2014-08-01 DIAGNOSIS — T887XXA Unspecified adverse effect of drug or medicament, initial encounter: Secondary | ICD-10-CM

## 2014-08-01 MED ORDER — PREDNISONE 10 MG PO TABS: ORAL_TABLET | ORAL | Status: DC

## 2014-08-01 NOTE — Progress Notes (Signed)
Pre visit review using our clinic review tool, if applicable. No additional management support is needed unless otherwise documented below in the visit note. 

## 2014-08-01 NOTE — Progress Notes (Signed)
Subjective:    Patient ID: Sylvia Davies, female    DOB: Aug 30, 1960, 54 y.o.   MRN: [id number]  Chief Complaint  Patient presents with  . Eye irritation    started eye drops the end of decemeber for 10 days stopped and her eye started running again sunday and she started taking the eye drops again, did not start having a reaction until the 2nd round, right eye is red swollen and very itchy    HPI:  Sylvia Davies is a 54 y.o. female who presents today for an acute visit.  Associated symptoms of redness, swelling and extreme itchiness of her right eye have been going on for about 3 days. Notes there has been clear discharge. Has tried neosporin which provided little relief. Denies any trauma. Believes this may be the result of Zylet eye drops. States she was given Zylet eye drops and used them in December without any problems. Recently began experiencing symptoms and started to use the medication again and now these symptoms have developed.   Allergies  Allergen Reactions  . Amlodipine Besy-Benazepril Hcl     REACTION: itch and sore throat  . Cleocin [Clindamycin Hcl] Itching  . Hyoscyamine   . Penicillins   . Tramadol     Current Outpatient Prescriptions on File Prior to Visit  Medication Sig Dispense Refill  . aspirin 81 MG tablet Take 81 mg by mouth daily.      [name] atorvastatin (LIPITOR) 10 MG tablet Take 1 tablet (10 mg total) by mouth daily at 6 PM. 90 tablet 3  . benazepril (LOTENSIN) 20 MG tablet Take 1 tablet (20 mg total) by mouth daily. 90 tablet 3  . hydrocortisone (ANUSOL-HC) 25 MG suppository Place 25 mg rectally every 12 (twelve) hours as needed.    [name] ketoconazole (NIZORAL) 2 % cream Apply 1 application topically daily. 30 g 1  . naproxen (NAPROSYN) 500 MG tablet Take 1 tablet (500 mg total) by mouth 2 (two) times daily with a meal. 60 tablet 5  . saccharomyces boulardii (FLORASTOR) 250 MG capsule Take 1 capsule (250 mg total) by mouth 2 (two) times daily. 2  capsule 0  . triamcinolone cream (KENALOG) 0.1 % Apply 1 application topically 2 (two) times daily as needed.     No current facility-administered medications on file prior to visit.    Review of Systems  Constitutional: Negative for fever and chills.  Eyes: Positive for discharge, redness and itching. Negative for photophobia, pain and visual disturbance.      Objective:    BP 152/82 mmHg  Pulse 64  Temp(Src) 98 F (36.7 C) (Oral)  Resp 18  Ht 5\' 2"  (1.575 m)  Wt 180 lb 1.9 oz (81.702 kg)  BMI 32.94 kg/m2  SpO2 96% Nursing note and vital signs reviewed.  Physical Exam  Constitutional: She is oriented to person, place, and time. She appears well-developed and well-nourished. No distress.  Eyes: Conjunctivae, EOM and lids are normal. Pupils are equal, round, and reactive to light. Lids are everted and swept, no foreign bodies found.    Cardiovascular: Normal rate, regular rhythm, normal heart sounds and intact distal pulses.   Pulmonary/Chest: Effort normal and breath sounds normal.  Neurological: She is alert and oriented to person, place, and time.  Skin: Skin is warm and dry.  Psychiatric: She has a normal mood and affect. Her behavior is normal. Judgment and thought content normal.       Assessment & Plan:

## 2014-08-01 NOTE — Assessment & Plan Note (Signed)
Symptoms and exam consistent with allergic reaction to Zylet. No signs of anaphylaxis noted. Patient has discontinued the medication. Start prednisone taper. Follow up if symptoms worsen or fail to improve.

## 2014-08-01 NOTE — Patient Instructions (Addendum)
Thank you for choosing [facility].  Summary/Instructions:  Your prescription(s) have been submitted to your pharmacy or been printed and provided for you. Please take as directed and contact our office if you believe you are having problem(s) with the medication(s) or have any questions.  If your symptoms worsen or fail to improve, please contact our office for further instruction, or in case of emergency go directly to the emergency room at the closest medical facility.    Drug Allergy Allergic reactions to medicines are common. Some allergic reactions are mild. A delayed type of drug allergy that occurs 1 week or more after exposure to a medicine or vaccine is called serum sickness. A life-threatening, sudden (acute) allergic reaction that involves the whole body is called anaphylaxis. CAUSES  "True" drug allergies occur when there is an allergic reaction to a medicine. This is caused by overactivity of the immune system. First, the body becomes sensitized. The immune system is triggered by your first exposure to the medicine. Following this first exposure, future exposure to the same medicine may be life-threatening. Almost any medicine can cause an allergic reaction. Common ones are:  Penicillin.  Sulfonamides (sulfa drugs).  Local anesthetics.  X-ray dyes that contain iodine. SYMPTOMS  Common symptoms of a minor allergic reaction are:  Swelling around the mouth.  An itchy red rash or hives.  Vomiting or diarrhea. Anaphylaxis can cause swelling of the mouth and throat. This makes it difficult to breathe and swallow. Severe reactions can be fatal within seconds, even after exposure to only a trace amount of the drug that causes the reaction. HOME CARE INSTRUCTIONS   If you are unsure of what caused your reaction, keep a diary of foods and medicines used. Include the symptoms that followed. Avoid anything that causes reactions.  You may want to follow up with an allergy  specialist after the reaction has cleared in order to be tested to confirm the allergy. It is important to confirm that your reaction is an allergy, not just a side effect to the medicine. If you have a true allergy to a medicine, this may prevent that medicine and related medicines from being given to you when you are very ill.  If you have hives or a rash:  Take medicines as directed by your caregiver.  You may use an over-the-counter antihistamine (diphenhydramine) as needed.  Apply cold compresses to the skin or take baths in cool water. Avoid hot baths or showers.  If you are severely allergic:  Continuous observation after a severe reaction may be needed. Hospitalization is often required.  Wear a medical alert bracelet or necklace stating your allergy.  You and your family must learn how to use an anaphylaxis kit or give an epinephrine injection to temporarily treat an emergency allergic reaction. If you have had a severe reaction, always carry your epinephrine injection or anaphylaxis kit with you. This can be lifesaving if you have a severe reaction.  Do not drive or perform tasks after treatment until the medicines used to treat your reaction have worn off, or until your caregiver says it is okay. SEEK MEDICAL CARE IF:   You think you had an allergic reaction. Symptoms usually start within 30 minutes after exposure.  Symptoms are getting worse rather than better.  You develop new symptoms.  The symptoms that brought you to your caregiver return. SEEK IMMEDIATE MEDICAL CARE IF:   You have swelling of the mouth, difficulty breathing, or wheezing.  You have a  tight feeling in your chest or throat.  You develop hives, swelling, or itching all over your body.  You develop severe vomiting or diarrhea.  You feel faint or pass out. This is an emergency. Use your epinephrine injection or anaphylaxis kit as you have been instructed. Call for emergency medical help. Even if you  improve after the injection, you need to be examined at a hospital emergency department. MAKE SURE YOU:   Understand these instructions.  Will watch your condition.  Will get help right away if you are not doing well or get worse. Document Released: 06/28/2005 Document Revised: 09/20/2011 Document Reviewed: 12/02/2010 [facility] Patient Information 2015 [place], [place]. This information is not intended to replace advice given to you by your health care provider. Make sure you discuss any questions you have with your health care provider.

## 2014-09-16 ENCOUNTER — Ambulatory Visit (INDEPENDENT_AMBULATORY_CARE_PROVIDER_SITE_OTHER): Payer: BC Managed Care – PPO | Admitting: Internal Medicine

## 2014-09-16 ENCOUNTER — Encounter: Payer: Self-pay | Admitting: Internal Medicine

## 2014-09-16 VITALS — BP 160/92 | HR 73 | Temp 98.3°F | Resp 16 | Ht 62.0 in | Wt 179.0 lb

## 2014-09-16 DIAGNOSIS — I10 Essential (primary) hypertension: Secondary | ICD-10-CM

## 2014-09-16 DIAGNOSIS — L500 Allergic urticaria: Secondary | ICD-10-CM

## 2014-09-16 MED ORDER — AMLODIPINE BESYLATE 5 MG PO TABS
5.0000 mg | ORAL_TABLET | Freq: Every day | ORAL | Status: DC
Start: 2014-09-16 — End: 2015-06-06

## 2014-09-16 NOTE — Progress Notes (Signed)
   Subjective:    Patient ID: Sylvia Davies, female    DOB: 05/17/1961, 54 y.o.   MRN: [id number]  HPI  She was seen 08/01/14 for an itchy wheal below the right eye; this was in the context of using Zylet eyedrops which she started early January for watery eyes. The itchy lesion began 07/29/14. Despite stopping the eyedrops she continues to have the wheal on the right. There is early beginnings of similar process on the left as of 3/4.  She has been applying Neosporin to this rash.  She specifically denied the ingestion of any hyperallergenic foods with the use of any new cosmetics prior to onset of symptoms.  She is on an ACE inhibitor; but has this listed asa an intolerance in combo with CCB.  Review of Systems  OD continues to be  watery .  Swelling of the lips or tongue or intraoral lesions denied.  Shortness of breath, wheezing, or cough absent.  No vesicles or pustules noted.  Fever ,chills , or sweats denied.   Diarrhea not present.  No dysuria, pyuria or hematuria.    Objective:   Physical Exam  General appearance:Adequately nourished; no acute distress or increased work of breathing is present.  No  lymphadenopathy about the head, neck, or axilla noted.   Eyes: No conjunctival inflammation or lid edema is present. There is no scleral icterus. Four X 1.5 centimeter raised wheal below the right eye. Extraocular motion is intact without pain. Vision is normal to confrontation ;normal field of vision .  Ears:  External ear exam shows no significant lesions or deformities.  Otoscopic examination reveals clear canals, tympanic membranes are intact bilaterally without bulging, retraction, inflammation or discharge.  Nose:  External nasal examination shows no deformity or inflammation. Nasal mucosa are pink and moist without lesions or exudates. No septal dislocation or deviation.No obstruction to airflow.   Oral exam: Dental hygiene is good; lips and gums are healthy  appearing.There is no oropharyngeal erythema or exudate noted.   Neck:  No deformities, thyromegaly, masses, or tenderness noted.   Supple with full range of motion without pain.   Heart:  Normal rate and regular rhythm. S1 and S2 normal without gallop, murmur, click, rub or other extra sounds.   Lungs:Chest clear to auscultation; no wheezes, rhonchi,rales ,or rubs present.  Extremities:  No cyanosis, edema, or clubbing  noted    Skin: Warm & dry w/o jaundice or tenting.      Assessment & Plan:  #1 urticarial lesion below the right eye; rule out relationship to ACE inhibitor   #2 hypertension, uncontrolled  Plan: See orders and recommendations

## 2014-09-16 NOTE — Patient Instructions (Addendum)
Urticaria or hives could be related to the benazepril blood pressure medicine. This should be held and blood pressure monitored. Start the new blood pressure medicine in place of the Benazepril Avoid soaps and cosmetics which are not hypoallergenic. Restrict hyperallergenic foods at this time: Nuts, strawberries, seafood , chocolate, and tomatoes.   If you have any skin eruption; please document any dietary , medicinal, chemical or environmental exposures in the previous 8-12 hours.  Apply Cort Aid OTC twice a day to the involved tissues. Do not get this topical steroid into eyes.   Zyrtec 10 mg @ bedtime  as needed for itchy eyes & sneezing.

## 2014-09-16 NOTE — Progress Notes (Signed)
Pre visit review using our clinic review tool, if applicable. No additional management support is needed unless otherwise documented below in the visit note. 

## 2014-09-17 ENCOUNTER — Telehealth: Payer: Self-pay | Admitting: Internal Medicine

## 2014-09-17 NOTE — Telephone Encounter (Signed)
emmi mailed  °

## 2014-10-14 ENCOUNTER — Other Ambulatory Visit: Payer: Self-pay

## 2014-10-14 DIAGNOSIS — Z1231 Encounter for screening mammogram for malignant neoplasm of breast: Secondary | ICD-10-CM

## 2014-11-18 ENCOUNTER — Ambulatory Visit
Admission: RE | Admit: 2014-11-18 | Discharge: 2014-11-18 | Disposition: A | Discharge status: Home / Self Care | Payer: BC Managed Care – PPO | Source: Ambulatory Visit

## 2014-11-18 DIAGNOSIS — Z1231 Encounter for screening mammogram for malignant neoplasm of breast: Secondary | ICD-10-CM

## 2014-12-24 ENCOUNTER — Other Ambulatory Visit: Payer: Self-pay | Admitting: Internal Medicine

## 2014-12-30 ENCOUNTER — Other Ambulatory Visit: Payer: Self-pay | Admitting: Internal Medicine

## 2015-01-14 ENCOUNTER — Ambulatory Visit (INDEPENDENT_AMBULATORY_CARE_PROVIDER_SITE_OTHER): Payer: BC Managed Care – PPO | Admitting: Internal Medicine

## 2015-01-14 ENCOUNTER — Encounter: Payer: Self-pay | Admitting: Internal Medicine

## 2015-01-14 ENCOUNTER — Other Ambulatory Visit (INDEPENDENT_AMBULATORY_CARE_PROVIDER_SITE_OTHER): Payer: BC Managed Care – PPO

## 2015-01-14 VITALS — BP 132/78 | HR 79 | Temp 98.6°F | Ht 62.0 in | Wt 183.0 lb

## 2015-01-14 DIAGNOSIS — Z23 Encounter for immunization: Secondary | ICD-10-CM | POA: Diagnosis not present

## 2015-01-14 DIAGNOSIS — Z0000 Encounter for general adult medical examination without abnormal findings: Secondary | ICD-10-CM | POA: Diagnosis not present

## 2015-01-14 DIAGNOSIS — R7302 Impaired glucose tolerance (oral): Secondary | ICD-10-CM

## 2015-01-14 DIAGNOSIS — R21 Rash and other nonspecific skin eruption: Secondary | ICD-10-CM

## 2015-01-14 LAB — CBC WITH DIFFERENTIAL/PLATELET
BASOS PCT: 0.8 % (ref 0.0–3.0)
Basophils Absolute: 0.1 10*3/uL (ref 0.0–0.1)
Eosinophils Absolute: 0.1 10*3/uL (ref 0.0–0.7)
Eosinophils Relative: 1.5 % (ref 0.0–5.0)
HCT: 40.9 % (ref 36.0–46.0)
HEMOGLOBIN: 13.6 g/dL (ref 12.0–15.0)
Lymphocytes Relative: 26.6 % (ref 12.0–46.0)
Lymphs Abs: 1.8 10*3/uL (ref 0.7–4.0)
MCHC: 33.3 g/dL (ref 30.0–36.0)
MCV: 82.8 fl (ref 78.0–100.0)
MONOS PCT: 5.5 % (ref 3.0–12.0)
Monocytes Absolute: 0.4 10*3/uL (ref 0.1–1.0)
NEUTROS ABS: 4.4 10*3/uL (ref 1.4–7.7)
Neutrophils Relative %: 65.6 % (ref 43.0–77.0)
Platelets: 230 10*3/uL (ref 150.0–400.0)
RBC: 4.94 Mil/uL (ref 3.87–5.11)
RDW: 14.8 % (ref 11.5–15.5)
WBC: 6.7 10*3/uL (ref 4.0–10.5)

## 2015-01-14 LAB — URINALYSIS, ROUTINE W REFLEX MICROSCOPIC
BILIRUBIN URINE: NEGATIVE
KETONES UR: NEGATIVE
LEUKOCYTES UA: NEGATIVE
Nitrite: NEGATIVE
Specific Gravity, Urine: <=1.005 — AB (ref 1.000–1.030)
Total Protein, Urine: NEGATIVE
URINE GLUCOSE: NEGATIVE
UROBILINOGEN UA: 0.2 (ref 0.0–1.0)
WBC, UA: NONE SEEN (ref 0–?)
pH: 6 (ref 5.0–8.0)

## 2015-01-14 LAB — HEPATIC FUNCTION PANEL
ALBUMIN: 3.9 g/dL (ref 3.5–5.2)
ALT: 10 U/L (ref 0–35)
AST: 14 U/L (ref 0–37)
Alkaline Phosphatase: 107 U/L (ref 39–117)
BILIRUBIN DIRECT: 0 mg/dL (ref 0.0–0.3)
Total Bilirubin: 0.3 mg/dL (ref 0.2–1.2)
Total Protein: 7.7 g/dL (ref 6.0–8.3)

## 2015-01-14 LAB — LIPID PANEL
CHOL/HDL RATIO: 3
CHOLESTEROL: 143 mg/dL (ref 0–200)
HDL: 44.6 mg/dL (ref >39.00)
LDL CALC: 85 mg/dL (ref 0–99)
NonHDL: 98.4
Triglycerides: 67 mg/dL (ref 0.0–149.0)
VLDL: 13.4 mg/dL (ref 0.0–40.0)

## 2015-01-14 LAB — BASIC METABOLIC PANEL
BUN: 12 mg/dL (ref 6–23)
CO2: 28 mEq/L (ref 19–32)
CREATININE: 0.79 mg/dL (ref 0.40–1.20)
Calcium: 9.4 mg/dL (ref 8.4–10.5)
Chloride: 104 mEq/L (ref 96–112)
GFR: 96.98 mL/min (ref >60.00)
GLUCOSE: 82 mg/dL (ref 70–99)
POTASSIUM: 3.8 meq/L (ref 3.5–5.1)
Sodium: 139 mEq/L (ref 135–145)

## 2015-01-14 LAB — TSH: TSH: 1.25 u[IU]/mL (ref 0.35–4.50)

## 2015-01-14 LAB — HEMOGLOBIN A1C: Hgb A1c MFr Bld: 5.8 % (ref 4.6–6.5)

## 2015-01-14 MED ORDER — TRIAMCINOLONE ACETONIDE 0.1 % EX CREA: 1.0000 "application " | TOPICAL_CREAM | Freq: Two times a day (BID) | CUTANEOUS | Status: DC | PRN

## 2015-01-14 NOTE — Addendum Note (Signed)
Addended by: Lyman Bishop on: 01/14/2015 02:18 PM   Modules accepted: Orders

## 2015-01-14 NOTE — Progress Notes (Signed)
Subjective:    Patient ID: Sylvia Davies, female    DOB: [birth year], 54 y.o.   MRN: [id number]  HPI  Here for wellness and f/u;  Overall doing ok;  Pt denies Chest pain, worsening SOB, DOE, wheezing, orthopnea, PND, worsening LE edema, palpitations, dizziness or syncope.  Pt denies neurological change such as new headache, facial or extremity weakness.  Pt denies polydipsia, polyuria, or low sugar symptoms. Pt states overall good compliance with treatment and medications, good tolerability, and has been trying to follow appropriate diet.  Pt denies worsening depressive symptoms, suicidal ideation or panic. No fever, night sweats, wt loss, loss of appetite, or other constitutional symptoms.  Pt states good ability with ADL's, has low fall risk, home safety reviewed and adequate, no other significant changes in hearing or vision, and only occasionally active with exercise.  Hard to lose wt. Wt Readings from Last 3 Encounters:  01/14/15 183 lb (83.008 kg)  09/16/14 179 lb (81.194 kg)  08/01/14 180 lb 1.9 oz (81.702 kg)  Had hives with ACEI last visit now resolved.   Past Medical History  Diagnosis Date  . MITRAL REGURGITATION, 0 (MILD) 04/02/2007  . HYPERTENSION, BENIGN ESSENTIAL 05/04/2007  . HYPERLIPIDEMIA 04/02/2007  . GERD 04/02/2007  . ALLERGIC RHINITIS 04/02/2007  . OBESITY 04/02/2007  . MIGRAINE HEADACHE 04/02/2007  . Irritable bowel syndrome 04/02/2007  . FIBROCYSTIC BREAST DISEASE, HX OF 04/02/2007  . BREAST BIOPSY, HX OF 04/02/2007  . Impaired glucose tolerance 11/08/2012  . Diverticulosis   . Hemorrhoids    Past Surgical History  Procedure Laterality Date  . Breast biopsy Left   . Leg surgery Left     have rods and srcews    reports that she has never smoked. She has never used smokeless tobacco. She reports that she does not drink alcohol or use illicit drugs. family history includes Diabetes in her maternal grandmother; Hypertension in her mother. Allergies  Allergen  Reactions  . Amlodipine Besy-Benazepril Hcl     REACTION: itch and sore throat  . Cleocin [Clindamycin Hcl] Itching  . Hyoscyamine   . Penicillins   . Tramadol   . Zylet [Loteprednol-Tobramycin] Itching   Current Outpatient Prescriptions on File Prior to Visit  Medication Sig Dispense Refill  . amLODipine (NORVASC) 5 MG tablet Take 1 tablet (5 mg total) by mouth daily. 90 tablet 3  . aspirin 81 MG tablet Take 81 mg by mouth daily.      [name] atorvastatin (LIPITOR) 10 MG tablet TAKE 1 TABLET (10 MG TOTAL) BY MOUTH DAILY AT 6 PM. 90 tablet 0  . hydrocortisone (ANUSOL-HC) 25 MG suppository Place 25 mg rectally every 12 (twelve) hours as needed.    . saccharomyces boulardii (FLORASTOR) 250 MG capsule Take 1 capsule (250 mg total) by mouth 2 (two) times daily. 2 capsule 0  . triamcinolone cream (KENALOG) 0.1 % Apply 1 application topically 2 (two) times daily as needed.     No current facility-administered medications on file prior to visit.      Review of Systems Constitutional: Negative for increased diaphoresis, other activity, appetite or siginficant weight change other than noted HENT: Negative for worsening hearing loss, ear pain, facial swelling, mouth sores and neck stiffness.   Eyes: Negative for other worsening pain, redness or visual disturbance.  Respiratory: Negative for shortness of breath and wheezing  Cardiovascular: Negative for chest pain and palpitations.  Gastrointestinal: Negative for diarrhea, blood in stool, abdominal distention or other pain Genitourinary: Negative for  hematuria, flank pain or change in urine volume.  Musculoskeletal: Negative for myalgias or other joint complaints.  Skin: Negative for color change and wound or drainage.  Neurological: Negative for syncope and numbness. other than noted Hematological: Negative for adenopathy. or other swelling Psychiatric/Behavioral: Negative for hallucinations, SI, self-injury, decreased concentration or other  worsening agitation.      Objective:   Physical Exam BP 132/78 mmHg  Pulse 79  Temp(Src) 98.6 F (37 C) (Oral)  Ht 5\' 2"  (1.575 m)  Wt 183 lb (83.008 kg)  BMI 33.46 kg/m2  SpO2 97%  LMP 07/31/2013 VS noted,  Constitutional: Pt is oriented to person, place, and time. Appears well-developed and well-nourished, in no significant distress Head: Normocephalic and atraumatic.  Right Ear: External ear normal.  Left Ear: External ear normal.  Nose: Nose normal.  Mouth/Throat: Oropharynx is clear and moist.  Eyes: Conjunctivae and EOM are normal. Pupils are equal, round, and reactive to light.  Neck: Normal range of motion. Neck supple. No JVD present. No tracheal deviation present or significant neck LA or mass Cardiovascular: Normal rate, regular rhythm, normal heart sounds and intact distal pulses.   Pulmonary/Chest: Effort normal and breath sounds without rales or wheezing  Abdominal: Soft. Bowel sounds are normal. NT. No HSM  Musculoskeletal: Normal range of motion. Exhibits no edema.  Lymphadenopathy:  Has no cervical adenopathy.  Neurological: Pt is alert and oriented to person, place, and time. Pt has normal reflexes. No cranial nerve deficit. Motor grossly intact Skin: Skin is warm and dry. Left arm smalla area typical contact dermatiits type rash noted.  Psychiatric:  Has normal mood and affect. Behavior is normal.         Assessment & Plan:

## 2015-01-14 NOTE — Assessment & Plan Note (Signed)

## 2015-01-14 NOTE — Progress Notes (Signed)
Pre visit review using our clinic review tool, if applicable. No additional management support is needed unless otherwise documented below in the visit note. 

## 2015-01-14 NOTE — Assessment & Plan Note (Signed)
For triam cream prn,  to f/u any worsening symptoms or concerns

## 2015-01-14 NOTE — Patient Instructions (Addendum)
You had the tetanus Tdap shot today  Please continue all other medications as before, and refills have been done if requested.  Please have the pharmacy call with any other refills you may need.  Please continue your efforts at being more active, low cholesterol diet, and weight control.  You are otherwise up to date with prevention measures today.  Please keep your appointments with your specialists as you may have planned  Please go to the LAB in the Basement (turn left off the elevator) for the tests to be done today  You will be contacted by phone if any changes need to be made immediately.  Otherwise, you will receive a letter about your results with an explanation, but please check with MyChart first.  Please remember to sign up for MyChart if you have not done so, as this will be important to you in the future with finding out test results, communicating by private email, and scheduling acute appointments online when needed.  Please return in 1 year for your yearly visit, or sooner if needed, with Lab testing done 3-5 days before  

## 2015-01-14 NOTE — Assessment & Plan Note (Signed)
stable overall by history and exam, recent data reviewed with pt, and pt to continue medical treatment as before,  to f/u any worsening symptoms or concerns le Lab Results  Component Value Date   HGBA1C 6.0 12/11/2013

## 2015-01-16 ENCOUNTER — Telehealth: Payer: Self-pay | Admitting: Internal Medicine

## 2015-01-16 NOTE — Telephone Encounter (Signed)
Rec'd from Willaim Rayas MD forward 3 pages to [place]

## 2015-04-01 ENCOUNTER — Encounter: Payer: Self-pay | Admitting: Internal Medicine

## 2015-04-04 ENCOUNTER — Other Ambulatory Visit: Payer: Self-pay | Admitting: Internal Medicine

## 2015-06-05 ENCOUNTER — Other Ambulatory Visit: Payer: Self-pay | Admitting: Internal Medicine

## 2015-06-06 ENCOUNTER — Other Ambulatory Visit: Payer: Self-pay | Admitting: Emergency Medicine

## 2015-06-06 DIAGNOSIS — I10 Essential (primary) hypertension: Secondary | ICD-10-CM

## 2015-06-06 MED ORDER — AMLODIPINE BESYLATE 5 MG PO TABS: 5.0000 mg | ORAL_TABLET | Freq: Every day | ORAL | Status: DC

## 2015-06-21 ENCOUNTER — Ambulatory Visit (INDEPENDENT_AMBULATORY_CARE_PROVIDER_SITE_OTHER): Payer: BC Managed Care – PPO | Admitting: Family Medicine

## 2015-06-21 VITALS — BP 120/82 | HR 66 | Temp 98.1°F | Resp 20 | Ht 62.5 in | Wt 173.1 lb

## 2015-06-21 DIAGNOSIS — K0510 Chronic gingivitis, plaque induced: Secondary | ICD-10-CM | POA: Diagnosis not present

## 2015-06-21 MED ORDER — DOXYCYCLINE HYCLATE 100 MG PO TABS: 100.0000 mg | ORAL_TABLET | Freq: Two times a day (BID) | ORAL | Status: DC

## 2015-06-21 NOTE — Progress Notes (Signed)
This chart was scribed for Robyn Haber, MD by [facility], medical scribe at [facility].The patient was seen in exam room 08 and the patient's care was started at 1:01 PM.  Patient ID: Sylvia Davies MRN: [id number], DOB: 1961-02-26, 54 y.o. Date of Encounter: 06/21/2015  Primary Physician: Cathlean Cower, MD  Chief Complaint:  Chief Complaint  Patient presents with  . Facial Swelling    Right Jaw-Woke up with it this morning   HPI:  Sylvia Davies is a 54 y.o. female who presents to [facility] concerned about right facial swelling, onset this morning. Has a temporary crown placed in October.  Past Medical History  Diagnosis Date  . MITRAL REGURGITATION, 0 (MILD) 04/02/2007  . HYPERTENSION, BENIGN ESSENTIAL 05/04/2007  . HYPERLIPIDEMIA 04/02/2007  . GERD 04/02/2007  . ALLERGIC RHINITIS 04/02/2007  . OBESITY 04/02/2007  . MIGRAINE HEADACHE 04/02/2007  . Irritable bowel syndrome 04/02/2007  . FIBROCYSTIC BREAST DISEASE, HX OF 04/02/2007  . BREAST BIOPSY, HX OF 04/02/2007  . Impaired glucose tolerance 11/08/2012  . Diverticulosis   . Hemorrhoids     Home Meds: Prior to Admission medications   Medication Sig Start Date End Date Taking? Authorizing Provider  amLODipine (NORVASC) 5 MG tablet Take 1 tablet (5 mg total) by mouth daily. 06/06/15  Yes Biagio Borg, MD  aspirin 81 MG tablet Take 81 mg by mouth daily.     Yes Historical Provider, MD  atorvastatin (LIPITOR) 10 MG tablet TAKE 1 TABLET (10 MG TOTAL) BY MOUTH DAILY AT 6 PM. 04/04/15  Yes Biagio Borg, MD  CAMILA 0.35 MG tablet  12/10/14  Yes Historical Provider, MD  clobetasol ointment (TEMOVATE) 0.05 %  12/10/14  Yes Historical Provider, MD  hydrocortisone (ANUSOL-HC) 25 MG suppository Place 25 mg rectally every 12 (twelve) hours as needed. 12/20/12  Yes Biagio Borg, MD  saccharomyces boulardii (FLORASTOR) 250 MG capsule Take 1 capsule (250 mg total) by mouth 2 (two) times daily.  03/02/13  Yes Irene Shipper, MD  triamcinolone cream (KENALOG) 0.1 % Apply 1 application topically 2 (two) times daily as needed. 01/14/15  Yes Biagio Borg, MD   Allergies:  Allergies  Allergen Reactions  . Amlodipine Besy-Benazepril Hcl     REACTION: itch and sore throat  . Cleocin [Clindamycin Hcl] Itching  . Hyoscyamine   . Penicillins   . Tramadol   . Zylet [Loteprednol-Tobramycin] Itching   Social History   Social History  . Marital Status: Married    Spouse Name: N/A  . Number of Children: 2  . Years of Education: N/A   Occupational History  . [employer] History Main Topics  . Smoking status: Never Smoker   . Smokeless tobacco: Never Used  . Alcohol Use: No  . Drug Use: No  . Sexual Activity: Not on file   Other Topics Concern  . Not on file   Social History Narrative    Review of Systems: Constitutional: negative for chills, fever, night sweats, weight changes, or fatigue  HEENT: negative for vision changes, hearing loss, congestion, rhinorrhea, ST, epistaxis, or sinus pressure. Positive for facial swelling. Cardiovascular: negative for chest pain or palpitations Respiratory: negative for hemoptysis, wheezing, shortness of breath, or cough Abdominal: negative for abdominal pain, nausea, vomiting, diarrhea, or constipation Dermatological: negative for rash Neurologic: negative for headache, dizziness, or syncope All other systems reviewed and are otherwise negative with  the exception to those above and in the HPI.  Physical Exam: Blood pressure 120/82, pulse 66, temperature 98.1 F (36.7 C), temperature source Oral, resp. rate 20, height 5' 2.5" (1.588 m), weight 173 lb 2 oz (78.529 kg), last menstrual period 07/31/2013, SpO2 98 %., Body mass index is 31.14 kg/(m^2). General: Well developed, well nourished, in no acute distress. Head: Normocephalic, atraumatic, eyes without discharge, sclera non-icteric, nares are without  discharge. Bilateral auditory canals clear, TM's are without perforation, pearly grey and translucent with reflective cone of light bilaterally. Oral cavity moist, posterior pharynx without exudate, erythema, peritonsillar abscess, or post nasal drip. Mild swelling of the gingiva right side next to tooth #32 Neck: Supple. No thyromegaly. Full ROM. No lymphadenopathy. Lungs: Clear bilaterally to auscultation without wheezes, rales, or rhonchi. Breathing is unlabored. Heart: RRR with S1 S2. No murmurs, rubs, or gallops appreciated. Abdomen: Soft, non-tender, non-distended with normoactive bowel sounds. No hepatomegaly. No rebound/guarding. No obvious abdominal masses. Msk:  Strength and tone normal for age. Extremities/Skin: Warm and dry. No clubbing or cyanosis. No edema. No rashes or suspicious lesions. Neuro: Alert and oriented X 3. Moves all extremities spontaneously. Gait is normal. CNII-XII grossly in tact. Psych:  Responds to questions appropriately with a normal affect.   ASSESSMENT AND PLAN:  54 y.o. year old female with  This chart was scribed in my presence and reviewed by me personally.    ICD-9-CM ICD-10-CM   1. Gingivitis 523.10 K05.10 doxycycline (VIBRA-TABS) 100 MG tablet   Referred to dentist By signing my name below, I, Nadim Abuhashem, attest that this documentation has been prepared under the direction and in the presence of Robyn Haber, MD.  Electronically Signed: Lora Havens, medical scribe. 06/21/2015 1:07 PM.

## 2015-06-21 NOTE — Patient Instructions (Signed)
Dr. Jonna Coup   [phone]

## 2015-06-30 ENCOUNTER — Other Ambulatory Visit: Payer: Self-pay | Admitting: Family Medicine

## 2015-10-05 ENCOUNTER — Other Ambulatory Visit: Payer: Self-pay | Admitting: Internal Medicine

## 2015-10-14 ENCOUNTER — Other Ambulatory Visit: Payer: Self-pay

## 2015-10-14 DIAGNOSIS — Z1231 Encounter for screening mammogram for malignant neoplasm of breast: Secondary | ICD-10-CM

## 2015-10-18 ENCOUNTER — Ambulatory Visit (INDEPENDENT_AMBULATORY_CARE_PROVIDER_SITE_OTHER): Payer: BC Managed Care – PPO | Admitting: Family Medicine

## 2015-10-18 VITALS — BP 156/74 | HR 77 | Temp 97.9°F | Resp 18 | Ht 63.0 in | Wt 172.0 lb

## 2015-10-18 DIAGNOSIS — B029 Zoster without complications: Secondary | ICD-10-CM | POA: Diagnosis not present

## 2015-10-18 MED ORDER — HYDROCODONE-ACETAMINOPHEN 5-325 MG PO TABS: 1.0000 | ORAL_TABLET | Freq: Four times a day (QID) | ORAL | Status: DC | PRN

## 2015-10-18 NOTE — Patient Instructions (Addendum)

## 2015-10-18 NOTE — Progress Notes (Signed)
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  Preceded by pain and now keeping her awake  Objective:  BP 156/74 mmHg  Pulse 77  Temp(Src) 97.9 F (36.6 C) (Oral)  Resp 18  Ht 5\' 3"  (1.6 m)  Wt 172 lb (78.019 kg)  BMI 30.48 kg/m2  SpO2 97%  LMP 07/31/2013 Right flank tender vesicular rash.  Assessment: Shingles - Plan: HYDROcodone-acetaminophen (NORCO) 5-325 MG tablet  Robyn Haber, MD

## 2015-11-19 ENCOUNTER — Ambulatory Visit
Admission: RE | Admit: 2015-11-19 | Discharge: 2015-11-19 | Disposition: A | Discharge status: Home / Self Care | Payer: BC Managed Care – PPO | Source: Ambulatory Visit

## 2015-11-19 DIAGNOSIS — Z1231 Encounter for screening mammogram for malignant neoplasm of breast: Secondary | ICD-10-CM

## 2015-11-27 ENCOUNTER — Other Ambulatory Visit: Payer: Self-pay | Admitting: Nurse Practitioner

## 2015-11-27 ENCOUNTER — Other Ambulatory Visit (HOSPITAL_COMMUNITY)
Admission: RE | Admit: 2015-11-27 | Discharge: 2015-11-27 | Disposition: A | Discharge status: Home / Self Care | Payer: BC Managed Care – PPO | Source: Ambulatory Visit | Attending: Nurse Practitioner | Admitting: Nurse Practitioner

## 2015-11-27 DIAGNOSIS — Z1151 Encounter for screening for human papillomavirus (HPV): Secondary | ICD-10-CM | POA: Diagnosis present

## 2015-11-27 DIAGNOSIS — Z01411 Encounter for gynecological examination (general) (routine) with abnormal findings: Secondary | ICD-10-CM | POA: Insufficient documentation

## 2015-12-01 LAB — CYTOLOGY - PAP

## 2015-12-25 ENCOUNTER — Other Ambulatory Visit: Payer: Self-pay | Admitting: Nurse Practitioner

## 2016-01-02 ENCOUNTER — Ambulatory Visit (INDEPENDENT_AMBULATORY_CARE_PROVIDER_SITE_OTHER): Payer: BC Managed Care – PPO

## 2016-01-02 ENCOUNTER — Encounter: Payer: Self-pay | Admitting: Podiatry

## 2016-01-02 ENCOUNTER — Ambulatory Visit (INDEPENDENT_AMBULATORY_CARE_PROVIDER_SITE_OTHER): Payer: BC Managed Care – PPO | Admitting: Podiatry

## 2016-01-02 VITALS — BP 134/77 | HR 74 | Resp 12

## 2016-01-02 DIAGNOSIS — M79672 Pain in left foot: Secondary | ICD-10-CM

## 2016-01-02 DIAGNOSIS — D169 Benign neoplasm of bone and articular cartilage, unspecified: Secondary | ICD-10-CM | POA: Diagnosis not present

## 2016-01-02 DIAGNOSIS — L84 Corns and callosities: Secondary | ICD-10-CM

## 2016-01-02 DIAGNOSIS — M2042 Other hammer toe(s) (acquired), left foot: Secondary | ICD-10-CM

## 2016-01-02 NOTE — Progress Notes (Signed)
   Subjective:    Patient ID: Sylvia Davies, female    DOB: 1961-02-05, 55 y.o.   MRN: [id number]  HPI Chief Complaint  Patient presents with  . Toe Pain    ''LT FOOT 5TH TOE IS SORE.''   Lt foot 5th toe is been sore for about 4 months. Toe get worse when wearing shoes. Tried no treatment   Review of Systems  Skin: Positive for color change.       Objective:   Physical Exam        Assessment & Plan:

## 2016-01-04 NOTE — Progress Notes (Signed)
Subjective:     Patient ID: Sylvia Davies, female   DOB: Sep 01, 1960, 55 y.o.   MRN: [id number]  HPI patient points to the left fifth toe and states it's been really bothering her and making it hard to walk. She states it's been going on at least 6 months and she's tried wider shoes trimming and padding without relief   Review of Systems  All other systems reviewed and are negative.      Objective:   Physical Exam  Constitutional: She is oriented to person, place, and time.  Cardiovascular: Intact distal pulses.   Musculoskeletal: Normal range of motion.  Neurological: She is oriented to person, place, and time.  Skin: Skin is warm.  Nursing note and vitals reviewed.  neurovascular status intact muscle strength adequate range of motion within normal limits with patient found to have significant adductovarus deformity of the fifth digit left with keratotic lesion on the lateral side that's very painful when pressed and makes it hard to wear shoe gear comfortably. 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 Today debridement accomplished with padding and we discussed eventual derotational arthroplasty with exostectomy and she wants to get this done but due to her schedule needs to wait to the first week of August. She is scheduled and we will discuss the surgery in greater detail when she returns in July x-ray report indicates that there is significant adductovarus deformity of the fifth digit with exostotic area on the lateral side of the distal fifth digit

## 2016-01-15 ENCOUNTER — Ambulatory Visit (INDEPENDENT_AMBULATORY_CARE_PROVIDER_SITE_OTHER): Payer: BC Managed Care – PPO | Admitting: Internal Medicine

## 2016-01-15 ENCOUNTER — Other Ambulatory Visit (INDEPENDENT_AMBULATORY_CARE_PROVIDER_SITE_OTHER): Payer: BC Managed Care – PPO

## 2016-01-15 ENCOUNTER — Encounter: Payer: Self-pay | Admitting: Internal Medicine

## 2016-01-15 VITALS — BP 130/70 | HR 81 | Temp 98.2°F | Resp 20 | Wt 174.0 lb

## 2016-01-15 DIAGNOSIS — R6889 Other general symptoms and signs: Secondary | ICD-10-CM

## 2016-01-15 DIAGNOSIS — R7302 Impaired glucose tolerance (oral): Secondary | ICD-10-CM | POA: Diagnosis not present

## 2016-01-15 DIAGNOSIS — E785 Hyperlipidemia, unspecified: Secondary | ICD-10-CM | POA: Diagnosis not present

## 2016-01-15 DIAGNOSIS — I10 Essential (primary) hypertension: Secondary | ICD-10-CM

## 2016-01-15 DIAGNOSIS — Z0001 Encounter for general adult medical examination with abnormal findings: Secondary | ICD-10-CM

## 2016-01-15 DIAGNOSIS — Z1159 Encounter for screening for other viral diseases: Secondary | ICD-10-CM

## 2016-01-15 LAB — BASIC METABOLIC PANEL
BUN: 12 mg/dL (ref 6–23)
CHLORIDE: 103 meq/L (ref 96–112)
CO2: 29 mEq/L (ref 19–32)
Calcium: 10 mg/dL (ref 8.4–10.5)
Creatinine, Ser: 0.68 mg/dL (ref 0.40–1.20)
GFR: 114.88 mL/min (ref >60.00)
Glucose, Bld: 85 mg/dL (ref 70–99)
POTASSIUM: 4 meq/L (ref 3.5–5.1)
Sodium: 138 mEq/L (ref 135–145)

## 2016-01-15 LAB — CBC WITH DIFFERENTIAL/PLATELET
BASOS PCT: 0.4 % (ref 0.0–3.0)
Basophils Absolute: 0 10*3/uL (ref 0.0–0.1)
EOS ABS: 0.1 10*3/uL (ref 0.0–0.7)
Eosinophils Relative: 1.8 % (ref 0.0–5.0)
HCT: 39.7 % (ref 36.0–46.0)
Hemoglobin: 13.3 g/dL (ref 12.0–15.0)
Lymphocytes Relative: 31.4 % (ref 12.0–46.0)
Lymphs Abs: 2.2 10*3/uL (ref 0.7–4.0)
MCHC: 33.4 g/dL (ref 30.0–36.0)
MCV: 81.5 fl (ref 78.0–100.0)
MONO ABS: 0.4 10*3/uL (ref 0.1–1.0)
Monocytes Relative: 5.1 % (ref 3.0–12.0)
NEUTROS ABS: 4.3 10*3/uL (ref 1.4–7.7)
Neutrophils Relative %: 61.3 % (ref 43.0–77.0)
PLATELETS: 255 10*3/uL (ref 150.0–400.0)
RBC: 4.87 Mil/uL (ref 3.87–5.11)
RDW: 14.6 % (ref 11.5–15.5)
WBC: 6.9 10*3/uL (ref 4.0–10.5)

## 2016-01-15 LAB — LIPID PANEL
Cholesterol: 157 mg/dL (ref 0–200)
HDL: 46.5 mg/dL (ref >39.00)
LDL Cholesterol: 96 mg/dL (ref 0–99)
NonHDL: 110.15
TRIGLYCERIDES: 71 mg/dL (ref 0.0–149.0)
Total CHOL/HDL Ratio: 3
VLDL: 14.2 mg/dL (ref 0.0–40.0)

## 2016-01-15 LAB — URINALYSIS, ROUTINE W REFLEX MICROSCOPIC
Bilirubin Urine: NEGATIVE
Ketones, ur: NEGATIVE
Leukocytes, UA: NEGATIVE
Nitrite: NEGATIVE
TOTAL PROTEIN, URINE-UPE24: NEGATIVE
URINE GLUCOSE: NEGATIVE
Urobilinogen, UA: 0.2 (ref 0.0–1.0)
pH: 6 (ref 5.0–8.0)

## 2016-01-15 LAB — HEMOGLOBIN A1C: HEMOGLOBIN A1C: 5.8 % (ref 4.6–6.5)

## 2016-01-15 LAB — HEPATIC FUNCTION PANEL
ALT: 11 U/L (ref 0–35)
AST: 16 U/L (ref 0–37)
Albumin: 4.2 g/dL (ref 3.5–5.2)
Alkaline Phosphatase: 115 U/L (ref 39–117)
BILIRUBIN DIRECT: 0.1 mg/dL (ref 0.0–0.3)
BILIRUBIN TOTAL: 0.6 mg/dL (ref 0.2–1.2)
Total Protein: 7.7 g/dL (ref 6.0–8.3)

## 2016-01-15 LAB — TSH: TSH: 0.97 u[IU]/mL (ref 0.35–4.50)

## 2016-01-15 NOTE — Progress Notes (Signed)
Subjective:    Patient ID: Sylvia Davies, female    DOB: 31-Oct-1960, 55 y.o.   MRN: [id number]  HPI  Here for wellness and f/u;  Overall doing ok; Pt states overall good compliance with treatment and medications, good tolerability, and has been trying to follow appropriate diet.  Pt denies worsening depressive symptoms, suicidal ideation or panic. No fever, night sweats, wt loss, loss of appetite, or other constitutional symptoms.  Pt states good ability with ADL's, has low fall risk, home safety reviewed and adequate, no other significant changes in hearing or vision, and only occasionally active with exercise. Has been having right pelvis pain and hip/leg pain, getting outpt PT with [facility] recently.  Did also have recent shingles now resolved, to right side.  Pt denies chest pain, increased sob or doe, wheezing, orthopnea, PND, increased LE swelling, palpitations, dizziness or syncope.  Pt denies new neurological symptoms such as new headache, or facial or extremity weakness or numbness   Pt denies polydipsia, polyuria, or low sugar symptoms such as weakness or confusion improved with po intake.  Pt states overall good compliance with meds, trying to follow lower cholesterol, diabetic diet, wt overall stable but little exercise however.   Hard to lose wt, plans to try to be more active Wt Readings from Last 3 Encounters:  01/15/16 174 lb (78.926 kg)  10/18/15 172 lb (78.019 kg)  06/21/15 173 lb 2 oz (78.529 kg)   Past Medical History  Diagnosis Date  . MITRAL REGURGITATION, 0 (MILD) 04/02/2007  . HYPERTENSION, BENIGN ESSENTIAL 05/04/2007  . HYPERLIPIDEMIA 04/02/2007  . GERD 04/02/2007  . ALLERGIC RHINITIS 04/02/2007  . OBESITY 04/02/2007  . MIGRAINE HEADACHE 04/02/2007  . Irritable bowel syndrome 04/02/2007  . FIBROCYSTIC BREAST DISEASE, HX OF 04/02/2007  . BREAST BIOPSY, HX OF 04/02/2007  . Impaired glucose tolerance 11/08/2012  . Diverticulosis   . Hemorrhoids    Past Surgical  History  Procedure Laterality Date  . Breast biopsy Left   . Leg surgery Left     have rods and srcews    reports that she has never smoked. She has never used smokeless tobacco. She reports that she does not drink alcohol or use illicit drugs. family history includes Diabetes in her maternal grandmother; Hypertension in her mother. Allergies  Allergen Reactions  . Amlodipine Besy-Benazepril Hcl     REACTION: itch and sore throat  . Cleocin [Clindamycin Hcl] Itching  . Hyoscyamine   . Penicillins   . Tramadol   . Zylet [Loteprednol-Tobramycin] Itching   Current Outpatient Prescriptions on File Prior to Visit  Medication Sig Dispense Refill  . amLODipine (NORVASC) 5 MG tablet TAKE 1 TABLET BY MOUTH EVERY DAY 90 tablet 1  . aspirin 81 MG tablet Take 81 mg by mouth daily.      [name] atorvastatin (LIPITOR) 10 MG tablet TAKE 1 TABLET (10 MG TOTAL) BY MOUTH DAILY AT 6 PM. 90 tablet 3  . CAMILA 0.35 MG tablet     . clobetasol ointment (TEMOVATE) 0.05 %     . hydrocortisone (ANUSOL-HC) 25 MG suppository Place 25 mg rectally every 12 (twelve) hours as needed.    . saccharomyces boulardii (FLORASTOR) 250 MG capsule Take 1 capsule (250 mg total) by mouth 2 (two) times daily. 2 capsule 0  . triamcinolone cream (KENALOG) 0.1 % Apply 1 application topically 2 (two) times daily as needed. 30 g 1   No current facility-administered medications on file prior to visit.  Review of Systems  Constitutional: Negative for increased diaphoresis, or other activity, appetite or siginficant weight change other than noted HENT: Negative for worsening hearing loss, ear pain, facial swelling, mouth sores and neck stiffness.   Eyes: Negative for other worsening pain, redness or visual disturbance.  Respiratory: Negative for choking or stridor Cardiovascular: Negative for other chest pain and palpitations.  Gastrointestinal: Negative for worsening diarrhea, blood in stool, or abdominal distention Genitourinary:  Negative for hematuria, flank pain or change in urine volume.  Musculoskeletal: Negative for myalgias or other joint complaints.  Skin: Negative for other color change and wound or drainage.  Neurological: Negative for syncope and numbness. other than noted Hematological: Negative for adenopathy. or other swelling Psychiatric/Behavioral: Negative for hallucinations, SI, self-injury, decreased concentration or other worsening agitation.       Objective:   Physical Exam BP 130/70 mmHg  Pulse 81  Temp(Src) 98.2 F (36.8 C) (Oral)  Resp 20  Wt 174 lb (78.926 kg)  SpO2 98%  LMP 07/31/2013 VS noted,  Constitutional: Pt is oriented to person, place, and time. Appears well-developed and well-nourished, in no significant distress Head: Normocephalic and atraumatic  Eyes: Conjunctivae and EOM are normal. Pupils are equal, round, and reactive to light Right Ear: External ear normal.  Left Ear: External ear normal Nose: Nose normal.  Mouth/Throat: Oropharynx is clear and moist  Neck: Normal range of motion. Neck supple. No JVD present. No tracheal deviation present or significant neck LA or mass Cardiovascular: Normal rate, regular rhythm, normal heart sounds and intact distal pulses.   Pulmonary/Chest: Effort normal and breath sounds without rales or wheezing  Abdominal: Soft. Bowel sounds are normal. NT. No HSM  Musculoskeletal: Normal range of motion. Exhibits no edema Lymphadenopathy: Has no cervical adenopathy.  Neurological: Pt is alert and oriented to person, place, and time. Pt has normal reflexes. No cranial nerve deficit. Motor grossly intact Skin: Skin is warm and dry. No rash noted or new ulcers Psychiatric:  Has normal mood and affect. Behavior is normal.   Lab Results  Component Value Date   WBC 6.7 01/14/2015   HGB 13.6 01/14/2015   HCT 40.9 01/14/2015   PLT 230.0 01/14/2015   GLUCOSE 82 01/14/2015   CHOL 143 01/14/2015   TRIG 67.0 01/14/2015   HDL 44.60 01/14/2015    LDLDIRECT 144.4 10/16/2012   LDLCALC 85 01/14/2015   ALT 10 01/14/2015   AST 14 01/14/2015   NA 139 01/14/2015   K 3.8 01/14/2015   CL 104 01/14/2015   CREATININE 0.79 01/14/2015   BUN 12 01/14/2015   CO2 28 01/14/2015   TSH 1.25 01/14/2015   HGBA1C 5.8 01/14/2015       Assessment & Plan:

## 2016-01-15 NOTE — Assessment & Plan Note (Signed)
stable overall by history and exam, recent data reviewed with pt, and pt to continue medical treatment as before,  to f/u any worsening symptoms or concerns Lab Results  Component Value Date   LDLCALC 85 01/14/2015

## 2016-01-15 NOTE — Assessment & Plan Note (Addendum)
stable overall by history and exam, recent data reviewed with pt, and pt to continue medical treatment as before,  to f/u any worsening symptoms or concerns BP Readings from Last 3 Encounters:  01/15/16 130/70  01/02/16 134/77  10/18/15 156/74

## 2016-01-15 NOTE — Patient Instructions (Signed)

## 2016-01-15 NOTE — Assessment & Plan Note (Addendum)
.  stable overall by history and exam, recent data reviewed with pt, and pt to continue medical treatment as before,  to f/u any worsening symptoms or concerns Lab Results  Component Value Date   HGBA1C 5.8 01/14/2015   In addition to the time spent performing CPE, I spent an additional 10 minutes face to face,in which greater than 50% of this time was spent in counseling and coordination of care for patient's illness as documented.

## 2016-01-15 NOTE — Progress Notes (Signed)
Pre visit review using our clinic review tool, if applicable. No additional management support is needed unless otherwise documented below in the visit note. 

## 2016-01-15 NOTE — Assessment & Plan Note (Signed)

## 2016-01-16 ENCOUNTER — Encounter: Payer: Self-pay | Admitting: Internal Medicine

## 2016-01-16 LAB — HEPATITIS C ANTIBODY: HCV Ab: NEGATIVE

## 2016-01-30 ENCOUNTER — Ambulatory Visit (INDEPENDENT_AMBULATORY_CARE_PROVIDER_SITE_OTHER): Payer: BC Managed Care – PPO | Admitting: Podiatry

## 2016-01-30 ENCOUNTER — Encounter: Payer: Self-pay | Admitting: Podiatry

## 2016-01-30 DIAGNOSIS — M2042 Other hammer toe(s) (acquired), left foot: Secondary | ICD-10-CM

## 2016-01-30 DIAGNOSIS — D169 Benign neoplasm of bone and articular cartilage, unspecified: Secondary | ICD-10-CM | POA: Diagnosis not present

## 2016-01-30 NOTE — Patient Instructions (Signed)
Pre-Operative Instructions  Congratulations, you have decided to take an important step to improving your quality of life.  You can be assured that the doctors of [facility] will be with you every step of the way.  1. Plan to be at the surgery center/hospital at least 1 (one) hour prior to your scheduled time unless otherwise directed by the surgical center/hospital staff.  You must have a responsible adult accompany you, remain during the surgery and drive you home.  Make sure you have directions to the surgical center/hospital and know how to get there on time. 2. For hospital based surgery you will need to obtain a history and physical form from your family physician within 1 month prior to the date of surgery- we will give you a form for you primary physician.  3. We make every effort to accommodate the date you request for surgery.  There are however, times where surgery dates or times have to be moved.  We will contact you as soon as possible if a change in schedule is required.   4. No Aspirin/Ibuprofen for one week before surgery.  If you are on aspirin, any non-steroidal anti-inflammatory medications (Mobic, Aleve, Ibuprofen) you should stop taking it 7 days prior to your surgery.  You make take Tylenol  For pain prior to surgery.  5. Medications- If you are taking daily heart and blood pressure medications, seizure, reflux, allergy, asthma, anxiety, pain or diabetes medications, make sure the surgery center/hospital is aware before the day of surgery so they may notify you which medications to take or avoid the day of surgery. 6. No food or drink after midnight the night before surgery unless directed otherwise by surgical center/hospital staff. 7. No alcoholic beverages 24 hours prior to surgery.  No smoking 24 hours prior to or 24 hours after surgery. 8. Wear loose pants or shorts- loose enough to fit over bandages, boots, and casts. 9. No slip on shoes, sneakers are best. 10. Bring  your boot with you to the surgery center/hospital.  Also bring crutches or a walker if your physician has prescribed it for you.  If you do not have this equipment, it will be provided for you after surgery. 11. If you have not been contracted by the surgery center/hospital by the day before your surgery, call to confirm the date and time of your surgery. 12. Leave-time from work may vary depending on the type of surgery you have.  Appropriate arrangements should be made prior to surgery with your employer. 13. Prescriptions will be provided immediately following surgery by your doctor.  Have these filled as soon as possible after surgery and take the medication as directed. 14. Remove nail polish on the operative foot. 15. Wash the night before surgery.  The night before surgery wash the foot and leg well with the antibacterial soap provided and water paying special attention to beneath the toenails and in between the toes.  Rinse thoroughly with water and dry well with a towel.  Perform this wash unless told not to do so by your physician.  Enclosed: 1 Ice pack (please put in freezer the night before surgery)   1 Hibiclens skin cleaner   Pre-op Instructions  If you have any questions regarding the instructions, do not hesitate to call our office.  [place]: [address] [place], [place] [address] [phone]  [place]: [address] [place], [place] [address] [phone]  [place]: [address]  [place], [place] [address] [phone]   Dr.   Norman Regal DPM, Dr. Matthew Wagoner DPM, Dr. M. Todd Hyatt DPM, Dr. Titorya Stover DPM 

## 2016-02-04 NOTE — Progress Notes (Signed)
Subjective:     Patient ID: Sylvia Davies, female   DOB: [birth year], 55 y.o.   MRN: [id number]  HPI patient presents with chronic lesion distal lateral aspect digit 5 left and rotated fifth toe left it's very tender and makes wearing shoe gear difficult   Review of Systems     Objective:   Physical Exam Neurovascular status intact muscle strength adequate with distal rotation digit 5 left with distal lateral keratotic lesion that's very painful when pressed and makes shoe gear difficult    Assessment:     Distal hammertoe fifth digit left with pain and distal lateral exostotic lesion chronic in nature with failure to respond to padding and trimming    Plan:     H&P x-rays reviewed condition discussed. Due to long-standing nature and severe rotation of the toe derotational arthroplasty along with distal lateral exostectomy recommended. Patient wants surgery understanding risk and that we will not be able to derotate the toe completely and I did allow her to read consent form reviewing alternative treatments complications. Patient wants surgery signed consent form after extensive review and understands total recovery can take 6 months to one year. Patient is scheduled for outpatient surgery at this time

## 2016-02-06 ENCOUNTER — Ambulatory Visit: Payer: BC Managed Care – PPO | Admitting: Podiatry

## 2016-02-10 ENCOUNTER — Encounter: Payer: Self-pay | Admitting: Podiatry

## 2016-02-10 DIAGNOSIS — M25774 Osteophyte, right foot: Secondary | ICD-10-CM | POA: Diagnosis not present

## 2016-02-10 DIAGNOSIS — M2041 Other hammer toe(s) (acquired), right foot: Secondary | ICD-10-CM | POA: Diagnosis not present

## 2016-02-12 ENCOUNTER — Telehealth: Payer: Self-pay | Admitting: *Deleted

## 2016-02-12 NOTE — Telephone Encounter (Addendum)
Post op courtesy call-Left message encouraging pt to read Post Op Instruction sheet.  I left message informing pt to remain in the boot at al times, not to weight bear, dangle, or have the foot below her heart more than 15 mins/hour, remain in the original dressing and keep clean and dry it will be changed at 1st POV, call with concerns. Pt called back left name, phone.  I spoke with pt and she said she got my message and is resting and staying off the foot. Pt states she may needed to change the 1st POV, because she doesn't get paid until 02/24/2016.  I told pt I didn't think that would matter, but to check with the schedulers to see.

## 2016-02-20 ENCOUNTER — Ambulatory Visit (INDEPENDENT_AMBULATORY_CARE_PROVIDER_SITE_OTHER): Payer: BC Managed Care – PPO | Admitting: Podiatry

## 2016-02-20 ENCOUNTER — Ambulatory Visit (INDEPENDENT_AMBULATORY_CARE_PROVIDER_SITE_OTHER): Payer: BC Managed Care – PPO

## 2016-02-20 VITALS — Temp 96.6°F

## 2016-02-20 DIAGNOSIS — M2042 Other hammer toe(s) (acquired), left foot: Secondary | ICD-10-CM

## 2016-02-20 DIAGNOSIS — Z9889 Other specified postprocedural states: Secondary | ICD-10-CM

## 2016-02-20 DIAGNOSIS — D169 Benign neoplasm of bone and articular cartilage, unspecified: Secondary | ICD-10-CM | POA: Diagnosis not present

## 2016-02-22 NOTE — Progress Notes (Signed)
Subjective:     Patient ID: Sylvia Davies, female   DOB: June 25, 1961, 55 y.o.   MRN: [id number]  HPI patient states she's doing great with her left foot   Review of Systems     Objective:   Physical Exam Neurovascular status intact with patient found to have well aligned fifth digit left in excellent position with distal lateral exostectomy was performed with sutures well coapted and no pathology noted    Assessment:     Doing well post arthroplasty exostectomy fifth left    Plan:     Reapplied sterile dressing reviewed x-rays and patient will return 2 weeks for suture removal or earlier if needed  X-ray report indicated good alignment of the digit and the lateral side with no pathology

## 2016-02-27 ENCOUNTER — Other Ambulatory Visit: Payer: Self-pay

## 2016-03-05 ENCOUNTER — Ambulatory Visit (INDEPENDENT_AMBULATORY_CARE_PROVIDER_SITE_OTHER): Payer: BC Managed Care – PPO

## 2016-03-05 ENCOUNTER — Ambulatory Visit (INDEPENDENT_AMBULATORY_CARE_PROVIDER_SITE_OTHER): Payer: BC Managed Care – PPO | Admitting: Podiatry

## 2016-03-05 DIAGNOSIS — D169 Benign neoplasm of bone and articular cartilage, unspecified: Secondary | ICD-10-CM | POA: Diagnosis not present

## 2016-03-05 DIAGNOSIS — Z9889 Other specified postprocedural states: Secondary | ICD-10-CM | POA: Diagnosis not present

## 2016-03-05 DIAGNOSIS — M2042 Other hammer toe(s) (acquired), left foot: Secondary | ICD-10-CM

## 2016-03-05 NOTE — Progress Notes (Signed)
Subjective:     Patient ID: Sylvia Davies, female   DOB: [birth year], 55 y.o.   MRN: [id number]  HPI patient states she's doing very well with the left foot and is having minimal discomfort with stitches intact and minimal disc pain on the side of the toe   Review of Systems     Objective:   Physical Exam Neurovascular status intact muscle strength was found to be within normal limits and patient was noted to have excellent alignment fifth digit with stitches intact wound edges well coapted    Assessment:     Doing well post surgery left fifth digit and left exostectomy    Plan:     Stitches removed x-ray reviewed and allow patient to gradually return soft shoe gear and reappoint 4 weeks to reevaluate or earlier if needed  X-ray report was negative for signs of fracture indicated good alignment with no pathology

## 2016-03-11 NOTE — Progress Notes (Signed)
DOS 02/10/2016 hammertoe repair w/removal bone 5th left, exostectomy 5th left

## 2016-03-12 ENCOUNTER — Other Ambulatory Visit: Payer: Self-pay | Admitting: Internal Medicine

## 2016-04-01 ENCOUNTER — Other Ambulatory Visit: Payer: BC Managed Care – PPO

## 2016-04-13 ENCOUNTER — Other Ambulatory Visit: Payer: BC Managed Care – PPO

## 2016-04-15 ENCOUNTER — Ambulatory Visit (INDEPENDENT_AMBULATORY_CARE_PROVIDER_SITE_OTHER): Payer: BC Managed Care – PPO | Admitting: Podiatry

## 2016-04-15 ENCOUNTER — Encounter: Payer: Self-pay | Admitting: Podiatry

## 2016-04-15 DIAGNOSIS — L84 Corns and callosities: Secondary | ICD-10-CM

## 2016-04-15 DIAGNOSIS — Z9889 Other specified postprocedural states: Secondary | ICD-10-CM

## 2016-04-16 NOTE — Progress Notes (Signed)
Subjective:     Patient ID: Sylvia Davies, female   DOB: 12-16-1960, 55 y.o.   MRN: [id number]  HPI patient states she's doing very well with her surgery with minimal discomfort   Review of Systems     Objective:   Physical Exam Neurovascular status intact with good alignment noted with structural correction that's satisfactory    Assessment:     Doing well post correction    Plan:     Advised on anti-inflammatories physical therapy and return to shoe gear. X-rays look good  X-ray show good alignment with no indications of pathology at this time

## 2016-05-21 ENCOUNTER — Other Ambulatory Visit (HOSPITAL_COMMUNITY): Payer: Self-pay

## 2016-05-24 ENCOUNTER — Encounter (HOSPITAL_COMMUNITY)
Admission: RE | Admit: 2016-05-24 | Discharge: 2016-05-24 | Disposition: A | Discharge status: Home / Self Care | Payer: BC Managed Care – PPO | Source: Ambulatory Visit | Attending: Obstetrics and Gynecology | Admitting: Obstetrics and Gynecology

## 2016-05-24 ENCOUNTER — Other Ambulatory Visit: Payer: Self-pay

## 2016-05-24 ENCOUNTER — Encounter (HOSPITAL_COMMUNITY): Payer: Self-pay

## 2016-05-24 DIAGNOSIS — Z01812 Encounter for preprocedural laboratory examination: Secondary | ICD-10-CM | POA: Insufficient documentation

## 2016-05-24 DIAGNOSIS — Z01810 Encounter for preprocedural cardiovascular examination: Secondary | ICD-10-CM | POA: Insufficient documentation

## 2016-05-24 LAB — BASIC METABOLIC PANEL
ANION GAP: 5 (ref 5–15)
BUN: 10 mg/dL (ref 6–20)
CALCIUM: 9.6 mg/dL (ref 8.9–10.3)
CHLORIDE: 104 mmol/L (ref 101–111)
CO2: 31 mmol/L (ref 22–32)
Creatinine, Ser: 0.72 mg/dL (ref 0.44–1.00)
GFR calc non Af Amer: >60 mL/min (ref >60)
GLUCOSE: 73 mg/dL (ref 65–99)
Potassium: 3.8 mmol/L (ref 3.5–5.1)
Sodium: 140 mmol/L (ref 135–145)

## 2016-05-24 LAB — CBC
HCT: 40.1 % (ref 36.0–46.0)
HEMOGLOBIN: 13.5 g/dL (ref 12.0–15.0)
MCH: 27.7 pg (ref 26.0–34.0)
MCHC: 33.7 g/dL (ref 30.0–36.0)
MCV: 82.2 fL (ref 78.0–100.0)
Platelets: 230 10*3/uL (ref 150–400)
RBC: 4.88 MIL/uL (ref 3.87–5.11)
RDW: 14.8 % (ref 11.5–15.5)
WBC: 6.3 10*3/uL (ref 4.0–10.5)

## 2016-05-24 NOTE — Patient Instructions (Addendum)
Your procedure is scheduled on:  Wednesday, 11/22  Enter through the Main Entrance of [facility] at: 6 am  Pick up the phone at the desk and dial [phone].  Call this number if you have problems the morning of surgery: [phone].  Remember: Do NOT eat or drink (including water) after midnight Tuesday, 11/21  Take these medicines the morning of surgery with a SIP OF WATER: amlodipine  Do NOT wear jewelry (body piercing), metal hair clips/bobby pins, make-up, or nail polish.  Do NOT wear lotions, powders, or perfumes.  You may wear deoderant.  Do NOT shave for 48 hours prior to surgery.  Do NOT bring valuables to the hospital.   Have a responsible adult drive you home and stay with you for 24 hours after your procedure. Home with daughter Gwyndolyn Kaufman cell [phone].

## 2016-06-01 NOTE — Anesthesia Preprocedure Evaluation (Addendum)
Anesthesia Evaluation  Patient identified by MRN, date of birth, ID band Patient awake    Reviewed: Allergy & Precautions, NPO status , Patient's Chart, lab work & pertinent test results  History of Anesthesia Complications Negative for: history of anesthetic complications  Airway Mallampati: II  TM Distance: >3 FB Neck ROM: Full    Dental no notable dental hx. (+) Dental Advisory Given   Pulmonary neg pulmonary ROS,    Pulmonary exam normal        Cardiovascular hypertension, Pt. on medications Normal cardiovascular exam     Neuro/Psych  Headaches, negative psych ROS   GI/Hepatic Neg liver ROS, GERD  ,  Endo/Other  negative endocrine ROS  Renal/GU negative Renal ROS     Musculoskeletal   Abdominal   Peds  Hematology   Anesthesia Other Findings   Reproductive/Obstetrics                            Anesthesia Physical Anesthesia Plan  ASA: II  Anesthesia Plan: General   Post-op Pain Management:    Induction: Intravenous  Airway Management Planned: LMA  Additional Equipment:   Intra-op Plan:   Post-operative Plan: Extubation in OR  Informed Consent:   Dental advisory given  Plan Discussed with: CRNA  Anesthesia Plan Comments:        Anesthesia Quick Evaluation

## 2016-06-02 ENCOUNTER — Ambulatory Visit (HOSPITAL_COMMUNITY)
Admission: RE | Admit: 2016-06-02 | Discharge: 2016-06-02 | Disposition: A | Discharge status: Home / Self Care | Payer: BC Managed Care – PPO | Source: Ambulatory Visit | Attending: Obstetrics and Gynecology | Admitting: Obstetrics and Gynecology

## 2016-06-02 ENCOUNTER — Encounter (HOSPITAL_COMMUNITY): Payer: Self-pay

## 2016-06-02 ENCOUNTER — Ambulatory Visit (HOSPITAL_COMMUNITY): Payer: BC Managed Care – PPO | Admitting: Anesthesiology

## 2016-06-02 ENCOUNTER — Encounter (HOSPITAL_COMMUNITY)
Admission: RE | Disposition: A | Discharge status: Home / Self Care | Payer: Self-pay | Source: Ambulatory Visit | Attending: Obstetrics and Gynecology

## 2016-06-02 DIAGNOSIS — N840 Polyp of corpus uteri: Secondary | ICD-10-CM | POA: Insufficient documentation

## 2016-06-02 DIAGNOSIS — E7800 Pure hypercholesterolemia, unspecified: Secondary | ICD-10-CM | POA: Diagnosis not present

## 2016-06-02 DIAGNOSIS — Z888 Allergy status to other drugs, medicaments and biological substances status: Secondary | ICD-10-CM | POA: Diagnosis not present

## 2016-06-02 DIAGNOSIS — Z880 Allergy status to penicillin: Secondary | ICD-10-CM | POA: Diagnosis not present

## 2016-06-02 DIAGNOSIS — Z7982 Long term (current) use of aspirin: Secondary | ICD-10-CM | POA: Insufficient documentation

## 2016-06-02 DIAGNOSIS — Z8249 Family history of ischemic heart disease and other diseases of the circulatory system: Secondary | ICD-10-CM | POA: Diagnosis not present

## 2016-06-02 DIAGNOSIS — K219 Gastro-esophageal reflux disease without esophagitis: Secondary | ICD-10-CM | POA: Insufficient documentation

## 2016-06-02 DIAGNOSIS — Z79899 Other long term (current) drug therapy: Secondary | ICD-10-CM | POA: Insufficient documentation

## 2016-06-02 DIAGNOSIS — I10 Essential (primary) hypertension: Secondary | ICD-10-CM | POA: Diagnosis not present

## 2016-06-02 DIAGNOSIS — N950 Postmenopausal bleeding: Secondary | ICD-10-CM | POA: Diagnosis present

## 2016-06-02 DIAGNOSIS — Z8619 Personal history of other infectious and parasitic diseases: Secondary | ICD-10-CM | POA: Insufficient documentation

## 2016-06-02 DIAGNOSIS — Z833 Family history of diabetes mellitus: Secondary | ICD-10-CM | POA: Diagnosis not present

## 2016-06-02 HISTORY — PX: DILATATION & CURETTAGE/HYSTEROSCOPY WITH MYOSURE: SHX6511

## 2016-06-02 SURGERY — DILATATION & CURETTAGE/HYSTEROSCOPY WITH MYOSURE
Anesthesia: General | Site: Vagina

## 2016-06-02 MED ORDER — MIDAZOLAM HCL 2 MG/2ML IJ SOLN
INTRAMUSCULAR | Status: AC
  Filled 2016-06-02: qty 2

## 2016-06-02 MED ORDER — ONDANSETRON HCL 4 MG/2ML IJ SOLN
INTRAMUSCULAR | Status: AC
  Filled 2016-06-02: qty 2

## 2016-06-02 MED ORDER — IBUPROFEN 600 MG PO TABS: 600.0000 mg | ORAL_TABLET | Freq: Four times a day (QID) | ORAL | 0 refills | Status: DC | PRN

## 2016-06-02 MED ORDER — FENTANYL CITRATE (PF) 100 MCG/2ML IJ SOLN
INTRAMUSCULAR | Status: AC
  Filled 2016-06-02: qty 2

## 2016-06-02 MED ORDER — PROMETHAZINE HCL 25 MG/ML IJ SOLN: 6.2500 mg | INTRAMUSCULAR | Status: DC | PRN

## 2016-06-02 MED ORDER — PROPOFOL 10 MG/ML IV BOLUS
INTRAVENOUS | Status: DC | PRN
  Administered 2016-06-02: 150 mg via INTRAVENOUS

## 2016-06-02 MED ORDER — PROPOFOL 10 MG/ML IV BOLUS
INTRAVENOUS | Status: AC
  Filled 2016-06-02: qty 20

## 2016-06-02 MED ORDER — LIDOCAINE HCL 2 % IJ SOLN
INTRAMUSCULAR | Status: DC | PRN
  Administered 2016-06-02: 10 mL

## 2016-06-02 MED ORDER — SODIUM CHLORIDE 0.9 % IR SOLN
Status: DC | PRN
  Administered 2016-06-02: 3000 mL

## 2016-06-02 MED ORDER — ONDANSETRON HCL 4 MG/2ML IJ SOLN
INTRAMUSCULAR | Status: DC | PRN
  Administered 2016-06-02: 4 mg via INTRAVENOUS

## 2016-06-02 MED ORDER — DEXAMETHASONE SODIUM PHOSPHATE 10 MG/ML IJ SOLN
INTRAMUSCULAR | Status: DC | PRN
  Administered 2016-06-02: 4 mg via INTRAVENOUS

## 2016-06-02 MED ORDER — FENTANYL CITRATE (PF) 100 MCG/2ML IJ SOLN
INTRAMUSCULAR | Status: DC | PRN
  Administered 2016-06-02: 25 ug via INTRAVENOUS

## 2016-06-02 MED ORDER — LACTATED RINGERS IV SOLN
INTRAVENOUS | Status: DC
  Administered 2016-06-02: 06:00:00 via INTRAVENOUS

## 2016-06-02 MED ORDER — LIDOCAINE HCL 2 % IJ SOLN
INTRAMUSCULAR | Status: AC
  Filled 2016-06-02: qty 20

## 2016-06-02 MED ORDER — LIDOCAINE HCL (CARDIAC) 20 MG/ML IV SOLN
INTRAVENOUS | Status: DC | PRN
  Administered 2016-06-02: 100 mg via INTRAVENOUS

## 2016-06-02 MED ORDER — MIDAZOLAM HCL 2 MG/2ML IJ SOLN
INTRAMUSCULAR | Status: DC | PRN
  Administered 2016-06-02 (×2): 1 mg via INTRAVENOUS

## 2016-06-02 MED ORDER — EPHEDRINE SULFATE 50 MG/ML IJ SOLN
INTRAMUSCULAR | Status: DC | PRN
  Administered 2016-06-02: 5 mg via INTRAVENOUS

## 2016-06-02 MED ORDER — LIDOCAINE HCL (CARDIAC) 20 MG/ML IV SOLN
INTRAVENOUS | Status: AC
  Filled 2016-06-02: qty 5

## 2016-06-02 MED ORDER — DEXAMETHASONE SODIUM PHOSPHATE 4 MG/ML IJ SOLN
INTRAMUSCULAR | Status: AC
  Filled 2016-06-02: qty 1

## 2016-06-02 MED ORDER — HYDROMORPHONE HCL 1 MG/ML IJ SOLN: 0.2500 mg | INTRAMUSCULAR | Status: DC | PRN

## 2016-06-02 SURGICAL SUPPLY — 19 items
CANISTER SUCT 3000ML (MISCELLANEOUS) ×5 IMPLANT
CATH ROBINSON RED A/P 16FR (CATHETERS) ×3 IMPLANT
CLOTH BEACON ORANGE TIMEOUT ST (SAFETY) ×3 IMPLANT
CONTAINER PREFILL 10% NBF 60ML (FORM) ×4 IMPLANT
DEVICE MYOSURE LITE (MISCELLANEOUS) ×2 IMPLANT
DEVICE MYOSURE REACH (MISCELLANEOUS) IMPLANT
DILATOR CANAL MILEX (MISCELLANEOUS) ×2 IMPLANT
FILTER ARTHROSCOPY CONVERTOR (FILTER) ×3 IMPLANT
GLOVE BIO SURGEON STRL SZ7 (GLOVE) ×3 IMPLANT
GLOVE BIOGEL PI IND STRL 7.0 (GLOVE) ×2 IMPLANT
GLOVE BIOGEL PI INDICATOR 7.0 (GLOVE) ×4
GOWN STRL REUS W/TWL LRG LVL3 (GOWN DISPOSABLE) ×6 IMPLANT
PACK VAGINAL MINOR WOMEN LF (CUSTOM PROCEDURE TRAY) ×3 IMPLANT
PAD OB MATERNITY 4.3X12.25 (PERSONAL CARE ITEMS) ×3 IMPLANT
SEAL ROD LENS SCOPE MYOSURE (ABLATOR) ×3 IMPLANT
TOWEL OR 17X24 6PK STRL BLUE (TOWEL DISPOSABLE) ×6 IMPLANT
TUBING AQUILEX INFLOW (TUBING) ×3 IMPLANT
TUBING AQUILEX OUTFLOW (TUBING) ×3 IMPLANT
WATER STERILE IRR 1000ML POUR (IV SOLUTION) ×3 IMPLANT

## 2016-06-02 NOTE — Interval H&P Note (Signed)
History and Physical Interval Note:  06/02/2016 7:15 AM  Sylvia Davies  has presented today for surgery, with the diagnosis of N94.89 Endometrial Mass  The various methods of treatment have been discussed with the patient and family. After consideration of risks, benefits and other options for treatment, the patient has consented to  Procedure(s) with comments: [place] (N/A) - Myosure - Endometrial Mass as a surgical intervention .  The patient's history has been reviewed, patient examined, no change in status, stable for surgery.  I have reviewed the patient's chart and labs.  Questions were answered to the patient's satisfaction.     Simona Huh, Daelen Belvedere

## 2016-06-02 NOTE — Discharge Instructions (Signed)

## 2016-06-02 NOTE — Anesthesia Procedure Notes (Signed)
Procedure Name: LMA Insertion Date/Time: 06/02/2016 7:23 AM Performed by: Hewitt Blade Pre-anesthesia Checklist: Patient identified, Emergency Drugs available, Suction available and Patient being monitored Patient Re-evaluated:Patient Re-evaluated prior to inductionOxygen Delivery Method: Circle system utilized Preoxygenation: Pre-oxygenation with 100% oxygen Intubation Type: IV induction LMA Size: 4.0 Number of attempts: 1 Placement Confirmation: positive ETCO2 and breath sounds checked- equal and bilateral Tube secured with: Tape Dental Injury: Teeth and Oropharynx as per pre-operative assessment

## 2016-06-02 NOTE — Brief Op Note (Signed)
06/02/2016  8:05 AM  PATIENT:  Sylvia Davies  55 y.o. female  PRE-OPERATIVE DIAGNOSIS:  N94.89 Endometrial Mass, Postmenopausal bleeding  POST-OPERATIVE DIAGNOSIS:  Same  PROCEDURE:  Procedure(s) with comments: DILATATION & CURETTAGE/HYSTEROSCOPY WITH MYOSURE (N/A) - Myosure - Endometrial Mass  SURGEON:  Surgeon(s) and Role:    * Thurnell Lose, MD - Primary  PHYSICIAN ASSISTANT:   ASSISTANTS: none   ANESTHESIA:   local and general  EBL:  Total I/O In: -  Out: 10 [Blood:10]  BLOOD ADMINISTERED:none  DRAINS: none   LOCAL MEDICATIONS USED:  LIDOCAINE  and Amount: 10 ml  SPECIMEN:  Source of Specimen:  Endometrial polyps, endometrial currettings  DISPOSITION OF SPECIMEN:  PATHOLOGY  COUNTS:  YES  TOURNIQUET:  * No tourniquets in log *  DICTATION: .Other Dictation: Dictation Number Not reported  PLAN OF CARE: Discharge to home after PACU  PATIENT DISPOSITION:  PACU - hemodynamically stable.   Delay start of Pharmacological VTE agent (>24hrs) due to surgical blood loss or risk of bleeding: not applicable

## 2016-06-02 NOTE — H&P (Signed)
History of Present Illness  General:  55 y/o presents for preop for Hysteroscopy/D&C, Myosure for resection of endometrial mass, which is likely a polyp. Pt had an episode of postmenopausal bleeding~ 5 months ago. Mass was noted on SHG. Pt denies bleeding since the original episode. She is currently taking progesterone daily.   Current Medications  Taking   Vitamin D (Cholecalciferol) 400 UNIT Tablet Chewable 1 tablet Orally Once a day   Atorvastatin Calcium 10 MG Tablet 1 tablet Orally Once a day   Clobetasol Propionate 0.05 % Ointment 1 application to affected area Externally Twice a day   Camila(Norethindrone) 0.35 MG Tablet   Amlodipine Besylate 5 MG Tablet 1 tablet Orally Once a day   Vitamin E 600 UNIT Capsule 1 capsule Orally Once a day   Not-Taking/PRN   Bayer Aspirin EC Low Dose(Aspirin) 81 MG Tablet Delayed Release 1 tablet Orally Once a day   Diflucan(Fluconazole) 150 MG Tablet 1 tablet Orally Once a day   Medication List reviewed and reconciled with the patient    Past Medical History  Hypertension.   Irregular periods.   Hypercholesterolemia.   Shingles 10/2015.           Surgical History  ankle/ leg surgery- left(pins & screws placed) 2000  D/C   surgery on toe on left foot 02/10/16   Family History  Father: deceased  Mother: deceased, hypertension  Paternal [place] Father: deceased  Paternal Grand Mother: deceased  Maternal Grand Father: deceased  Maternal Grand Mother: deceased, diabetes  Daughter(s): alive, 2 daughters  2daughter(s) .   Denies any GYN cancer family hx.   Social History  General:  Tobacco use  cigarettes: Never smoked Tobacco history last updated 05/28/2016 no EXPOSURE TO PASSIVE SMOKE.  no Alcohol.  no Recreational drug use.  Exercise: 3 times weekly.  Marital Status: married.  Children: 2, daughter (s).  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  no Domestic Violence.    Gyn History  Sexual activity  currently sexually active.  Periods : postmenopausal, has had irregular bleeding.  LMP 2014.  Birth control Camila.  Last pap smear date 12/25/15, neg pap, endometrial cells present.  Last mammogram date 5.10.17.  Abnormal pap smear yes - hx.  Denies H/O STD.  GYN procedures Endometrial Biopsy, 11/27/15.    OB History  Number of pregnancies 5.  miscarriages 3.  Pregnancy # 1 live birth, vaginal delivery, girl.  Pregnancy # 2 live birth, vaginal delivery, girl.    Allergies  Penicillin (for allergy): itching, throat closes  Benazepril HCl   Hospitalization/Major Diagnostic Procedure  ankle/leg surgery 2000  childbirth x 2 vaginal [id number]   Review of Systems  Denies fever/chills, chest pain, SOB, headaches, numbness/tingling. No h/o complication with anesthesia, bleeding disorders or blood clots.   Vital Signs  Wt 178, Ht 62.25, BMI 32.29, Pulse sitting 77, BP sitting 129/77.   Physical Examination  GENERAL:  Patient appears alert and oriented.  General Appearance: well-appearing, well-developed, no acute distress.  Speech: clear.  LUNGS:  Auscultation: no wheezing/rhonchi/rales. CTA bilaterally.  HEART:  Heart sounds: normal. RRR. no murmur.  ABDOMEN:  General: soft nontender, nondistended, no masses.  FEMALE GENITOURINARY:  Pelvic vulva normal, Cervix normal, vaginal discharge appears normal, no blood in vault.  EXTREMITIES:  General: No edema or calf tenderness.     Assessments   1. Pre-operative clearance - Z01.818 (Primary)   2. Postmenopausal bleeding - N95.0   3. Endometrial mass - N94.89   Treatment  1. Pre-operative clearance  Notes: R/B/A of procedure reviewed, including but not limited to uterine perforation, infection, bleeding.    Follow Up  2 Weeks post op

## 2016-06-02 NOTE — Interval H&P Note (Deleted)
History and Physical Interval Note:  06/02/2016 7:15 AM  Sylvia Davies  has presented today for surgery, with the diagnosis of N94.89 Endometrial Mass  The various methods of treatment have been discussed with the patient and family. After consideration of risks, benefits and other options for treatment, the patient has consented to  Procedure(s) with comments: [place] (N/A) - Myosure - Endometrial Mass as a surgical intervention .  The patient's history has been reviewed, patient examined, no change in status, stable for surgery.  I have reviewed the patient's chart and labs.  Questions were answered to the patient's satisfaction.     Simona Huh, Zia Kanner

## 2016-06-02 NOTE — Anesthesia Postprocedure Evaluation (Signed)
Anesthesia Post Note  Patient: Sylvia Davies  Procedure(s) Performed: Procedure(s) (LRB): DILATATION & CURETTAGE/HYSTEROSCOPY WITH MYOSURE (N/A)  Patient location during evaluation: PACU Anesthesia Type: General Level of consciousness: sedated Pain management: pain level controlled Vital Signs Assessment: post-procedure vital signs reviewed and stable Respiratory status: spontaneous breathing and respiratory function stable Cardiovascular status: stable Anesthetic complications: no     Last Vitals:  Vitals:   06/02/16 0805 06/02/16 0815  BP: (!) 147/87 (!) 153/86  Pulse: 86 77  Resp: 16 16  Temp: 36.4 C     Last Pain:  Vitals:   06/02/16 0604  TempSrc: Oral   Pain Goal: Patients Stated Pain Goal: 3 (06/02/16 0604)               Duane Boston DANIEL

## 2016-06-02 NOTE — Transfer of Care (Signed)
Immediate Anesthesia Transfer of Care Note  Patient: Sylvia Davies  Procedure(s) Performed: Procedure(s) with comments: DILATATION & CURETTAGE/HYSTEROSCOPY WITH MYOSURE (N/A) - Myosure - Endometrial Mass  Patient Location: PACU  Anesthesia Type:General  Level of Consciousness: awake, alert  and oriented  Airway & Oxygen Therapy: Patient Spontanous Breathing and Patient connected to nasal cannula oxygen  Post-op Assessment: Report given to RN, Post -op Vital signs reviewed and stable and Patient moving all extremities  Post vital signs: Reviewed  Last Vitals:  Vitals:   06/02/16 0604  BP: 134/82  Pulse: 66  Resp: 16  Temp: 36.6 C    Last Pain:  Vitals:   06/02/16 0604  TempSrc: Oral      Patients Stated Pain Goal: 3 (0000000 [id number])  Complications: No apparent anesthesia complications

## 2016-06-02 NOTE — Op Note (Signed)
NAMEMargert, Sylvia Davies Datha              ACCOUNT NO.:  [id number]  MEDICAL RECORD NO.:  [id number]  LOCATION:                                 FACILITY:  PHYSICIAN:  Jola Schmidt, MD   DATE OF BIRTH:  Nov 14, 1960  DATE OF PROCEDURE:  06/02/2016 DATE OF DISCHARGE:                              OPERATIVE REPORT   PREOPERATIVE DIAGNOSES:  Endometrial mass, postmenopausal bleeding.  POSTOPERATIVE DIAGNOSES:  Endometrial mass, postmenopausal bleeding.  PROCEDURE:  Hysteroscopy, D and C with MyoSure with resection of 2 endometrial polyps.  SURGEON:  Jola Schmidt, MD.  ASSISTANT:  Technician.  ANESTHESIA:  Local and general.  ESTIMATED BLOOD LOSS:  10.  BLOOD ADMINISTERED:  None.  DRAINS:  None.  Local lidocaine, 10 mL.  SPECIMEN:  Endometrial polyps and endometrial curettings to Pathology.  COUNTS:  Correct.  PATIENT DISPOSITION:  To PACU, hemodynamically stable.  COMPLICATIONS:  None.  DEFICIT:  1200, please see details of procedure.  FINDINGS:  Normal endometrial cavity with 2 polyps, 1 polyp extending into the lower one-third of the endocervical canal.  There was a punctate hemorrhage at the base of one of the polyps.  Cervix normal.  PROCEDURE IN DETAIL:  [name] was identified in the holding area.  She was then taken to the operating room.  She was placed in the dorsal lithotomy position and underwent general endotracheal anesthesia without complication.  She was then prepped and draped in a normal sterile fashion.  She was not given any antibiotics prior to the procedure.  A time-out was performed.  SCDs were on her legs operating prior to the procedure.  Graves speculum was inserted into the vagina.  Paracervical block was performed with lidocaine.  Single-tooth tenaculum was applied to the anterior lip of the cervix.  Cervix was then dilated, first using os finder and then she was dilated up to a 6.  The hysteroscope was then advanced to the lower uterine  segment and the polyp extended into the cervix, but I was able to get into the uterus.  The MyoSure was then assembled and advanced through the hysteroscope and the polyps were easily resected.  Hysteroscope and MyoSure were removed.  Did a sharp curettage of the uterus.  Uterus was intact.  Endometrial curettings were collected on Telfa and sent to Pathology.  The deficit read as 1200 and it climbed even after being reset.  There was no sign of a perforation.  The uterus was well distended and I have no suspicion of a perforation at the time for the cervical dilation. When the device was primed, it alerted before the water came through the hysteroscope which was atypical.  The water did eventually flow through the hysteroscope, but it might have been improperly primed, which might have caused the false reading; however, the case was done quickly and the patient did not receive excessive volume.  All instrument and sponge counts were correct x3.  The patient was taken to the operating room in stable condition.     Jola Schmidt, MD   ______________________________ Jola Schmidt, MD    EBV/MEDQ  D:  06/02/2016  T:  06/02/2016  Job:  [id number] 

## 2016-06-07 ENCOUNTER — Encounter (HOSPITAL_COMMUNITY): Payer: Self-pay | Admitting: Obstetrics and Gynecology

## 2016-06-09 ENCOUNTER — Other Ambulatory Visit: Payer: Self-pay | Admitting: Internal Medicine

## 2016-10-07 ENCOUNTER — Other Ambulatory Visit: Payer: Self-pay | Admitting: Nurse Practitioner

## 2016-10-07 DIAGNOSIS — Z1231 Encounter for screening mammogram for malignant neoplasm of breast: Secondary | ICD-10-CM

## 2016-11-19 ENCOUNTER — Ambulatory Visit
Admission: RE | Admit: 2016-11-19 | Discharge: 2016-11-19 | Disposition: A | Discharge status: Home / Self Care | Payer: BC Managed Care – PPO | Source: Ambulatory Visit | Attending: Nurse Practitioner | Admitting: Nurse Practitioner

## 2016-11-19 DIAGNOSIS — Z1231 Encounter for screening mammogram for malignant neoplasm of breast: Secondary | ICD-10-CM

## 2016-12-01 ENCOUNTER — Other Ambulatory Visit: Payer: Self-pay | Admitting: Internal Medicine

## 2017-01-17 ENCOUNTER — Other Ambulatory Visit (INDEPENDENT_AMBULATORY_CARE_PROVIDER_SITE_OTHER): Payer: BC Managed Care – PPO

## 2017-01-17 DIAGNOSIS — Z0001 Encounter for general adult medical examination with abnormal findings: Secondary | ICD-10-CM | POA: Diagnosis not present

## 2017-01-17 DIAGNOSIS — R7302 Impaired glucose tolerance (oral): Secondary | ICD-10-CM | POA: Diagnosis not present

## 2017-01-17 LAB — HEPATIC FUNCTION PANEL
ALBUMIN: 4.1 g/dL (ref 3.5–5.2)
ALK PHOS: 105 U/L (ref 39–117)
ALT: 13 U/L (ref 0–35)
AST: 16 U/L (ref 0–37)
Bilirubin, Direct: 0.1 mg/dL (ref 0.0–0.3)
TOTAL PROTEIN: 7.7 g/dL (ref 6.0–8.3)
Total Bilirubin: 0.3 mg/dL (ref 0.2–1.2)

## 2017-01-17 LAB — BASIC METABOLIC PANEL
BUN: 12 mg/dL (ref 6–23)
CALCIUM: 10.1 mg/dL (ref 8.4–10.5)
CO2: 29 meq/L (ref 19–32)
Chloride: 101 mEq/L (ref 96–112)
Creatinine, Ser: 0.77 mg/dL (ref 0.40–1.20)
GFR: 99.17 mL/min (ref >60.00)
GLUCOSE: 84 mg/dL (ref 70–99)
Potassium: 3.8 mEq/L (ref 3.5–5.1)
SODIUM: 137 meq/L (ref 135–145)

## 2017-01-17 LAB — URINALYSIS, ROUTINE W REFLEX MICROSCOPIC
Bilirubin Urine: NEGATIVE
KETONES UR: NEGATIVE
LEUKOCYTES UA: NEGATIVE
Nitrite: NEGATIVE
PH: 7 (ref 5.0–8.0)
TOTAL PROTEIN, URINE-UPE24: NEGATIVE
URINE GLUCOSE: NEGATIVE
UROBILINOGEN UA: 0.2 (ref 0.0–1.0)

## 2017-01-17 LAB — CBC WITH DIFFERENTIAL/PLATELET
BASOS ABS: 0.1 10*3/uL (ref 0.0–0.1)
Basophils Relative: 0.8 % (ref 0.0–3.0)
Eosinophils Absolute: 0.2 10*3/uL (ref 0.0–0.7)
Eosinophils Relative: 2.5 % (ref 0.0–5.0)
HCT: 38.5 % (ref 36.0–46.0)
Hemoglobin: 12.9 g/dL (ref 12.0–15.0)
LYMPHS ABS: 2.9 10*3/uL (ref 0.7–4.0)
Lymphocytes Relative: 34.6 % (ref 12.0–46.0)
MCHC: 33.5 g/dL (ref 30.0–36.0)
MCV: 82.2 fl (ref 78.0–100.0)
MONO ABS: 0.6 10*3/uL (ref 0.1–1.0)
MONOS PCT: 7.6 % (ref 3.0–12.0)
NEUTROS PCT: 54.5 % (ref 43.0–77.0)
Neutro Abs: 4.6 10*3/uL (ref 1.4–7.7)
Platelets: 225 10*3/uL (ref 150.0–400.0)
RBC: 4.69 Mil/uL (ref 3.87–5.11)
RDW: 14.8 % (ref 11.5–15.5)
WBC: 8.4 10*3/uL (ref 4.0–10.5)

## 2017-01-17 LAB — LIPID PANEL
CHOLESTEROL: 160 mg/dL (ref 0–200)
HDL: 49.3 mg/dL (ref >39.00)
LDL Cholesterol: 77 mg/dL (ref 0–99)
NonHDL: 110.34
TRIGLYCERIDES: 165 mg/dL — AB (ref 0.0–149.0)
Total CHOL/HDL Ratio: 3
VLDL: 33 mg/dL (ref 0.0–40.0)

## 2017-01-17 LAB — HEMOGLOBIN A1C: HEMOGLOBIN A1C: 5.8 % (ref 4.6–6.5)

## 2017-01-17 LAB — TSH: TSH: 2.38 u[IU]/mL (ref 0.35–4.50)

## 2017-01-18 ENCOUNTER — Encounter: Payer: Self-pay | Admitting: Internal Medicine

## 2017-01-18 ENCOUNTER — Ambulatory Visit (INDEPENDENT_AMBULATORY_CARE_PROVIDER_SITE_OTHER): Payer: BC Managed Care – PPO | Admitting: Internal Medicine

## 2017-01-18 ENCOUNTER — Ambulatory Visit (INDEPENDENT_AMBULATORY_CARE_PROVIDER_SITE_OTHER)
Admission: RE | Admit: 2017-01-18 | Discharge: 2017-01-18 | Disposition: A | Discharge status: Home / Self Care | Payer: BC Managed Care – PPO | Source: Ambulatory Visit | Attending: Internal Medicine | Admitting: Internal Medicine

## 2017-01-18 VITALS — BP 126/84 | HR 78 | Ht 62.0 in | Wt 181.0 lb

## 2017-01-18 DIAGNOSIS — M25561 Pain in right knee: Secondary | ICD-10-CM

## 2017-01-18 DIAGNOSIS — Z114 Encounter for screening for human immunodeficiency virus [HIV]: Secondary | ICD-10-CM

## 2017-01-18 DIAGNOSIS — Z0000 Encounter for general adult medical examination without abnormal findings: Secondary | ICD-10-CM | POA: Diagnosis not present

## 2017-01-18 MED ORDER — AMLODIPINE BESYLATE 5 MG PO TABS: 5.0000 mg | ORAL_TABLET | Freq: Every day | ORAL | 3 refills | Status: DC

## 2017-01-18 MED ORDER — ATORVASTATIN CALCIUM 10 MG PO TABS: ORAL_TABLET | ORAL | 3 refills | Status: DC

## 2017-01-18 MED ORDER — DICLOFENAC SODIUM 1 % TD GEL: 4.0000 g | Freq: Four times a day (QID) | TRANSDERMAL | 5 refills | Status: DC | PRN

## 2017-01-18 NOTE — Progress Notes (Signed)
Subjective:    Patient ID: Sylvia Davies, female    DOB: [birth year], 58 y.o.   MRN: [id number]  HPI  Here for wellness and f/u;  Overall doing ok;  Pt denies Chest pain, worsening SOB, DOE, wheezing, orthopnea, PND, worsening LE edema, palpitations, dizziness or syncope.  Pt denies neurological change such as new headache, facial or extremity weakness.  Pt denies polydipsia, polyuria, or low sugar symptoms. Pt states overall good compliance with treatment and medications, good tolerability, and has been trying to follow appropriate diet.  Pt denies worsening depressive symptoms, suicidal ideation or panic. No fever, night sweats, wt loss, loss of appetite, or other constitutional symptoms.  Pt states good ability with ADL's, has low fall risk, home safety reviewed and adequate, no other significant changes in hearing or vision, and only occasionally active with exercise.  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  Now severe, throbbing type pain, worse at night, nsaid only help a few hours, no giveaways or falls. No  Prior hx of known djd.  No injury Past Medical History:  Diagnosis Date  . ALLERGIC RHINITIS 04/02/2007  . BREAST BIOPSY, HX OF 04/02/2007  . Diverticulosis   . FIBROCYSTIC BREAST DISEASE, HX OF 04/02/2007  . Hemorrhoids   . HYPERLIPIDEMIA 04/02/2007  . HYPERTENSION, BENIGN ESSENTIAL 05/04/2007  . Impaired glucose tolerance 11/08/2012  . Irritable bowel syndrome 04/02/2007  . MITRAL REGURGITATION, 0 (MILD) 04/02/2007  . OBESITY 04/02/2007   Past Surgical History:  Procedure Laterality Date  . BREAST BIOPSY Left   . COLONOSCOPY    . DG 5TH DIGIT LEFT FOOT Left   . DILATATION & CURETTAGE/HYSTEROSCOPY WITH MYOSURE N/A 06/02/2016   Procedure:  DILATATION & CURETTAGE/HYSTEROSCOPY WITH MYOSURE;  Surgeon: Thurnell Lose, MD;  Location: [facility];  Service: Gynecology;  Laterality: N/A;  Myosure - Endometrial Mass  . EYE SURGERY Left    tear duct  . LEG SURGERY Left    have rods and srcews    reports that she has never smoked. She has never used smokeless tobacco. She reports that she does not drink alcohol or use drugs. family history includes Diabetes in her maternal grandmother; Hypertension in her mother. Allergies  Allergen Reactions  . Amlodipine Besy-Benazepril Hcl     REACTION: itch and sore throat  . Cleocin [Clindamycin Hcl] Itching  . Hyoscyamine   . Penicillins     Has patient had a PCN reaction causing immediate rash, facial/tongue/throat swelling, SOB or lightheadedness with hypotension: yes Has patient had a PCN reaction causing severe rash involving mucus membranes or skin necrosis: no Has patient had a PCN reaction that required hospitalization no Has patient had a PCN reaction occurring within the last 10 years: yes If all of the above answers are "NO", then may proceed with Cephalosporin use.   . Tramadol Hives  . Zylet [Loteprednol-Tobramycin] Itching   Current Outpatient Prescriptions on File Prior to Visit  Medication Sig Dispense Refill  . aspirin 81 MG tablet Take 81 mg by mouth daily.      . Multiple Vitamins-Minerals (MULTIVITAMIN PO) Take 1 tablet by mouth daily.     No current facility-administered medications on file prior to visit.  Review of Systems Constitutional: Negative for other unusual diaphoresis, sweats, appetite or weight changes HENT: Negative for other worsening hearing loss, ear pain, facial swelling, mouth sores or neck stiffness.   Eyes: Negative for other worsening pain, redness or other visual disturbance.  Respiratory: Negative for other stridor or swelling Cardiovascular: Negative for other palpitations or other chest pain  Gastrointestinal: Negative for worsening diarrhea or  loose stools, blood in stool, distention or other pain Genitourinary: Negative for hematuria, flank pain or other change in urine volume.  Musculoskeletal: Negative for myalgias or other joint swelling.  Skin: Negative for other color change, or other wound or worsening drainage.  Neurological: Negative for other syncope or numbness. Hematological: Negative for other adenopathy or swelling Psychiatric/Behavioral: Negative for hallucinations, other worsening agitation, SI, self-injury, or new decreased concentration All other system neg per pt    Objective:   Physical Exam BP 126/84   Pulse 78   Ht 5\' 2"  (1.575 m)   Wt 181 lb (82.1 kg)   LMP 07/31/2013   SpO2 98%   BMI 33.11 kg/m  VS noted,  Constitutional: Pt is oriented to person, place, and time. Appears well-developed and well-nourished, in no significant distress and comfortable Head: Normocephalic and atraumatic  Eyes: Conjunctivae and EOM are normal. Pupils are equal, round, and reactive to light Right Ear: External ear normal without discharge Left Ear: External ear normal without discharge Nose: Nose without discharge or deformity Mouth/Throat: Oropharynx is without other ulcerations and moist  Neck: Normal range of motion. Neck supple. No JVD present. No tracheal deviation present or significant neck LA or mass Cardiovascular: Normal rate, regular rhythm, normal heart sounds and intact distal pulses.   Pulmonary/Chest: WOB normal and breath sounds without rales or wheezing  Abdominal: Soft. Bowel sounds are normal. NT. No HSM  Musculoskeletal: Normal range of motion. Exhibits no edema except for trace LLE swelling below the knee related to prior orthopedic rod and screen placement Lymphadenopathy: Has no other cervical adenopathy.  Neurological: Pt is alert and oriented to person, place, and time. Pt has normal reflexes. No cranial nerve deficit. Motor grossly intact, Gait intact Skin: Skin is warm and dry. No rash noted or  new ulcerations Psychiatric:  Has normal mood and affect. Behavior is normal without agitation No other exam findings  Lab Results  Component Value Date   WBC 8.4 01/17/2017   HGB 12.9 01/17/2017   HCT 38.5 01/17/2017   PLT 225.0 01/17/2017   GLUCOSE 84 01/17/2017   CHOL 160 01/17/2017   TRIG 165.0 (H) 01/17/2017   HDL 49.30 01/17/2017   LDLDIRECT 144.4 10/16/2012   LDLCALC 77 01/17/2017   ALT 13 01/17/2017   AST 16 01/17/2017   NA 137 01/17/2017   K 3.8 01/17/2017   CL 101 01/17/2017   CREATININE 0.77 01/17/2017   BUN 12 01/17/2017   CO2 29 01/17/2017   TSH 2.38 01/17/2017   HGBA1C 5.8 01/17/2017       Assessment & Plan:

## 2017-01-18 NOTE — Assessment & Plan Note (Signed)
New onset, with effusion, without injury or fever, ? DJD vs other, for volt gel prn, also right knee film, refer Dr Smith/sport med

## 2017-01-18 NOTE — Assessment & Plan Note (Signed)

## 2017-01-18 NOTE — Patient Instructions (Signed)
Please take all new medication as prescribed - the voltaren gel (anti-imflammatory)  Please continue all other medications as before, and refills have been done if requested.  Please have the pharmacy call with any other refills you may need.  Please continue your efforts at being more active, low cholesterol diet, and weight control.  You are otherwise up to date with prevention measures today.  You will be contacted regarding the referral for: Dr Tamala Julian (or you can make appt as you leave)  Please keep your appointments with your specialists as you may have planned  Please go to the XRAY Department in the Basement (go straight as you get off the elevator) for the x-ray testing  You will be contacted by phone if any changes need to be made immediately.  Otherwise, you will receive a letter about your results with an explanation, but please check with MyChart first.  Please remember to sign up for MyChart if you have not done so, as this will be important to you in the future with finding out test results, communicating by private email, and scheduling acute appointments online when needed.  Please return in 1 year for your yearly visit, or sooner if needed, with Lab testing done 3-5 days before

## 2017-01-19 ENCOUNTER — Encounter: Payer: Self-pay | Admitting: Internal Medicine

## 2017-01-21 ENCOUNTER — Telehealth: Payer: Self-pay | Admitting: Internal Medicine

## 2017-01-21 MED ORDER — MELOXICAM 15 MG PO TABS: 15.0000 mg | ORAL_TABLET | Freq: Every day | ORAL | 5 refills | Status: DC | PRN

## 2017-01-21 NOTE — Telephone Encounter (Signed)
Ok to try change to mobic 15 mg daily prn  - done erx

## 2017-01-21 NOTE — Telephone Encounter (Signed)
I tried a PA earlier this week for this medication but was unsuccessful. Could something similar be called in for this patient? Please advise.

## 2017-01-21 NOTE — Telephone Encounter (Signed)
Pt needs a PA for diclofenac sodium (VOLTAREN) 1 % GEL  She said we can either do the PA or to please send in something similar and cheaper  And if we have any samples she would like some of those as well  Please advise

## 2017-02-08 ENCOUNTER — Ambulatory Visit: Payer: Self-pay

## 2017-02-08 ENCOUNTER — Ambulatory Visit (INDEPENDENT_AMBULATORY_CARE_PROVIDER_SITE_OTHER): Payer: BC Managed Care – PPO | Admitting: Family Medicine

## 2017-02-08 ENCOUNTER — Encounter: Payer: Self-pay | Admitting: Family Medicine

## 2017-02-08 VITALS — BP 130/82 | HR 69 | Ht 62.0 in | Wt 180.0 lb

## 2017-02-08 DIAGNOSIS — M25561 Pain in right knee: Secondary | ICD-10-CM

## 2017-02-08 DIAGNOSIS — M84361A Stress fracture, right tibia, initial encounter for fracture: Secondary | ICD-10-CM | POA: Insufficient documentation

## 2017-02-08 MED ORDER — VITAMIN D (ERGOCALCIFEROL) 1.25 MG (50000 UNIT) PO CAPS: 50000.0000 [IU] | ORAL_CAPSULE | ORAL | 0 refills | Status: DC

## 2017-02-08 NOTE — Patient Instructions (Signed)
Good to see you  Small stress fracture noted Once weekly vitamin D for 12 weeks.  Over the counter find  Tart cherry extract any dose at night Turmeric 500mg  daily  Compression calf sleeve can help with activity and your walking.  Good shoes out of the house and in the house.  See me again in 3-4 weeks to make sure you are doing well.

## 2017-02-08 NOTE — Assessment & Plan Note (Signed)
Patient does have what seems to be more of a stress fracture of the tibia. Discussed with patient at great length, we discussed icing regimen and home exercises. Discussed which activities doing which ones to avoid. Compression discussed. Once weekly vitamin D to help with healing. Patient will follow-up again in 4 weeks for further evaluation.

## 2017-02-08 NOTE — Progress Notes (Signed)
[facility] [address] [place], [place] [address] Phone: [phone] Subjective:    I'm seeing this patient by the request  of:  Sylvia Borg, MD   CC: Right leg pain  VVO:[id number]  Sylvia Davies is a 56 y.o. female coming in with complaint of right leg pain. Seems to be more located around her knee. Patient states Been hurting her more after activity as well as at night. Morbid throbbing aching sensation. Patient is severity of pain is 8 out of 10 when it does occur. Patient likes to be active and tries to walk on a more regular basis. Patient does not remember any true injury. Only working out she has been doing his walking.   patient did have x-rays taken 01/18/2017. These were independently visualized by me showing no acute bony abnormalities.  Past Medical History:  Diagnosis Date  . ALLERGIC RHINITIS 04/02/2007  . BREAST BIOPSY, HX OF 04/02/2007  . Diverticulosis   . FIBROCYSTIC BREAST DISEASE, HX OF 04/02/2007  . Hemorrhoids   . HYPERLIPIDEMIA 04/02/2007  . HYPERTENSION, BENIGN ESSENTIAL 05/04/2007  . Impaired glucose tolerance 11/08/2012  . Irritable bowel syndrome 04/02/2007  . MITRAL REGURGITATION, 0 (MILD) 04/02/2007  . OBESITY 04/02/2007   Past Surgical History:  Procedure Laterality Date  . BREAST BIOPSY Left   . COLONOSCOPY    . DG 5TH DIGIT LEFT FOOT Left   . DILATATION & CURETTAGE/HYSTEROSCOPY WITH MYOSURE N/A 06/02/2016   Procedure: DILATATION & CURETTAGE/HYSTEROSCOPY WITH MYOSURE;  Surgeon: Thurnell Lose, MD;  Location: [facility];  Service: Gynecology;  Laterality: N/A;  Myosure - Endometrial Mass  . EYE SURGERY Left    tear duct  . LEG SURGERY Left    have rods and srcews   Social History   Social History  . Marital status: Married    Spouse name: N/A  . Number of children: 2  . Years of education: N/A   Occupational History  . [employer] History Main Topics  . Smoking status:  Never Smoker  . Smokeless tobacco: Never Used  . Alcohol use No  . Drug use: No  . Sexual activity: Not Asked   Other Topics Concern  . None   Social History Narrative  . None   Allergies  Allergen Reactions  . Amlodipine Besy-Benazepril Hcl     REACTION: itch and sore throat  . Cleocin [Clindamycin Hcl] Itching  . Hyoscyamine   . Penicillins     Has patient had a PCN reaction causing immediate rash, facial/tongue/throat swelling, SOB or lightheadedness with hypotension: yes Has patient had a PCN reaction causing severe rash involving mucus membranes or skin necrosis: no Has patient had a PCN reaction that required hospitalization no Has patient had a PCN reaction occurring within the last 10 years: yes If all of the above answers are "NO", then may proceed with Cephalosporin use.   . Tramadol Hives  . Zylet [Loteprednol-Tobramycin] Itching   Family History  Problem Relation Age of Onset  . Hypertension Mother   . Diabetes Maternal Grandmother      Past medical history, social, surgical and family history all reviewed in electronic medical record.  No pertanent information unless stated regarding to the chief complaint.   Review of Systems:Review of systems updated and as accurate as of 02/08/17  No headache, visual changes, nausea, vomiting, diarrhea, constipation, dizziness, abdominal pain, skin rash, fevers, chills, night sweats, weight loss, swollen lymph nodes,  body aches, joint swelling, muscle aches, chest pain, shortness of breath, mood changes.   Objective  Blood pressure 130/82, pulse 69, height 5\' 2"  (1.575 m), weight 180 lb (81.6 kg), last menstrual period 07/31/2013, SpO2 98 %. Systems examined below as of 02/08/17   General: No apparent distress alert and oriented x3 mood and affect normal, dressed appropriately. Mild short stature HEENT: Pupils equal, extraocular movements intact  Respiratory: Patient's speak in full sentences and does not appear short of  breath  Cardiovascular: No lower extremity edema, non tender, no erythema  Skin: Warm dry intact with no signs of infection or rash on extremities or on axial skeleton.  Abdomen: Soft nontender  Neuro: Cranial nerves II through XII are intact, neurovascularly intact in all extremities with 2+ DTRs and 2+ pulses.  Lymph: No lymphadenopathy of posterior or anterior cervical chain or axillae bilaterally.  Gait normal with good balance and coordination.  MSK:  Non tender with full range of motion and good stability and symmetric strength and tone of shoulders, elbows, wrist, hip, and ankles bilaterally. Very mild arthritic changes. Knee: Right Normal to inspection with no erythema or effusion or obvious bony abnormalities. Palpation normal with no warmth, joint line tenderness, patellar tenderness, or condyle tenderness. Tender over the medial tibial area approximately ROM full in flexion and extension and lower leg rotation. Ligaments with solid consistent endpoints including ACL, PCL, LCL, MCL. Negative Mcmurray's, Apley's, and Thessalonian tests. Non painful patellar compression. Patellar glide without crepitus. Patient is tender to palpation over the medial tibial area Patellar and quadriceps tendons unremarkable. Hamstring and quadriceps strength is normal.   MSK US performed of: Right knee This study was ordered, performed, and interpreted by Charlann Boxer D.O.  Knee: Very mild osteophytic changes of the patellofemoral joint. Patient does have some degenerative changes of the lateral meniscus but very minimal. No hypoechoic changes or increasing Doppler flow. Medial aspect of the knee does show that patient does have a lipoma proximally. Just inferior to this patient does have what appears to be hypoechoic changes with increasing Doppler flow with on the tibia itself. No true cortical defect noted  IMPRESSION:  Lipoma with underlying stress fracture   Impression and Recommendations:      This case required medical decision making of moderate complexity.      Note: This dictation was prepared with Dragon dictation along with smaller phrase technology. Any transcriptional errors that result from this process are unintentional.

## 2017-02-26 ENCOUNTER — Other Ambulatory Visit: Payer: Self-pay | Admitting: Internal Medicine

## 2017-02-28 ENCOUNTER — Encounter: Payer: Self-pay | Admitting: Family Medicine

## 2017-02-28 ENCOUNTER — Ambulatory Visit (INDEPENDENT_AMBULATORY_CARE_PROVIDER_SITE_OTHER): Payer: BC Managed Care – PPO | Admitting: Family Medicine

## 2017-02-28 DIAGNOSIS — M84361D Stress fracture, right tibia, subsequent encounter for fracture with routine healing: Secondary | ICD-10-CM | POA: Diagnosis not present

## 2017-02-28 MED ORDER — DICLOFENAC SODIUM 2 % TD SOLN: 2.0000 "application " | Freq: Two times a day (BID) | TRANSDERMAL | 3 refills | Status: DC

## 2017-02-28 NOTE — Progress Notes (Signed)
[facility] [address], [place] [address] Phone: [phone] Subjective:    I'm seeing this patient by the request  of:  Biagio Borg, MD   CC: Right leg pain f/u  [id number]  Sylvia Davies is a 56 y.o. female coming in with complaint of right leg pain. Found to have more of a tibial stress fracture. Doing well with once weekly vitamin D. Has been doing 75% better. Patient still states that when she does increase activity has some discomfort. Nothing severe.   patient did have x-rays taken 01/18/2017. These were independently visualized by me showing no acute bony abnormalities.  Past Medical History:  Diagnosis Date  . ALLERGIC RHINITIS 04/02/2007  . BREAST BIOPSY, HX OF 04/02/2007  . Diverticulosis   . FIBROCYSTIC BREAST DISEASE, HX OF 04/02/2007  . Hemorrhoids   . HYPERLIPIDEMIA 04/02/2007  . HYPERTENSION, BENIGN ESSENTIAL 05/04/2007  . Impaired glucose tolerance 11/08/2012  . Irritable bowel syndrome 04/02/2007  . MITRAL REGURGITATION, 0 (MILD) 04/02/2007  . OBESITY 04/02/2007   Past Surgical History:  Procedure Laterality Date  . BREAST BIOPSY Left   . COLONOSCOPY    . DG 5TH DIGIT LEFT FOOT Left   . DILATATION & CURETTAGE/HYSTEROSCOPY WITH MYOSURE N/A 06/02/2016   Procedure: DILATATION & CURETTAGE/HYSTEROSCOPY WITH MYOSURE;  Surgeon: Thurnell Lose, MD;  Location: [facility];  Service: Gynecology;  Laterality: N/A;  Myosure - Endometrial Mass  . EYE SURGERY Left    tear duct  . LEG SURGERY Left    have rods and srcews   Social History   Social History  . Marital status: Married    Spouse name: N/A  . Number of children: 2  . Years of education: N/A   Occupational History  . [employer] History Main Topics  . Smoking status: Never Smoker  . Smokeless tobacco: Never Used  . Alcohol use No  . Drug use: No  . Sexual activity: Not Asked   Other Topics Concern  . None   Social  History Narrative  . None   Allergies  Allergen Reactions  . Amlodipine Besy-Benazepril Hcl     REACTION: itch and sore throat  . Cleocin [Clindamycin Hcl] Itching  . Hyoscyamine   . Penicillins     Has patient had a PCN reaction causing immediate rash, facial/tongue/throat swelling, SOB or lightheadedness with hypotension: yes Has patient had a PCN reaction causing severe rash involving mucus membranes or skin necrosis: no Has patient had a PCN reaction that required hospitalization no Has patient had a PCN reaction occurring within the last 10 years: yes If all of the above answers are "NO", then may proceed with Cephalosporin use.   . Tramadol Hives  . Zylet [Loteprednol-Tobramycin] Itching   Family History  Problem Relation Age of Onset  . Hypertension Mother   . Diabetes Maternal Grandmother      Past medical history, social, surgical and family history all reviewed in electronic medical record.  No pertanent information unless stated regarding to the chief complaint.   Review of Systems: No headache, visual changes, nausea, vomiting, diarrhea, constipation, dizziness, abdominal pain, skin rash, fevers, chills, night sweats, weight loss, swollen lymph nodes, body aches, joint swelling, muscle aches, chest pain, shortness of breath, mood changes.    Objective  Blood pressure 130/78, pulse 70, height 5\' 2"  (1.575 m), weight 180 lb (81.6 kg), last menstrual period 07/31/2013.  Systems examined below as of  02/28/17 General: NAD A&O x3 mood, affect normal  HEENT: Pupils equal, extraocular movements intact no nystagmus Respiratory: not short of breath at rest or with speaking Cardiovascular: No lower extremity edema, non tender Skin: Warm dry intact with no signs of infection or rash on extremities or on axial skeleton. Abdomen: Soft nontender, no masses Neuro: Cranial nerves  intact, neurovascularly intact in all extremities with 2+ DTRs and 2+ pulses. Lymph: No  lymphadenopathy appreciated today  Gait Antalgic MSK: Non tender with full range of motion and good stability and symmetric strength and tone of shoulders, elbows, wrist,  hips and ankles bilaterally.   Knee: Right Normal to inspection with no erythema or effusion or obvious bony abnormalities. Continued tenderness over the medial joint line as well as some medial aspect of the tibia just distal to the joint line ROM full in flexion and extension and lower leg rotation. Ligaments with solid consistent endpoints including ACL, PCL, LCL, MCL. Negative Mcmurray's, Apley's, and Thessalonian tests. Mild painful patellar compression. Patellar glide without crepitus. Patient is tender to palpation over the medial tibial area Patellar and quadriceps tendons unremarkable. Hamstring and quadriceps strength is normal.  Contralateral knee unremarkable     Impression and Recommendations:     This case required medical decision making of moderate complexity.      Note: This dictation was prepared with Dragon dictation along with smaller phrase technology. Any transcriptional errors that result from this process are unintentional.

## 2017-02-28 NOTE — Assessment & Plan Note (Signed)
Right still giving pain.  Doing well  Ice will be good.  Continue the vitamin D and rx pennsaid.  See me again in 4 weeks.

## 2017-02-28 NOTE — Patient Instructions (Signed)
Good to see you  Alvera Singh is your friend.  pennsaid pinkie amount topically 2 times daily as needed.   Keep it up  See me again in 6-8 weeks.

## 2017-04-05 ENCOUNTER — Ambulatory Visit (INDEPENDENT_AMBULATORY_CARE_PROVIDER_SITE_OTHER): Payer: BC Managed Care – PPO | Admitting: Nurse Practitioner

## 2017-04-05 ENCOUNTER — Encounter: Payer: Self-pay | Admitting: Nurse Practitioner

## 2017-04-05 ENCOUNTER — Other Ambulatory Visit: Payer: BC Managed Care – PPO

## 2017-04-05 VITALS — BP 136/84 | HR 67 | Temp 98.0°F | Ht 62.0 in | Wt 180.0 lb

## 2017-04-05 DIAGNOSIS — R109 Unspecified abdominal pain: Secondary | ICD-10-CM | POA: Diagnosis not present

## 2017-04-05 DIAGNOSIS — L602 Onychogryphosis: Secondary | ICD-10-CM | POA: Diagnosis not present

## 2017-04-05 NOTE — Progress Notes (Signed)
Subjective:  Patient ID: Sylvia Davies, female    DOB: June 24, 1961  Age: 56 y.o. MRN: [id number]  CC: Nail Problem (nails on left foot turn dark-painful at times--go to nail salon alot. get flu shot through her job) and GI Problem (feels like gas under rib cage goes around back,pressure. 2 days?)   Flank Pain  This is a new problem. The current episode started yesterday. The problem occurs intermittently. The problem has been waxing and waning since onset. Pain location: LUQ and back pain. The quality of the pain is described as aching. The pain is mild. The pain is the same all the time. Exacerbated by: nothing. Pertinent negatives include no abdominal pain, bladder incontinence, bowel incontinence, chest pain, dysuria, fever, headaches, numbness, paresis, paresthesias, tingling, weakness or weight loss. (No rash, no SOB, no cough.) Treatments tried: gas X this morning. The treatment provided no relief.   Left toenail changes: She has noticed change in toenails over last 52months: darker and thicker. No pain, no injury. She stopped going for pedicures at same time but no improvement.   Outpatient Medications Prior to Visit  Medication Sig Dispense Refill  . amLODipine (NORVASC) 5 MG tablet Take 1 tablet (5 mg total) by mouth daily. 90 tablet 3  . aspirin 81 MG tablet Take 81 mg by mouth daily.      [name] atorvastatin (LIPITOR) 10 MG tablet TAKE 1 TABLET (10 MG TOTAL) BY MOUTH DAILY AT 6 PM. 90 tablet 3  . Diclofenac Sodium (PENNSAID) 2 % SOLN Place 2 application onto the skin 2 (two) times daily. 112 g 3  . meloxicam (MOBIC) 15 MG tablet Take 1 tablet (15 mg total) by mouth daily as needed for pain. 30 tablet 5  . Multiple Vitamins-Minerals (MULTIVITAMIN PO) Take 1 tablet by mouth daily.    . Vitamin D, Ergocalciferol, (DRISDOL) 50000 units CAPS capsule Take 1 capsule (50,000 Units total) by mouth every 7 (seven) days. 12 capsule 0  . amLODipine (NORVASC) 5 MG tablet TAKE 1 TABLET BY MOUTH  EVERY DAY (Patient not taking: Reported on 04/05/2017) 90 tablet 3   No facility-administered medications prior to visit.     ROS See HPI  Objective:  BP 136/84   Pulse 67   Temp 98 F (36.7 C)   Ht 5\' 2"  (1.575 m)   Wt 180 lb (81.6 kg)   LMP 07/31/2013   SpO2 98%   BMI 32.92 kg/m   BP Readings from Last 3 Encounters:  04/05/17 136/84  02/28/17 130/78  02/08/17 130/82    Wt Readings from Last 3 Encounters:  04/05/17 180 lb (81.6 kg)  02/28/17 180 lb (81.6 kg)  02/08/17 180 lb (81.6 kg)    Physical Exam  Constitutional: She is oriented to person, place, and time. No distress.  Neck: Normal range of motion. Neck supple.  Cardiovascular: Normal rate.   Pulmonary/Chest: Effort normal and breath sounds normal.  Abdominal: Soft. Bowel sounds are normal. She exhibits no distension. There is no tenderness. There is no rebound and no guarding.  Neurological: She is alert and oriented to person, place, and time.  Skin: Skin is warm and dry. No rash noted. No erythema.  Left 2nd and 3rd toe hyperpigmented and hypertrophy. No macerated skin between toes.  Psychiatric: She has a normal mood and affect. Her behavior is normal.  Vitals reviewed.   Lab Results  Component Value Date   WBC 8.4 01/17/2017   HGB 12.9 01/17/2017   HCT 38.5  01/17/2017   PLT 225.0 01/17/2017   GLUCOSE 84 01/17/2017   CHOL 160 01/17/2017   TRIG 165.0 (H) 01/17/2017   HDL 49.30 01/17/2017   LDLDIRECT 144.4 10/16/2012   LDLCALC 77 01/17/2017   ALT 13 01/17/2017   AST 16 01/17/2017   NA 137 01/17/2017   K 3.8 01/17/2017   CL 101 01/17/2017   CREATININE 0.77 01/17/2017   BUN 12 01/17/2017   CO2 29 01/17/2017   TSH 2.38 01/17/2017   HGBA1C 5.8 01/17/2017    Dg Knee Complete 4 Views Right  Result Date: 01/18/2017 CLINICAL DATA:  RIGHT knee pain, no known injury EXAM: RIGHT KNEE - COMPLETE 4+ VIEW COMPARISON:  03/20/2009 FINDINGS: Osseous demineralization. Joint spaces preserved. No acute  fracture, dislocation, or bone destruction. No knee joint effusion. IMPRESSION: No acute abnormalities. Electronically Signed   By: Lavonia Dana M.D.   On: 01/18/2017 19:16    Assessment & Plan:   Buelah was seen today for nail problem and gi problem.  Diagnoses and all orders for this visit:  Hypertrophic toenail -     Cult, Fungus, Skin,Hair,Nail w/KOH; Future -     Cult, Fungus, Skin,Hair,Nail w/KOH; Future   I am having Ms. Lipuma maintain her aspirin, Multiple Vitamins-Minerals (MULTIVITAMIN PO), atorvastatin, amLODipine, meloxicam, Vitamin D (Ergocalciferol), amLODipine, and Diclofenac Sodium.  No orders of the defined types were placed in this encounter.   Follow-up: No Follow-up on file.  Wilfred Lacy, NP

## 2017-04-05 NOTE — Patient Instructions (Addendum)
You may use ibuprofen or tylenol for pain.  You will be contact with nail pathology report.  Flank Pain Flank pain is pain in your side. The flank is the area of your side between your upper belly (abdomen) and your back. The pain may occur over a short period of time (acute) or may be long-term or come back often (chronic). It may be mild or very bad. Pain in this area can be caused by many different things. Follow these instructions at home:  Rest as told by your doctor.  Drink enough fluid to keep your pee (urine) clear or pale yellow.  Take over-the-counter and prescription medicines only as told by your doctor.  Keep all follow-up visits as told by your doctor. This is important. Contact a doctor if:  Medicine does not help your pain.  You have new symptoms.  Your pain gets worse.  You have a fever.  Your symptoms last longer than 2-3 days. Get help right away if:  Your tummy hurts or is swollen.  You are short of breath.  You feel sick to your stomach (nauseous) and it does not go away.  You cannot stop throwing up (vomiting).  You feel like you will pass out or you do pass out (faint).  You have blood in your pee.  You have a fever and your symptoms suddenly get worse. This information is not intended to replace advice given to you by your health care provider. Make sure you discuss any questions you have with your health care provider. Document Released: 04/06/2008 Document Revised: 03/19/2016 Document Reviewed: 04/01/2015 Elsevier Interactive Patient Education  2018 [employer].

## 2017-04-06 LAB — TIQ-NTR

## 2017-04-07 LAB — CULT, FUNGUS, SKIN,HAIR,NAIL W/KOH

## 2017-04-08 ENCOUNTER — Telehealth: Payer: Self-pay | Admitting: Nurse Practitioner

## 2017-04-08 NOTE — Telephone Encounter (Signed)
Please have patient return to office for recollection of sample. Thank you

## 2017-04-08 NOTE — Telephone Encounter (Signed)
Lab called stating that [facility] rejected the nail sample we sent in. They said it has to be in the sterile empty container (no liquid). Please advise.

## 2017-04-08 NOTE — Telephone Encounter (Signed)
Spoke with pt, she is aware

## 2017-04-08 NOTE — Telephone Encounter (Signed)
Left vm for pt to call back. Need to see if she can come back so we can get a sample maybe in about 1 wk.

## 2017-04-12 LAB — TEST AUTHORIZATION

## 2017-04-12 LAB — CULT, FUNGUS, SKIN,HAIR,NAIL W/KOH

## 2017-04-13 ENCOUNTER — Ambulatory Visit: Payer: Self-pay | Admitting: Family Medicine

## 2017-04-15 ENCOUNTER — Ambulatory Visit: Payer: Self-pay | Admitting: Nurse Practitioner

## 2017-04-15 ENCOUNTER — Other Ambulatory Visit: Payer: BC Managed Care – PPO

## 2017-04-15 ENCOUNTER — Telehealth: Payer: Self-pay | Admitting: Nurse Practitioner

## 2017-04-15 DIAGNOSIS — L602 Onychogryphosis: Secondary | ICD-10-CM

## 2017-04-15 NOTE — Telephone Encounter (Signed)
Order enter

## 2017-04-27 ENCOUNTER — Telehealth: Payer: Self-pay | Admitting: Emergency Medicine

## 2017-04-27 MED ORDER — VITAMIN D (ERGOCALCIFEROL) 1.25 MG (50000 UNIT) PO CAPS: 50000.0000 [IU] | ORAL_CAPSULE | ORAL | 0 refills | Status: DC

## 2017-04-27 NOTE — Telephone Encounter (Signed)
Pt wants to know if she can get a refill on her Vitamin D, Ergocalciferol, (DRISDOL) 50000 units CAPS capsule. Thanks.

## 2017-04-27 NOTE — Telephone Encounter (Signed)
Refill done.  

## 2017-05-13 ENCOUNTER — Ambulatory Visit: Payer: Self-pay | Admitting: Family Medicine

## 2017-05-16 LAB — CULT, FUNGUS, SKIN,HAIR,NAIL W/KOH
MICRO NUMBER: 81110220
SMEAR:: NONE SEEN
SPECIMEN QUALITY: ADEQUATE

## 2017-05-17 ENCOUNTER — Encounter: Payer: Self-pay | Admitting: Family Medicine

## 2017-05-17 ENCOUNTER — Ambulatory Visit: Payer: BC Managed Care – PPO | Admitting: Family Medicine

## 2017-05-17 ENCOUNTER — Ambulatory Visit: Payer: Self-pay

## 2017-05-17 VITALS — BP 140/80 | HR 70 | Ht 62.0 in | Wt 179.0 lb

## 2017-05-17 DIAGNOSIS — M19071 Primary osteoarthritis, right ankle and foot: Secondary | ICD-10-CM | POA: Insufficient documentation

## 2017-05-17 DIAGNOSIS — M84361D Stress fracture, right tibia, subsequent encounter for fracture with routine healing: Secondary | ICD-10-CM | POA: Diagnosis not present

## 2017-05-17 DIAGNOSIS — M79604 Pain in right leg: Secondary | ICD-10-CM

## 2017-05-17 NOTE — Patient Instructions (Signed)
Good to see you  Sylvia Davies is your friend.  Consider a brace Shoes will be key  [employer] orthotics "total support" online would be great  Will refill Vitamin D for 1 year See me again in when it worsens

## 2017-05-17 NOTE — Assessment & Plan Note (Signed)
Discussed the possibility of bracing which patient declined.  Discussed icing regimen.  Patient will continue to be active.  Follow-up

## 2017-05-17 NOTE — Assessment & Plan Note (Signed)
Healed at this time.  Discussed icing regimen and home exercises.  We discussed what activities doing which wants to avoid.  Patient is to increase activity.  I do think the underlying arthritis is contributing.

## 2017-05-17 NOTE — Progress Notes (Signed)
[name] [place], [place] [id number] Phone: [phone] Subjective:    I'm seeing this patient by the request  of:    CC: Follow-up leg pain  NIO:[id number]  Sylvia Davies is a 56 y.o. female coming in with complaint of leg pain.  Patient was previously seen and was found to have a stress fracture of the right tibia.  Had responded well to conservative therapy including vitamin D supplementation, topical and oral anti-inflammatories, icing and compression.  Patient was to start increasing activity.  Patient states leg pain seems to be better.  Continues to have unfortunately pain of the ankle. She states that the lateral aspect seems to be better.  Still having some difficulty especially when it is cold outside of walking regularly.      Past Medical History:  Diagnosis Date  . ALLERGIC RHINITIS 04/02/2007  . BREAST BIOPSY, HX OF 04/02/2007  . Diverticulosis   . FIBROCYSTIC BREAST DISEASE, HX OF 04/02/2007  . Hemorrhoids   . HYPERLIPIDEMIA 04/02/2007  . HYPERTENSION, BENIGN ESSENTIAL 05/04/2007  . Impaired glucose tolerance 11/08/2012  . Irritable bowel syndrome 04/02/2007  . MITRAL REGURGITATION, 0 (MILD) 04/02/2007  . OBESITY 04/02/2007   Past Surgical History:  Procedure Laterality Date  . BREAST BIOPSY Left   . COLONOSCOPY    . DG 5TH DIGIT LEFT FOOT Left   . EYE SURGERY Left    tear duct  . LEG SURGERY Left    have rods and srcews   Social History   Socioeconomic History  . Marital status: Married    Spouse name: None  . Number of children: 2  . Years of education: None  . Highest education level: None  Social Needs  . Financial resource strain: None  . Food insecurity - worry: None  . Food insecurity - inability: None  . Transportation needs - medical: None  . Transportation needs - non-medical: None  Occupational History  . Occupation: Photographer: [employer]  Tobacco Use  . Smoking  status: Never Smoker  . Smokeless tobacco: Never Used  Substance and Sexual Activity  . Alcohol use: No  . Drug use: No  . Sexual activity: None  Other Topics Concern  . None  Social History Narrative  . None   Allergies  Allergen Reactions  . Amlodipine Besy-Benazepril Hcl     REACTION: itch and sore throat  . Cleocin [Clindamycin Hcl] Itching  . Hyoscyamine   . Penicillins     Has patient had a PCN reaction causing immediate rash, facial/tongue/throat swelling, SOB or lightheadedness with hypotension: yes Has patient had a PCN reaction causing severe rash involving mucus membranes or skin necrosis: no Has patient had a PCN reaction that required hospitalization no Has patient had a PCN reaction occurring within the last 10 years: yes If all of the above answers are "NO", then may proceed with Cephalosporin use.   . Tramadol Hives  . Zylet [Loteprednol-Tobramycin] Itching   Family History  Problem Relation Age of Onset  . Hypertension Mother   . Diabetes Maternal Grandmother      Past medical history, social, surgical and family history all reviewed in electronic medical record.  No pertanent information unless stated regarding to the chief complaint.   Review of Systems:Review of systems updated and as accurate as of 05/17/17  No headache, visual changes, nausea, vomiting, diarrhea, constipation, dizziness, abdominal pain, skin rash, fevers, chills, night sweats, weight  loss, swollen lymph nodes, body aches, joint swelling,  chest pain, shortness of breath, mood changes.  Positive muscle aches  Objective  Blood pressure 140/80, pulse 70, height 5\' 2"  (1.575 m), weight 179 lb (81.2 kg), last menstrual period 07/31/2013, SpO2 96 %. Systems examined below as of 05/17/17   General: No apparent distress alert and oriented x3 mood and affect normal, dressed appropriately.  HEENT: Pupils equal, extraocular movements intact  Respiratory: Patient's speak in full sentences and  does not appear short of breath  Cardiovascular: No lower extremity edema, non tender, no erythema  Skin: Warm dry intact with no signs of infection or rash on extremities or on axial skeleton.  Abdomen: Soft nontender  Neuro: Cranial nerves II through XII are intact, neurovascularly intact in all extremities with 2+ DTRs and 2+ pulses.  Lymph: No lymphadenopathy of posterior or anterior cervical chain or axillae bilaterally.  Gait normal with good balance and coordination.  MSK:  Non tender with full range of motion and good stability and symmetric strength and tone of shoulders, elbows, wrist, hip, knee bilaterally.   Ankle:right  Mild arthritic changes  Range of motion is decreased in all planes Strength is 4/5 in all directions. Stable lateral and medial ligaments; squeeze test and kleiger test unremarkable; Talar dome minorly tender; No pain at base of 5th MT; No tenderness over cuboid; No tenderness over N spot or navicular prominence No tenderness on posterior aspects of lateral and medial malleolus No sign of peroneal tendon subluxations or tenderness to palpation Negative tarsal tunnel tinel's Able to walk 4 steps. Contralateral ankle unremarkable  MSK US performed of: Right ankle This study was ordered, performed, and interpreted by Charlann Boxer D.O.  Foot/Ankle:   Arthritic changes of the talar dome and talonavicular joint Patient's previous stress fracture has completely healed at this time.  IMPRESSION: arthritic changes        Impression and Recommendations:     This case required medical decision making of moderate complexity.      Note: This dictation was prepared with Dragon dictation along with smaller phrase technology. Any transcriptional errors that result from this process are unintentional.

## 2017-05-24 ENCOUNTER — Telehealth: Payer: Self-pay | Admitting: Family Medicine

## 2017-05-24 NOTE — Telephone Encounter (Signed)
Pt has called the pharmacy and she would like a letter sent to her confirming when this has been canceled so if the pharmacy send her more she will not pay for it.

## 2017-05-24 NOTE — Telephone Encounter (Signed)
Pt called and would like her PENNSAID rx canceled they are being shipped quicker than she is using them, she has three right now and when they ship she is getting charged and cannot afford them, she will call back when she is out an needs a refill

## 2017-06-08 ENCOUNTER — Other Ambulatory Visit: Payer: Self-pay | Admitting: Family Medicine

## 2017-06-17 ENCOUNTER — Other Ambulatory Visit: Payer: Self-pay

## 2017-06-17 ENCOUNTER — Emergency Department (HOSPITAL_COMMUNITY)
Admission: EM | Admit: 2017-06-17 | Discharge: 2017-06-18 | Disposition: A | Discharge status: Home / Self Care | Payer: BC Managed Care – PPO | Attending: Emergency Medicine | Admitting: Emergency Medicine

## 2017-06-17 DIAGNOSIS — Z7982 Long term (current) use of aspirin: Secondary | ICD-10-CM | POA: Insufficient documentation

## 2017-06-17 DIAGNOSIS — S20212A Contusion of left front wall of thorax, initial encounter: Secondary | ICD-10-CM | POA: Insufficient documentation

## 2017-06-17 DIAGNOSIS — Y92410 Unspecified street and highway as the place of occurrence of the external cause: Secondary | ICD-10-CM | POA: Diagnosis not present

## 2017-06-17 DIAGNOSIS — I10 Essential (primary) hypertension: Secondary | ICD-10-CM | POA: Diagnosis not present

## 2017-06-17 DIAGNOSIS — Z79899 Other long term (current) drug therapy: Secondary | ICD-10-CM | POA: Insufficient documentation

## 2017-06-17 DIAGNOSIS — Y999 Unspecified external cause status: Secondary | ICD-10-CM | POA: Insufficient documentation

## 2017-06-17 DIAGNOSIS — S299XXA Unspecified injury of thorax, initial encounter: Secondary | ICD-10-CM | POA: Diagnosis present

## 2017-06-17 DIAGNOSIS — S81012A Laceration without foreign body, left knee, initial encounter: Secondary | ICD-10-CM | POA: Insufficient documentation

## 2017-06-17 DIAGNOSIS — Y9389 Activity, other specified: Secondary | ICD-10-CM | POA: Diagnosis not present

## 2017-06-17 DIAGNOSIS — M7918 Myalgia, other site: Secondary | ICD-10-CM | POA: Insufficient documentation
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
## 2017-06-18 ENCOUNTER — Emergency Department (HOSPITAL_COMMUNITY): Payer: BC Managed Care – PPO

## 2017-06-18 DIAGNOSIS — S20212A Contusion of left front wall of thorax, initial encounter: Secondary | ICD-10-CM | POA: Diagnosis not present

## 2017-06-18 MED ORDER — METHOCARBAMOL 500 MG PO TABS: 500.0000 mg | ORAL_TABLET | Freq: Two times a day (BID) | ORAL | 0 refills | Status: DC

## 2017-06-18 MED ORDER — OXYCODONE-ACETAMINOPHEN 5-325 MG PO TABS
1.0000 | ORAL_TABLET | Freq: Once | ORAL | Status: AC
  Administered 2017-06-18: 1 via ORAL
  Filled 2017-06-18: qty 1

## 2017-06-18 MED ORDER — LIDOCAINE-EPINEPHRINE (PF) 2 %-1:200000 IJ SOLN
10.0000 mL | Freq: Once | INTRAMUSCULAR | Status: AC
  Administered 2017-06-18: 10 mL
  Filled 2017-06-18: qty 20

## 2017-06-18 MED ORDER — TETANUS-DIPHTH-ACELL PERTUSSIS 5-2.5-18.5 LF-MCG/0.5 IM SUSP
0.5000 mL | Freq: Once | INTRAMUSCULAR | Status: DC
  Filled 2017-06-18: qty 0.5

## 2017-06-18 MED ORDER — OXYCODONE-ACETAMINOPHEN 5-325 MG PO TABS: 1.0000 | ORAL_TABLET | ORAL | 0 refills | Status: DC | PRN
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
## 2017-06-18 NOTE — ED Notes (Signed)
Patient transported to X-ray 

## 2017-06-24 ENCOUNTER — Ambulatory Visit (HOSPITAL_COMMUNITY)
Admission: EM | Admit: 2017-06-24 | Discharge: 2017-06-24 | Disposition: A | Discharge status: Home / Self Care | Payer: BC Managed Care – PPO

## 2017-06-24 ENCOUNTER — Encounter (HOSPITAL_COMMUNITY): Payer: Self-pay | Admitting: Emergency Medicine

## 2017-06-24 NOTE — ED Triage Notes (Signed)
Pt here for suture removal  Voice no new concerns  A&O X4... NAD... Ambulatory

## 2017-07-20 ENCOUNTER — Ambulatory Visit (INDEPENDENT_AMBULATORY_CARE_PROVIDER_SITE_OTHER)
Admission: RE | Admit: 2017-07-20 | Discharge: 2017-07-20 | Disposition: A | Discharge status: Home / Self Care | Payer: BC Managed Care – PPO | Source: Ambulatory Visit | Attending: Family | Admitting: Family

## 2017-07-20 ENCOUNTER — Encounter: Payer: Self-pay | Admitting: Family

## 2017-07-20 ENCOUNTER — Ambulatory Visit: Payer: BC Managed Care – PPO | Admitting: Family

## 2017-07-20 ENCOUNTER — Other Ambulatory Visit: Payer: Self-pay

## 2017-07-20 VITALS — BP 138/80 | HR 83 | Temp 98.0°F | Ht 62.0 in | Wt 177.1 lb

## 2017-07-20 DIAGNOSIS — L089 Local infection of the skin and subcutaneous tissue, unspecified: Secondary | ICD-10-CM | POA: Diagnosis not present

## 2017-07-20 DIAGNOSIS — M25562 Pain in left knee: Secondary | ICD-10-CM

## 2017-07-20 DIAGNOSIS — Z114 Encounter for screening for human immunodeficiency virus [HIV]: Secondary | ICD-10-CM

## 2017-07-20 MED ORDER — FLUCONAZOLE 150 MG PO TABS: ORAL_TABLET | ORAL | 0 refills | Status: DC

## 2017-07-20 MED ORDER — SULFAMETHOXAZOLE-TRIMETHOPRIM 800-160 MG PO TABS: 1.0000 | ORAL_TABLET | Freq: Two times a day (BID) | ORAL | 0 refills | Status: DC
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
## 2017-07-27 ENCOUNTER — Other Ambulatory Visit: Payer: Self-pay | Admitting: Internal Medicine

## 2017-09-20 ENCOUNTER — Other Ambulatory Visit: Payer: Self-pay | Admitting: Family Medicine

## 2017-10-10 ENCOUNTER — Other Ambulatory Visit: Payer: Self-pay | Admitting: Nurse Practitioner

## 2017-10-10 DIAGNOSIS — Z139 Encounter for screening, unspecified: Secondary | ICD-10-CM

## 2017-11-21 ENCOUNTER — Ambulatory Visit
Admission: RE | Admit: 2017-11-21 | Discharge: 2017-11-21 | Disposition: A | Discharge status: Home / Self Care | Payer: BC Managed Care – PPO | Source: Ambulatory Visit | Attending: Nurse Practitioner | Admitting: Nurse Practitioner

## 2017-11-21 DIAGNOSIS — Z139 Encounter for screening, unspecified: Secondary | ICD-10-CM

## 2017-12-15 ENCOUNTER — Other Ambulatory Visit: Payer: Self-pay | Admitting: Family Medicine

## 2018-01-19 ENCOUNTER — Encounter: Payer: Self-pay | Admitting: Internal Medicine

## 2018-01-19 ENCOUNTER — Ambulatory Visit: Payer: BC Managed Care – PPO | Admitting: Family Medicine

## 2018-01-19 ENCOUNTER — Ambulatory Visit (INDEPENDENT_AMBULATORY_CARE_PROVIDER_SITE_OTHER): Payer: BC Managed Care – PPO | Admitting: Internal Medicine

## 2018-01-19 ENCOUNTER — Other Ambulatory Visit (INDEPENDENT_AMBULATORY_CARE_PROVIDER_SITE_OTHER): Payer: BC Managed Care – PPO

## 2018-01-19 VITALS — BP 132/70 | HR 85 | Ht 62.0 in | Wt 181.0 lb

## 2018-01-19 DIAGNOSIS — Z0000 Encounter for general adult medical examination without abnormal findings: Secondary | ICD-10-CM

## 2018-01-19 DIAGNOSIS — I10 Essential (primary) hypertension: Secondary | ICD-10-CM

## 2018-01-19 DIAGNOSIS — Z114 Encounter for screening for human immunodeficiency virus [HIV]: Secondary | ICD-10-CM

## 2018-01-19 LAB — LIPID PANEL
CHOL/HDL RATIO: 3
Cholesterol: 144 mg/dL (ref 0–200)
HDL: 53.6 mg/dL (ref >39.00)
LDL Cholesterol: 75 mg/dL (ref 0–99)
NONHDL: 89.96
Triglycerides: 75 mg/dL (ref 0.0–149.0)
VLDL: 15 mg/dL (ref 0.0–40.0)

## 2018-01-19 LAB — URINALYSIS, ROUTINE W REFLEX MICROSCOPIC
Bilirubin Urine: NEGATIVE
Hgb urine dipstick: NEGATIVE
Ketones, ur: NEGATIVE
Leukocytes, UA: NEGATIVE
NITRITE: NEGATIVE
PH: 6 (ref 5.0–8.0)
RBC / HPF: NONE SEEN (ref 0–?)
TOTAL PROTEIN, URINE-UPE24: NEGATIVE
UROBILINOGEN UA: 0.2 (ref 0.0–1.0)
Urine Glucose: NEGATIVE

## 2018-01-19 LAB — BASIC METABOLIC PANEL
BUN: 12 mg/dL (ref 6–23)
CALCIUM: 9.6 mg/dL (ref 8.4–10.5)
CO2: 26 meq/L (ref 19–32)
Chloride: 105 mEq/L (ref 96–112)
Creatinine, Ser: 0.63 mg/dL (ref 0.40–1.20)
GFR: 124.58 mL/min (ref >60.00)
GLUCOSE: 95 mg/dL (ref 70–99)
Potassium: 3.5 mEq/L (ref 3.5–5.1)
Sodium: 139 mEq/L (ref 135–145)

## 2018-01-19 LAB — CBC WITH DIFFERENTIAL/PLATELET
BASOS ABS: 0 10*3/uL (ref 0.0–0.1)
Basophils Relative: 0.7 % (ref 0.0–3.0)
EOS PCT: 2.4 % (ref 0.0–5.0)
Eosinophils Absolute: 0.1 10*3/uL (ref 0.0–0.7)
HCT: 38.3 % (ref 36.0–46.0)
HEMOGLOBIN: 12.7 g/dL (ref 12.0–15.0)
LYMPHS ABS: 2.2 10*3/uL (ref 0.7–4.0)
Lymphocytes Relative: 38.3 % (ref 12.0–46.0)
MCHC: 33.2 g/dL (ref 30.0–36.0)
MCV: 81 fl (ref 78.0–100.0)
MONO ABS: 0.4 10*3/uL (ref 0.1–1.0)
MONOS PCT: 7.2 % (ref 3.0–12.0)
NEUTROS PCT: 51.4 % (ref 43.0–77.0)
Neutro Abs: 2.9 10*3/uL (ref 1.4–7.7)
Platelets: 218 10*3/uL (ref 150.0–400.0)
RBC: 4.73 Mil/uL (ref 3.87–5.11)
RDW: 15.7 % — ABNORMAL HIGH (ref 11.5–15.5)
WBC: 5.7 10*3/uL (ref 4.0–10.5)

## 2018-01-19 LAB — HEPATIC FUNCTION PANEL
ALK PHOS: 106 U/L (ref 39–117)
ALT: 14 U/L (ref 0–35)
AST: 17 U/L (ref 0–37)
Albumin: 3.9 g/dL (ref 3.5–5.2)
BILIRUBIN TOTAL: 0.4 mg/dL (ref 0.2–1.2)
Bilirubin, Direct: 0.1 mg/dL (ref 0.0–0.3)
Total Protein: 7.4 g/dL (ref 6.0–8.3)

## 2018-01-19 LAB — TSH: TSH: 0.87 u[IU]/mL (ref 0.35–4.50)

## 2018-01-19 MED ORDER — AMLODIPINE BESYLATE 5 MG PO TABS: 5.0000 mg | ORAL_TABLET | Freq: Every day | ORAL | 3 refills | Status: DC

## 2018-01-19 MED ORDER — ATORVASTATIN CALCIUM 10 MG PO TABS: ORAL_TABLET | ORAL | 3 refills | Status: DC

## 2018-01-19 MED ORDER — MELOXICAM 15 MG PO TABS: ORAL_TABLET | ORAL | 1 refills | Status: DC

## 2018-01-19 NOTE — Patient Instructions (Signed)

## 2018-01-19 NOTE — Assessment & Plan Note (Signed)
stable overall by history and exam, recent data reviewed with pt, and pt to continue medical treatment as before,  to f/u any worsening symptoms or concerns BP Readings from Last 3 Encounters:  01/19/18 132/70  07/20/17 138/80  06/18/17 127/72
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
## 2018-01-19 NOTE — Assessment & Plan Note (Signed)

## 2018-01-20 LAB — HIV ANTIBODY (ROUTINE TESTING W REFLEX): HIV: NONREACTIVE

## 2018-02-14 ENCOUNTER — Telehealth: Payer: Self-pay

## 2018-02-14 NOTE — Telephone Encounter (Signed)
Sorry, I dont recall the fax information, nor any details of what exactly is meant by "xray of the leg" or the reason for it.  I would not be able to do this unless further information is obtained.  Perhaps we can call the provider in question and ask for the fax to be redone

## 2018-02-14 NOTE — Telephone Encounter (Signed)
Pt will have them refax it.

## 2018-02-14 NOTE — Telephone Encounter (Signed)
Copied from [place] [id number]. Topic: Inquiry >> Feb 14, 2018  9:44 AM Sylvia Davies, NT wrote: Reason for CRM: patient is calling and would like to know if Dr. Jenny Reichmann has read the fax from Dr. Kipp Laurence that was faxed on 02/10/18. She states she needs to have a xray done on the right leg while she is out of school. Please advise.

## 2018-02-16 NOTE — Telephone Encounter (Signed)
Pt calling to find out if fax was ever received and when she can have x-ray.

## 2018-02-21 ENCOUNTER — Telehealth: Payer: Self-pay | Admitting: Internal Medicine

## 2018-02-21 DIAGNOSIS — M25551 Pain in right hip: Secondary | ICD-10-CM

## 2018-02-21 NOTE — Telephone Encounter (Signed)
Patient called back asking to speak with you regarding this. Per [facility] agent, she seemed frustrated that nothing has been done yet and would like clarification on the progress.

## 2018-02-21 NOTE — Telephone Encounter (Signed)
Ok this xray is ordered, just to go to the [employer]

## 2018-02-21 NOTE — Telephone Encounter (Addendum)
Patient is calling back in regard.  States she would like to have done before she goes back to school.  Please see progress notes on 02/14/18 under media tab.  I don't know if this is what we are looking for or not.  Please call patient back today in regard.  Patient can be reached today at [phone] or [phone].  States can leave messaged on both numbers.

## 2018-02-21 NOTE — Telephone Encounter (Signed)
Of course, we should clarify -   Xray for which area? And the reason is she knows

## 2018-02-21 NOTE — Telephone Encounter (Signed)
Called pt, LVM informing her we have not received a fax for PCP to sign to proceed with xray orders.   I have reviewed the progress note in the media tab. Dr. Jenny Reichmann please advise.

## 2018-02-21 NOTE — Telephone Encounter (Signed)
Please view progress note dated 02/14/18 under the media tab. Thank you.

## 2018-02-21 NOTE — Telephone Encounter (Signed)
Spoke to pt, she stated that therapist wanted PCP to order xray to be done.   Dr. Jenny Reichmann please advise.

## 2018-02-21 NOTE — Telephone Encounter (Signed)
Pt has been informed via VM

## 2018-02-22 ENCOUNTER — Other Ambulatory Visit: Payer: Self-pay | Admitting: Family Medicine

## 2018-02-22 ENCOUNTER — Ambulatory Visit (INDEPENDENT_AMBULATORY_CARE_PROVIDER_SITE_OTHER)
Admission: RE | Admit: 2018-02-22 | Discharge: 2018-02-22 | Disposition: A | Discharge status: Home / Self Care | Payer: BC Managed Care – PPO | Source: Ambulatory Visit | Attending: Internal Medicine | Admitting: Internal Medicine

## 2018-02-22 DIAGNOSIS — M25551 Pain in right hip: Secondary | ICD-10-CM

## 2018-02-22 NOTE — Telephone Encounter (Signed)
Refill denied. Pt has completed course of treatment.  

## 2018-02-23 ENCOUNTER — Telehealth: Payer: Self-pay

## 2018-02-23 ENCOUNTER — Encounter: Payer: Self-pay | Admitting: Internal Medicine

## 2018-02-23 ENCOUNTER — Other Ambulatory Visit: Payer: Self-pay | Admitting: Internal Medicine

## 2018-02-23 DIAGNOSIS — M8700 Idiopathic aseptic necrosis of unspecified bone: Secondary | ICD-10-CM

## 2018-02-23 NOTE — Telephone Encounter (Signed)
-----   Message from Biagio Borg, MD sent at 02/23/2018 12:21 PM EDT ----- Letter sent, cont same tx except  The test results show that your current treatment is OK, except the xray shows an area of bone that may have gotten hurt.  We should refer you to orthopedic, and you should be called hopefully soon.   Shirron to please inform pt, I will do referral

## 2018-02-23 NOTE — Telephone Encounter (Signed)
Called pt, LVM.   CRM created.  

## 2018-03-20 ENCOUNTER — Encounter (INDEPENDENT_AMBULATORY_CARE_PROVIDER_SITE_OTHER): Payer: Self-pay | Admitting: Orthopaedic Surgery

## 2018-03-20 ENCOUNTER — Ambulatory Visit (INDEPENDENT_AMBULATORY_CARE_PROVIDER_SITE_OTHER): Payer: BC Managed Care – PPO | Admitting: Orthopaedic Surgery

## 2018-03-20 DIAGNOSIS — M1611 Unilateral primary osteoarthritis, right hip: Secondary | ICD-10-CM | POA: Insufficient documentation

## 2018-03-20 DIAGNOSIS — M25551 Pain in right hip: Secondary | ICD-10-CM | POA: Diagnosis not present

## 2018-03-20 NOTE — Progress Notes (Signed)
Office Visit Note   Patient: Sylvia Davies           Date of Birth: 11/01/1960           MRN: [id number] Visit Date: 03/20/2018              Requested by: Biagio Borg, MD [place], [place] [address] PCP: Biagio Borg, MD   Assessment & Plan: Visit Diagnoses:  1. Unilateral primary osteoarthritis, right hip   2. Pain of right hip joint     Plan: Given the severity of the disease in her right hip we are recommending hip replacement surgery.  At this point I am concerned about her being a fall risk given the significant deformity of her hip.  We went over x-rays in detail and talked about what hip replacement surgery involves.  We gave her handout about this as well.  We had a long thorough discussion going over x-rays.  We talked about her intraoperative and postoperative course and what surgery involves in detail.  We talked about the risk and benefits of surgery as well.  All question concerns were answered and addressed.  We will work on getting this scheduled hopefully in October with the only concerning how this relates and affects her job.  Follow-Up Instructions: Return for 2 weeks post-op.   Orders:  No orders of the defined types were placed in this encounter.  No orders of the defined types were placed in this encounter.     Procedures: No procedures performed   Clinical Data: No additional findings.   Subjective: Chief Complaint  Patient presents with  . Right Hip - Pain  The patient is a very pleasant 57 year old female with debilitating pain involving her right hip.  This is been going on for just over a year now.  She walks with a significant Trendelenburg gait coming into the office.  She does a lot of physical physical manual labor and this is gotten where it is detrimentally affecting her active daily living, her quality of life, mobility.  His range of motion is been getting less on her hip but she denies it being severe.  However she  is been in physical therapy to try to work on getting some of her motion back to her hip already.  They work with hip strengthening exercises as well.  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  She is not a smoker nondiabetic and has no significant active medical issues.  Again this is been going on for over a year now she is failed conservative treatment measures including formal outpatient physical therapy.  This pain and immobility of her right hip there is detrimental effect directives daily living, her quality of life, and her mobility.  HPI  Review of Systems She currently denies any headache, chest pain, shortness of breath, fever, chills, nausea, vomiting.  Objective: Vital Signs: LMP 07/31/2013   Physical Exam She is alert and oriented x3 and in no acute distress Ortho Exam She ambulates with a significant Trendelenburg gait.  Her right hip shows significant limitations with internal and external rotation.  She also has a leg length discrepancy with the right side being significantly shorter than the left. Specialty Comments:  No specialty comments available.  Imaging: No results found. X-rays on the canopy system showing her pelvis and right hip show severe end-stage arthritis with a touch of osteonecrosis  of the right hip.  Basically she is lost a good joint space and was completely in the femoral head is collapsing.  There is significant cystic changes in the femoral head and acetabulum.  There is very large para-articular osteophytes throughout the right hip with no joint space again remaining.  There is also significant leg length discrepancy with the right hip shorter than the left side.  Her left hip appears normal.  PMFS History: Patient Active Problem List   Diagnosis Date Noted  . Unilateral primary osteoarthritis, right hip 03/20/2018  . Arthritis of right ankle 05/17/2017  . Stress fracture of right tibia 02/08/2017  . Right  knee pain 01/18/2017  . Medication reaction 08/01/2014  . Rash and nonspecific skin eruption 01/06/2014  . Left knee pain 04/11/2013  . Hemorrhoids 12/20/2012  . Contact dermatitis 11/08/2012  . Impaired glucose tolerance 11/08/2012  . Preventative health care 06/29/2011  . LEG PAIN, BILATERAL 03/20/2009  . HYPERTENSION, BENIGN ESSENTIAL 05/04/2007  . Hyperlipidemia 04/02/2007  . OBESITY 04/02/2007  . MIGRAINE HEADACHE 04/02/2007  . MITRAL REGURGITATION, 0 (MILD) 04/02/2007  . ALLERGIC RHINITIS 04/02/2007  . GERD 04/02/2007  . Irritable bowel syndrome 04/02/2007  . LOW BACK PAIN 04/02/2007  . FIBROCYSTIC BREAST DISEASE, HX OF 04/02/2007  . BREAST BIOPSY, HX OF 04/02/2007   Past Medical History:  Diagnosis Date  . ALLERGIC RHINITIS 04/02/2007  . BREAST BIOPSY, HX OF 04/02/2007  . Diverticulosis   . FIBROCYSTIC BREAST DISEASE, HX OF 04/02/2007  . Hemorrhoids   . HYPERLIPIDEMIA 04/02/2007  . HYPERTENSION, BENIGN ESSENTIAL 05/04/2007  . Impaired glucose tolerance 11/08/2012  . Irritable bowel syndrome 04/02/2007  . MITRAL REGURGITATION, 0 (MILD) 04/02/2007  . OBESITY 04/02/2007    Family History  Problem Relation Age of Onset  . Hypertension Mother   . Diabetes Maternal Grandmother     Past Surgical History:  Procedure Laterality Date  . BREAST BIOPSY Left   . BREAST EXCISIONAL BIOPSY Left    benign  . COLONOSCOPY    . DG 5TH DIGIT LEFT FOOT Left   . DILATATION & CURETTAGE/HYSTEROSCOPY WITH MYOSURE N/A 06/02/2016   Procedure: DILATATION & CURETTAGE/HYSTEROSCOPY WITH MYOSURE;  Surgeon: Thurnell Lose, MD;  Location: [facility];  Service: Gynecology;  Laterality: N/A;  Myosure - Endometrial Mass  . EYE SURGERY Left    tear duct  . LEG SURGERY Left    have rods and srcews   Social History   Occupational History  . Occupation: Photographer: [employer]  Tobacco Use  . Smoking status: Never Smoker  . Smokeless tobacco: Never Used  Substance and  Sexual Activity  . Alcohol use: No  . Drug use: No  . Sexual activity: Not on file

## 2018-03-27 ENCOUNTER — Telehealth (INDEPENDENT_AMBULATORY_CARE_PROVIDER_SITE_OTHER): Payer: Self-pay | Admitting: Orthopaedic Surgery

## 2018-03-27 NOTE — Telephone Encounter (Signed)
Call after 9 a.m. please!

## 2018-03-27 NOTE — Telephone Encounter (Signed)
Patient would like to schedule hip replacement surgery with Dr. Ninfa Linden as soon as possible. If you will call patient on # [phone]

## 2018-03-28 NOTE — Telephone Encounter (Signed)
I called and left voice mail for return call. 

## 2018-04-18 ENCOUNTER — Other Ambulatory Visit (INDEPENDENT_AMBULATORY_CARE_PROVIDER_SITE_OTHER): Payer: Self-pay | Admitting: Physician Assistant

## 2018-04-18 ENCOUNTER — Encounter (HOSPITAL_COMMUNITY): Payer: Self-pay

## 2018-04-18 NOTE — Patient Instructions (Signed)
Sylvia Davies  04/18/2018   Your procedure is scheduled on: 04-28-18   Report to [facility] Main  Entrance  Report to admitting at    [place] AM    Call this number if you have problems the morning of surgery [phone]    Remember: Do not eat food or drink liquids :After Midnight. BRUSH YOUR TEETH MORNING OF SURGERY AND RINSE YOUR MOUTH OUT, NO CHEWING GUM CANDY OR MINTS.     Take these medicines the morning of surgery with A SIP OF WATER: amlodipine                                You may not have any metal on your body including hair pins and              piercings  Do not wear jewelry, make-up, lotions, powders or perfumes, deodorant             Do not wear nail polish.  Do not shave  48 hours prior to surgery.              Do not bring valuables to the hospital. [place].  Contacts, dentures or bridgework may not be worn into surgery.  Leave suitcase in the car. After surgery it may be brought to your room.                Please read over the following fact sheets you were given: _____________________________________________________________________         [facility] - Preparing for Surgery Before surgery, you can play an important role.  Because skin is not sterile, your skin needs to be as free of germs as possible.  You can reduce the number of germs on your skin by washing with CHG (chlorahexidine gluconate) soap before surgery.  CHG is an antiseptic cleaner which kills germs and bonds with the skin to continue killing germs even after washing. Please DO NOT use if you have an allergy to CHG or antibacterial soaps.  If your skin becomes reddened/irritated stop using the CHG and inform your nurse when you arrive at Short Stay. Do not shave (including legs and underarms) for at least 48 hours prior to the first CHG shower.  You may shave your face/neck. Please follow these instructions  carefully:  1.  Shower with CHG Soap the night before surgery and the  morning of Surgery.  2.  If you choose to wash your hair, wash your hair first as usual with your  normal  shampoo.  3.  After you shampoo, rinse your hair and body thoroughly to remove the  shampoo.                           4.  Use CHG as you would any other liquid soap.  You can apply chg directly  to the skin and wash                       Gently with a scrungie or clean washcloth.  5.  Apply the CHG Soap to your body ONLY FROM THE NECK DOWN.   Do not use on face/ open  Wound or open sores. Avoid contact with eyes, ears mouth and genitals (private parts).                       Wash face,  Genitals (private parts) with your normal soap.             6.  Wash thoroughly, paying special attention to the area where your surgery  will be performed.  7.  Thoroughly rinse your body with warm water from the neck down.  8.  DO NOT shower/wash with your normal soap after using and rinsing off  the CHG Soap.                9.  Pat yourself dry with a clean towel.            10.  Wear clean pajamas.            11.  Place clean sheets on your bed the night of your first shower and do not  sleep with pets. Day of Surgery : Do not apply any lotions/deodorants the morning of surgery.  Please wear clean clothes to the hospital/surgery center.  FAILURE TO FOLLOW THESE INSTRUCTIONS MAY RESULT IN THE CANCELLATION OF YOUR SURGERY PATIENT SIGNATURE_________________________________  NURSE SIGNATURE__________________________________  ________________________________________________________________________   Sylvia Davies  An incentive spirometer is a tool that can help keep your lungs clear and active. This tool measures how well you are filling your lungs with each breath. Taking long deep breaths may help reverse or decrease the chance of developing breathing (pulmonary) problems (especially infection)  following:  A long period of time when you are unable to move or be active. BEFORE THE PROCEDURE   If the spirometer includes an indicator to show your best effort, your nurse or respiratory therapist will set it to a desired goal.  If possible, sit up straight or lean slightly forward. Try not to slouch.  Hold the incentive spirometer in an upright position. INSTRUCTIONS FOR USE  1. Sit on the edge of your bed if possible, or sit up as far as you can in bed or on a chair. 2. Hold the incentive spirometer in an upright position. 3. Breathe out normally. 4. Place the mouthpiece in your mouth and seal your lips tightly around it. 5. Breathe in slowly and as deeply as possible, raising the piston or the ball toward the top of the column. 6. Hold your breath for 3-5 seconds or for as long as possible. Allow the piston or ball to fall to the bottom of the column. 7. Remove the mouthpiece from your mouth and breathe out normally. 8. Rest for a few seconds and repeat Steps 1 through 7 at least 10 times every 1-2 hours when you are awake. Take your time and take a few normal breaths between deep breaths. 9. The spirometer may include an indicator to show your best effort. Use the indicator as a goal to work toward during each repetition. 10. After each set of 10 deep breaths, practice coughing to be sure your lungs are clear. If you have an incision (the cut made at the time of surgery), support your incision when coughing by placing a pillow or rolled up towels firmly against it. Once you are able to get out of bed, walk around indoors and cough well. You may stop using the incentive spirometer when instructed by your caregiver.  RISKS AND COMPLICATIONS  Take your time so you do not get  dizzy or light-headed.  If you are in pain, you may need to take or ask for pain medication before doing incentive spirometry. It is harder to take a deep breath if you are having pain. AFTER USE  Rest and  breathe slowly and easily.  It can be helpful to keep track of a log of your progress. Your caregiver can provide you with a simple table to help with this. If you are using the spirometer at home, follow these instructions: [place] IF:   You are having difficultly using the spirometer.  You have trouble using the spirometer as often as instructed.  Your pain medication is not giving enough relief while using the spirometer.  You develop fever of 100.5 F (38.1 C) or higher. SEEK IMMEDIATE MEDICAL CARE IF:   You cough up bloody sputum that had not been present before.  You develop fever of 102 F (38.9 C) or greater.  You develop worsening pain at or near the incision site. MAKE SURE YOU:   Understand these instructions.  Will watch your condition.  Will get help right away if you are not doing well or get worse. Document Released: 11/08/2006 Document Revised: 09/20/2011 Document Reviewed: 01/09/2007 [facility] Patient Information 2014 [place], [place].   ________________________________________________________________________

## 2018-04-19 ENCOUNTER — Encounter (HOSPITAL_COMMUNITY): Payer: Self-pay

## 2018-04-19 ENCOUNTER — Encounter (HOSPITAL_COMMUNITY)
Admission: RE | Admit: 2018-04-19 | Discharge: 2018-04-19 | Disposition: A | Discharge status: Home / Self Care | Payer: BC Managed Care – PPO | Source: Ambulatory Visit | Attending: Orthopaedic Surgery | Admitting: Orthopaedic Surgery

## 2018-04-19 ENCOUNTER — Other Ambulatory Visit (INDEPENDENT_AMBULATORY_CARE_PROVIDER_SITE_OTHER): Payer: Self-pay

## 2018-04-19 ENCOUNTER — Other Ambulatory Visit: Payer: Self-pay

## 2018-04-19 DIAGNOSIS — M160 Bilateral primary osteoarthritis of hip: Secondary | ICD-10-CM | POA: Insufficient documentation

## 2018-04-19 DIAGNOSIS — Z01818 Encounter for other preprocedural examination: Secondary | ICD-10-CM | POA: Insufficient documentation

## 2018-04-19 HISTORY — DX: Unspecified osteoarthritis, unspecified site: M19.90

## 2018-04-19 LAB — CBC
HEMATOCRIT: 40.7 % (ref 36.0–46.0)
HEMOGLOBIN: 13 g/dL (ref 12.0–15.0)
MCH: 26.6 pg (ref 26.0–34.0)
MCHC: 31.9 g/dL (ref 30.0–36.0)
MCV: 83.2 fL (ref 80.0–100.0)
NRBC: 0 % (ref 0.0–0.2)
Platelets: 238 10*3/uL (ref 150–400)
RBC: 4.89 MIL/uL (ref 3.87–5.11)
RDW: 14.4 % (ref 11.5–15.5)
WBC: 5.1 10*3/uL (ref 4.0–10.5)

## 2018-04-19 LAB — BASIC METABOLIC PANEL
ANION GAP: 10 (ref 5–15)
BUN: 8 mg/dL (ref 6–20)
CHLORIDE: 102 mmol/L (ref 98–111)
CO2: 28 mmol/L (ref 22–32)
Calcium: 9.3 mg/dL (ref 8.9–10.3)
Creatinine, Ser: 0.59 mg/dL (ref 0.44–1.00)
Glucose, Bld: 91 mg/dL (ref 70–99)
POTASSIUM: 3.5 mmol/L (ref 3.5–5.1)
Sodium: 140 mmol/L (ref 135–145)

## 2018-04-19 LAB — SURGICAL PCR SCREEN
MRSA, PCR: NEGATIVE
Staphylococcus aureus: NEGATIVE

## 2018-04-27 NOTE — Anesthesia Preprocedure Evaluation (Addendum)
Anesthesia Evaluation    Reviewed: Allergy & Precautions, Patient's Chart, lab work & pertinent test results  Airway Mallampati: III  TM Distance: >3 FB Neck ROM: Full    Dental no notable dental hx.    Pulmonary neg pulmonary ROS,    Pulmonary exam normal breath sounds clear to auscultation       Cardiovascular hypertension, Pt. on medications Normal cardiovascular exam+ Valvular Problems/Murmurs MR  Rhythm:Regular Rate:Normal  ECG: NSR, rate 60   Neuro/Psych  Headaches,    GI/Hepatic Neg liver ROS, Irritable bowel syndrome   Endo/Other  negative endocrine ROS  Renal/GU negative Renal ROS     Musculoskeletal  (+) Arthritis , Osteoarthritis,    Abdominal (+) + obese,   Peds  Hematology HLD   Anesthesia Other Findings Day of surgery medications reviewed with the patient.  Reproductive/Obstetrics                           Anesthesia Physical Anesthesia Plan  ASA: II  Anesthesia Plan: Spinal   Post-op Pain Management:    Induction: Intravenous  PONV Risk Score and Plan: 2 and Propofol infusion, Treatment may vary due to age or medical condition, Ondansetron, Dexamethasone and Midazolam  Airway Management Planned: Natural Airway  Additional Equipment:   Intra-op Plan:   Post-operative Plan:   Informed Consent: I have reviewed the patients History and Physical, chart, labs and discussed the procedure including the risks, benefits and alternatives for the proposed anesthesia with the patient or authorized representative who has indicated his/her understanding and acceptance.   Dental advisory given  Plan Discussed with: CRNA  Anesthesia Plan Comments:         Anesthesia Quick Evaluation

## 2018-04-28 ENCOUNTER — Inpatient Hospital Stay (HOSPITAL_COMMUNITY): Payer: BC Managed Care – PPO | Admitting: Anesthesiology

## 2018-04-28 ENCOUNTER — Inpatient Hospital Stay (HOSPITAL_COMMUNITY): Payer: BC Managed Care – PPO

## 2018-04-28 ENCOUNTER — Inpatient Hospital Stay (HOSPITAL_COMMUNITY)
Admission: RE | Admit: 2018-04-28 | Discharge: 2018-04-30 | DRG: 470 | Disposition: A | Discharge status: Home Health | Payer: BC Managed Care – PPO | Attending: Orthopaedic Surgery | Admitting: Orthopaedic Surgery

## 2018-04-28 ENCOUNTER — Other Ambulatory Visit: Payer: Self-pay

## 2018-04-28 ENCOUNTER — Encounter (HOSPITAL_COMMUNITY): Payer: Self-pay | Admitting: *Deleted

## 2018-04-28 ENCOUNTER — Encounter (HOSPITAL_COMMUNITY)
Admission: RE | Disposition: A | Discharge status: Home Health | Payer: Self-pay | Source: Home / Self Care | Attending: Orthopaedic Surgery

## 2018-04-28 DIAGNOSIS — Z888 Allergy status to other drugs, medicaments and biological substances status: Secondary | ICD-10-CM | POA: Diagnosis not present

## 2018-04-28 DIAGNOSIS — E669 Obesity, unspecified: Secondary | ICD-10-CM | POA: Diagnosis present

## 2018-04-28 DIAGNOSIS — I340 Nonrheumatic mitral (valve) insufficiency: Secondary | ICD-10-CM | POA: Diagnosis present

## 2018-04-28 DIAGNOSIS — M1611 Unilateral primary osteoarthritis, right hip: Secondary | ICD-10-CM

## 2018-04-28 DIAGNOSIS — Z885 Allergy status to narcotic agent status: Secondary | ICD-10-CM

## 2018-04-28 DIAGNOSIS — Z8249 Family history of ischemic heart disease and other diseases of the circulatory system: Secondary | ICD-10-CM | POA: Diagnosis not present

## 2018-04-28 DIAGNOSIS — Z96641 Presence of right artificial hip joint: Secondary | ICD-10-CM

## 2018-04-28 DIAGNOSIS — Z880 Allergy status to penicillin: Secondary | ICD-10-CM | POA: Diagnosis not present

## 2018-04-28 DIAGNOSIS — K219 Gastro-esophageal reflux disease without esophagitis: Secondary | ICD-10-CM | POA: Diagnosis present

## 2018-04-28 DIAGNOSIS — M25551 Pain in right hip: Secondary | ICD-10-CM | POA: Diagnosis present

## 2018-04-28 DIAGNOSIS — M25751 Osteophyte, right hip: Secondary | ICD-10-CM | POA: Diagnosis present

## 2018-04-28 DIAGNOSIS — E785 Hyperlipidemia, unspecified: Secondary | ICD-10-CM | POA: Diagnosis present

## 2018-04-28 DIAGNOSIS — Z6832 Body mass index (BMI) 32.0-32.9, adult: Secondary | ICD-10-CM | POA: Diagnosis not present

## 2018-04-28 DIAGNOSIS — Z881 Allergy status to other antibiotic agents status: Secondary | ICD-10-CM

## 2018-04-28 DIAGNOSIS — M19071 Primary osteoarthritis, right ankle and foot: Secondary | ICD-10-CM | POA: Diagnosis present

## 2018-04-28 DIAGNOSIS — K589 Irritable bowel syndrome without diarrhea: Secondary | ICD-10-CM | POA: Diagnosis present

## 2018-04-28 DIAGNOSIS — I10 Essential (primary) hypertension: Secondary | ICD-10-CM | POA: Diagnosis present

## 2018-04-28 DIAGNOSIS — M25451 Effusion, right hip: Secondary | ICD-10-CM | POA: Diagnosis present

## 2018-04-28 DIAGNOSIS — Z419 Encounter for procedure for purposes other than remedying health state, unspecified: Secondary | ICD-10-CM

## 2018-04-28 HISTORY — PX: TOTAL HIP ARTHROPLASTY: SHX124

## 2018-04-28 SURGERY — ARTHROPLASTY, HIP, TOTAL, ANTERIOR APPROACH
Anesthesia: Spinal | Site: Hip | Laterality: Right

## 2018-04-28 MED ORDER — PROPOFOL 10 MG/ML IV BOLUS
INTRAVENOUS | Status: AC
  Filled 2018-04-28: qty 20

## 2018-04-28 MED ORDER — DEXAMETHASONE SODIUM PHOSPHATE 10 MG/ML IJ SOLN
INTRAMUSCULAR | Status: AC
  Filled 2018-04-28: qty 1

## 2018-04-28 MED ORDER — ONDANSETRON HCL 4 MG/2ML IJ SOLN: 4.0000 mg | Freq: Once | INTRAMUSCULAR | Status: DC | PRN

## 2018-04-28 MED ORDER — BUPIVACAINE IN DEXTROSE 0.75-8.25 % IT SOLN
INTRATHECAL | Status: DC | PRN
  Administered 2018-04-28: 1.8 mL via INTRATHECAL

## 2018-04-28 MED ORDER — TRANEXAMIC ACID-NACL 1000-0.7 MG/100ML-% IV SOLN
1000.0000 mg | INTRAVENOUS | Status: AC
  Administered 2018-04-28: 1000 mg via INTRAVENOUS
  Filled 2018-04-28: qty 100

## 2018-04-28 MED ORDER — OXYCODONE HCL 5 MG PO TABS
5.0000 mg | ORAL_TABLET | ORAL | Status: DC | PRN
  Filled 2018-04-28: qty 1

## 2018-04-28 MED ORDER — OXYCODONE HCL 5 MG PO TABS
10.0000 mg | ORAL_TABLET | ORAL | Status: DC | PRN
  Filled 2018-04-28: qty 3

## 2018-04-28 MED ORDER — FENTANYL CITRATE (PF) 100 MCG/2ML IJ SOLN
INTRAMUSCULAR | Status: AC
  Filled 2018-04-28: qty 2

## 2018-04-28 MED ORDER — FENTANYL CITRATE (PF) 100 MCG/2ML IJ SOLN: 25.0000 ug | INTRAMUSCULAR | Status: DC | PRN

## 2018-04-28 MED ORDER — MIDAZOLAM HCL 5 MG/5ML IJ SOLN
INTRAMUSCULAR | Status: DC | PRN
  Administered 2018-04-28: 2 mg via INTRAVENOUS

## 2018-04-28 MED ORDER — PROPOFOL 500 MG/50ML IV EMUL
INTRAVENOUS | Status: DC | PRN
  Administered 2018-04-28: 100 ug/kg/min via INTRAVENOUS

## 2018-04-28 MED ORDER — AMLODIPINE BESYLATE 5 MG PO TABS
5.0000 mg | ORAL_TABLET | Freq: Every day | ORAL | Status: DC
  Administered 2018-04-29 – 2018-04-30 (×2): 5 mg via ORAL
  Filled 2018-04-28 (×2): qty 1

## 2018-04-28 MED ORDER — METHOCARBAMOL 500 MG IVPB - SIMPLE MED
INTRAVENOUS | Status: AC
  Filled 2018-04-28: qty 50

## 2018-04-28 MED ORDER — ATORVASTATIN CALCIUM 10 MG PO TABS
10.0000 mg | ORAL_TABLET | Freq: Every day | ORAL | Status: DC
  Administered 2018-04-29: 10 mg via ORAL
  Filled 2018-04-28 (×2): qty 1

## 2018-04-28 MED ORDER — SODIUM CHLORIDE 0.9 % IV SOLN
INTRAVENOUS | Status: DC
  Administered 2018-04-28 – 2018-04-29 (×2): via INTRAVENOUS

## 2018-04-28 MED ORDER — METOCLOPRAMIDE HCL 5 MG/ML IJ SOLN
5.0000 mg | Freq: Three times a day (TID) | INTRAMUSCULAR | Status: DC | PRN
  Administered 2018-04-28: 5 mg via INTRAVENOUS
  Filled 2018-04-28: qty 2

## 2018-04-28 MED ORDER — ONDANSETRON HCL 4 MG PO TABS
4.0000 mg | ORAL_TABLET | Freq: Four times a day (QID) | ORAL | Status: DC | PRN
  Filled 2018-04-28: qty 1

## 2018-04-28 MED ORDER — VANCOMYCIN HCL IN DEXTROSE 1-5 GM/200ML-% IV SOLN
1000.0000 mg | INTRAVENOUS | Status: AC
  Administered 2018-04-28: 1000 mg via INTRAVENOUS
  Filled 2018-04-28: qty 200

## 2018-04-28 MED ORDER — DEXAMETHASONE SODIUM PHOSPHATE 10 MG/ML IJ SOLN
INTRAMUSCULAR | Status: DC | PRN
  Administered 2018-04-28: 10 mg via INTRAVENOUS

## 2018-04-28 MED ORDER — SODIUM CHLORIDE 0.9 % IR SOLN
Status: DC | PRN
  Administered 2018-04-28: 1000 mL

## 2018-04-28 MED ORDER — PHENOL 1.4 % MT LIQD
1.0000 | OROMUCOSAL | Status: DC | PRN
  Filled 2018-04-28: qty 177

## 2018-04-28 MED ORDER — METHOCARBAMOL 500 MG IVPB - SIMPLE MED
500.0000 mg | Freq: Four times a day (QID) | INTRAVENOUS | Status: DC | PRN
  Administered 2018-04-28: 500 mg via INTRAVENOUS
  Filled 2018-04-28 (×2): qty 50

## 2018-04-28 MED ORDER — DIPHENHYDRAMINE HCL 12.5 MG/5ML PO ELIX: 12.5000 mg | ORAL_SOLUTION | ORAL | Status: DC | PRN

## 2018-04-28 MED ORDER — ONDANSETRON HCL 4 MG/2ML IJ SOLN
4.0000 mg | Freq: Four times a day (QID) | INTRAMUSCULAR | Status: DC | PRN
  Administered 2018-04-28 – 2018-04-29 (×3): 4 mg via INTRAVENOUS
  Filled 2018-04-28 (×3): qty 2

## 2018-04-28 MED ORDER — EPHEDRINE 5 MG/ML INJ
INTRAVENOUS | Status: AC
  Filled 2018-04-28: qty 10

## 2018-04-28 MED ORDER — PHENYLEPHRINE 40 MCG/ML (10ML) SYRINGE FOR IV PUSH (FOR BLOOD PRESSURE SUPPORT)
PREFILLED_SYRINGE | INTRAVENOUS | Status: AC
  Filled 2018-04-28: qty 10

## 2018-04-28 MED ORDER — METOCLOPRAMIDE HCL 5 MG PO TABS: 5.0000 mg | ORAL_TABLET | Freq: Three times a day (TID) | ORAL | Status: DC | PRN

## 2018-04-28 MED ORDER — FENTANYL CITRATE (PF) 100 MCG/2ML IJ SOLN
INTRAMUSCULAR | Status: DC | PRN
  Administered 2018-04-28: 100 ug via INTRAVENOUS

## 2018-04-28 MED ORDER — PHENYLEPHRINE 40 MCG/ML (10ML) SYRINGE FOR IV PUSH (FOR BLOOD PRESSURE SUPPORT)
PREFILLED_SYRINGE | INTRAVENOUS | Status: DC | PRN
  Administered 2018-04-28 (×5): 80 ug via INTRAVENOUS

## 2018-04-28 MED ORDER — ACETAMINOPHEN 325 MG PO TABS
325.0000 mg | ORAL_TABLET | Freq: Four times a day (QID) | ORAL | Status: DC | PRN
  Administered 2018-04-30: 650 mg via ORAL
  Filled 2018-04-28 (×3): qty 2

## 2018-04-28 MED ORDER — POLYETHYLENE GLYCOL 3350 17 G PO PACK: 17.0000 g | PACK | Freq: Every day | ORAL | Status: DC | PRN

## 2018-04-28 MED ORDER — ONDANSETRON HCL 4 MG/2ML IJ SOLN
INTRAMUSCULAR | Status: DC | PRN
  Administered 2018-04-28: 4 mg via INTRAVENOUS

## 2018-04-28 MED ORDER — PROPOFOL 10 MG/ML IV BOLUS
INTRAVENOUS | Status: AC
  Filled 2018-04-28: qty 40

## 2018-04-28 MED ORDER — VANCOMYCIN HCL IN DEXTROSE 1-5 GM/200ML-% IV SOLN
1000.0000 mg | Freq: Two times a day (BID) | INTRAVENOUS | Status: AC
  Administered 2018-04-28: 1000 mg via INTRAVENOUS
  Filled 2018-04-28: qty 200

## 2018-04-28 MED ORDER — BISACODYL 10 MG RE SUPP: 10.0000 mg | Freq: Every day | RECTAL | Status: DC | PRN

## 2018-04-28 MED ORDER — CHLORHEXIDINE GLUCONATE 4 % EX LIQD: 60.0000 mL | Freq: Once | CUTANEOUS | Status: DC

## 2018-04-28 MED ORDER — MENTHOL 3 MG MT LOZG
1.0000 | LOZENGE | OROMUCOSAL | Status: DC | PRN
  Administered 2018-04-30: 3 mg via ORAL
  Filled 2018-04-28: qty 9

## 2018-04-28 MED ORDER — METHOCARBAMOL 500 MG PO TABS
500.0000 mg | ORAL_TABLET | Freq: Four times a day (QID) | ORAL | Status: DC | PRN
  Administered 2018-04-29: 500 mg via ORAL
  Filled 2018-04-28 (×2): qty 1

## 2018-04-28 MED ORDER — HYDROMORPHONE HCL 1 MG/ML IJ SOLN
0.5000 mg | INTRAMUSCULAR | Status: DC | PRN
  Administered 2018-04-28 – 2018-04-29 (×3): 0.5 mg via INTRAVENOUS
  Filled 2018-04-28 (×3): qty 1

## 2018-04-28 MED ORDER — PANTOPRAZOLE SODIUM 40 MG PO TBEC
40.0000 mg | DELAYED_RELEASE_TABLET | Freq: Every day | ORAL | Status: DC
  Administered 2018-04-29 – 2018-04-30 (×2): 40 mg via ORAL
  Filled 2018-04-28 (×3): qty 1

## 2018-04-28 MED ORDER — EPHEDRINE SULFATE-NACL 50-0.9 MG/10ML-% IV SOSY
PREFILLED_SYRINGE | INTRAVENOUS | Status: DC | PRN
  Administered 2018-04-28: 10 mg via INTRAVENOUS
  Administered 2018-04-28 (×3): 5 mg via INTRAVENOUS

## 2018-04-28 MED ORDER — DOCUSATE SODIUM 100 MG PO CAPS
100.0000 mg | ORAL_CAPSULE | Freq: Two times a day (BID) | ORAL | Status: DC
  Administered 2018-04-28 – 2018-04-30 (×4): 100 mg via ORAL
  Filled 2018-04-28 (×4): qty 1

## 2018-04-28 MED ORDER — ASPIRIN 81 MG PO CHEW
81.0000 mg | CHEWABLE_TABLET | Freq: Two times a day (BID) | ORAL | Status: DC
  Administered 2018-04-28 – 2018-04-30 (×4): 81 mg via ORAL
  Filled 2018-04-28 (×4): qty 1

## 2018-04-28 MED ORDER — TRAMADOL HCL 50 MG PO TABS
50.0000 mg | ORAL_TABLET | Freq: Four times a day (QID) | ORAL | Status: DC | PRN
  Administered 2018-04-29 – 2018-04-30 (×2): 50 mg via ORAL
  Filled 2018-04-28 (×2): qty 1

## 2018-04-28 MED ORDER — ONDANSETRON HCL 4 MG/2ML IJ SOLN
INTRAMUSCULAR | Status: AC
  Filled 2018-04-28: qty 2

## 2018-04-28 MED ORDER — ALUM & MAG HYDROXIDE-SIMETH 200-200-20 MG/5ML PO SUSP: 30.0000 mL | ORAL | Status: DC | PRN

## 2018-04-28 MED ORDER — MIDAZOLAM HCL 2 MG/2ML IJ SOLN
INTRAMUSCULAR | Status: AC
  Filled 2018-04-28: qty 2

## 2018-04-28 MED ORDER — 0.9 % SODIUM CHLORIDE (POUR BTL) OPTIME
TOPICAL | Status: DC | PRN
  Administered 2018-04-28: 1000 mL

## 2018-04-28 MED ORDER — LACTATED RINGERS IV SOLN
INTRAVENOUS | Status: DC
  Administered 2018-04-28 (×2): via INTRAVENOUS

## 2018-04-28 SURGICAL SUPPLY — 44 items
APL SKNCLS STERI-STRIP NONHPOA (GAUZE/BANDAGES/DRESSINGS)
BAG SPEC THK2 15X12 ZIP CLS (MISCELLANEOUS) ×1
BAG ZIPLOCK 12X15 (MISCELLANEOUS) ×2 IMPLANT
BENZOIN TINCTURE PRP APPL 2/3 (GAUZE/BANDAGES/DRESSINGS) IMPLANT
BLADE SAW SGTL 18X1.27X75 (BLADE) ×2 IMPLANT
BLADE SAW SGTL 18X1.27X75MM (BLADE) ×1
CLOSURE STERI-STRIP 1/2X4 (GAUZE/BANDAGES/DRESSINGS)
CLOSURE WOUND 1/2 X4 (GAUZE/BANDAGES/DRESSINGS)
CLSR STERI-STRIP ANTIMIC 1/2X4 (GAUZE/BANDAGES/DRESSINGS) IMPLANT
COVER PERINEAL POST (MISCELLANEOUS) ×3 IMPLANT
COVER SURGICAL LIGHT HANDLE (MISCELLANEOUS) ×3 IMPLANT
COVER WAND RF STERILE (DRAPES) IMPLANT
DRAPE STERI IOBAN 125X83 (DRAPES) ×3 IMPLANT
DRAPE U-SHAPE 47X51 STRL (DRAPES) ×6 IMPLANT
DRSG AQUACEL AG ADV 3.5X10 (GAUZE/BANDAGES/DRESSINGS) ×3 IMPLANT
DURAPREP 26ML APPLICATOR (WOUND CARE) ×3 IMPLANT
ELECT REM PT RETURN 15FT ADLT (MISCELLANEOUS) ×3 IMPLANT
GAUZE XEROFORM 1X8 LF (GAUZE/BANDAGES/DRESSINGS) ×2 IMPLANT
GLOVE BIO SURGEON STRL SZ7.5 (GLOVE) ×3 IMPLANT
GLOVE BIOGEL PI IND STRL 8 (GLOVE) ×2 IMPLANT
GLOVE BIOGEL PI INDICATOR 8 (GLOVE) ×4
GLOVE ECLIPSE 8.0 STRL XLNG CF (GLOVE) ×3 IMPLANT
GOWN STRL REUS W/TWL XL LVL3 (GOWN DISPOSABLE) ×6 IMPLANT
HANDPIECE INTERPULSE COAX TIP (DISPOSABLE) ×3
HEAD M SROM 36MM 2 (Hips) IMPLANT
HOLDER FOLEY CATH W/STRAP (MISCELLANEOUS) ×3 IMPLANT
LINER ACETAB NEUTRAL 36ID 520D (Liner) ×2 IMPLANT
PACK ANTERIOR HIP CUSTOM (KITS) ×3 IMPLANT
PIN SECTOR W/GRIP ACE CUP 52MM (Hips) ×2 IMPLANT
SCREW 6.5MMX25MM (Screw) ×2 IMPLANT
SET HNDPC FAN SPRY TIP SCT (DISPOSABLE) ×1 IMPLANT
SROM M HEAD 36MM 2 (Hips) ×3 IMPLANT
STAPLER VISISTAT 35W (STAPLE) ×2 IMPLANT
STEM CORAIL KA12 (Stem) ×2 IMPLANT
STRIP CLOSURE SKIN 1/2X4 (GAUZE/BANDAGES/DRESSINGS) IMPLANT
SUT ETHIBOND NAB CT1 #1 30IN (SUTURE) ×3 IMPLANT
SUT MNCRL AB 4-0 PS2 18 (SUTURE) IMPLANT
SUT VIC AB 0 CT1 36 (SUTURE) ×3 IMPLANT
SUT VIC AB 1 CT1 36 (SUTURE) ×3 IMPLANT
SUT VIC AB 2-0 CT1 27 (SUTURE) ×6
SUT VIC AB 2-0 CT1 TAPERPNT 27 (SUTURE) ×2 IMPLANT
TRAY FOLEY CATH 14FR (SET/KITS/TRAYS/PACK) ×2 IMPLANT
TRAY FOLEY MTR SLVR 16FR STAT (SET/KITS/TRAYS/PACK) ×1 IMPLANT
YANKAUER SUCT BULB TIP 10FT TU (MISCELLANEOUS) ×3 IMPLANT

## 2018-04-28 NOTE — Anesthesia Postprocedure Evaluation (Signed)
Anesthesia Post Note  Patient: Sylvia Davies  Procedure(s) Performed: RIGHT TOTAL HIP ARTHROPLASTY ANTERIOR APPROACH (Right Hip)     Patient location during evaluation: PACU Anesthesia Type: Spinal Level of consciousness: oriented and awake and alert Pain management: pain level controlled Vital Signs Assessment: post-procedure vital signs reviewed and stable Respiratory status: spontaneous breathing, respiratory function stable and patient connected to nasal cannula oxygen Cardiovascular status: blood pressure returned to baseline and stable Postop Assessment: no headache, no backache, no apparent nausea or vomiting and spinal receding Anesthetic complications: no    Last Vitals:  Vitals:   04/28/18 1448 04/28/18 1814  BP: 115/71 133/76  Pulse: 85 90  Resp:    Temp: (!) 36.4 C 36.9 C  SpO2: 100% 98%    Last Pain:  Vitals:   04/28/18 1814  TempSrc: Oral  PainSc:                  Ryan P Ellender

## 2018-04-28 NOTE — Op Note (Signed)
NAME: Sylvia Davies, Sylvia Davies. 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  PROCEDURE:  Right total hip arthroplasty through direct anterior approach.  IMPLANTS:  DePuy Sector Gription acetabular component size 52, size 36+0 neutral polyethylene liner, a single screw as well, size 12 Corail femoral component with standard offset, size 36 -2 metal hip ball.  SURGEON:  Lind Guest. Ninfa Linden, MD  ASSISTANT:  Erskine Emery, PA-C.  ANESTHESIA:  Spinal.  ANTIBIOTICS:  One gram IV vancomycin.  ESTIMATED BLOOD LOSS:  200 mL.  COMPLICATIONS:  None.  INDICATIONS:  The patient is a very pleasant 57 year old female with debilitating arthritis involving her right hip.  Her pain is daily and has detrimentally affected activities of daily living, her quality of life and mobility.  Her x-rays show almost  subluxation of her right hip.  At this point, with severe disease throughout the hip itself.  I have talked to her about total hip arthroplasty surgery.  We had a long and thorough discussion about the surgery involved.  We talked about the goals being  decreased pain, improve mobility and overall improve quality of life.  We talked about the risk of acute blood loss anemia, nerve or vessel injury, fracture, infection, dislocation, DVT and implant failure.  DESCRIPTION OF PROCEDURE:  After informed consent was obtained for right hip was marked.  She was brought to the operating room and sat up on a stretcher where spinal anesthesia was obtained.  She was then laid in supine position.  A Foley catheter was  placed and we were able to really assess her leg  length showed that she is shorter on her operative right side.  Traction boots were then placed on both her feet.  She was placed supine on the Hana fracture table with a perineal post in place,  both legs  in line with skeletal traction device and no traction applied.  Her right operative hip was prepped and draped with DuraPrep and sterile drapes.  A time-out was called.  She was identified, correct patient, correct right hip.  We then made an incision  just inferior and posterior to the anterior superior iliac spine and carried this obliquely down the leg.  We dissected down tensor fascia lata muscle.  Tensor fascia was then divided longitudinally to proceed with direct anterior approach to the hip.   We dissected down to the hip capsule.   Hemostasis obtained with electrocautery, cauterizing lateral circumflex vessels.  A Cobra retractor was then placed around the medial and lateral femoral neck.  We opened up our joint capsule and found a very large  joint effusion and significant arthritis around her right hip.  I placed Cobra retractors around the medial and lateral femoral neck and we made our femoral neck cut just proximal to the lesser trochanter with an oscillating saw and completed this with  an osteotome.  We placed a corkscrew down the femoral head and removed the femoral head in its entirety and found it to be devoid of cartilage.  I then cleaned the acetabular rim of the acetabular labrum and other debris and periarticular osteophytes.   We placed a bent Hohmann over the medial acetabular rim and then began reaming under direct visualization from a size 44 reamer,  going up to a size 51 with all reamers under direct visualization.  The last reamer under direct fluoroscopy, so we could  obtain our depth of reaming our inclination and anteversion.  I then placed the real DePuy Sector Gription acetabular component size 52 and a single screw and a 36+0 polyethylene liner for that size 52 acetabular  component.  Attention was then turned to  the femur.  With the leg externally rotated to 120 degrees, extended and adducted, we are to place a Mueller retractor medially and a Hohmann retractor behind the greater trochanter, released lateral joint capsule and used a box-cutting osteotome to  enter femoral canal and a rongeur to lateralize then began broaching from a size 8 broach using Corail broaching system going up to a size 12.  With a size 12 in place, we trialed a standard offset femoral neck and we went with a 36-2 hip ball since we  brought her hip down further with the acetabular placement.  We reduced this in the acetabulum.  We were pleased with the leg length, offset, range of motion and stability.  We dislocated the hip and removed the trial components.  We then placed the real  Corail femoral component size 12 with standard offset and the real 36 -2 metal hip ball, again reduced the acetabulum.  We felt it was stable.  We then irrigated the soft tissue with normal saline solution using pulsatile lavage.  We verified placement  of the implant under fluoroscopy.  We then closed the joint capsule with interrupted #1 Ethibond suture, followed by running 0 Vicryl and tensor fascia, 0 Vicryl in the deep tissue, 2-0 Vicryl subcutaneous tissue and interrupted staples on the skin.   Xeroform and a well-padded sterile dressing was dry Aquacel dressing was applied.    She was then taken off the Hana table and taken to recovery room in stable condition.  All final counts were correct.  There were no complications noted.    Of note, Benita Stabile, PA-C, assisted the entire case and assistance was crucial for facilitating all aspects of this case.  AN/NUANCE  D:04/28/2018 T:04/28/2018 JOB:[id number]

## 2018-04-28 NOTE — H&P (Signed)
TOTAL HIP ADMISSION H&P  Patient is admitted for right total hip arthroplasty.  Subjective:  Chief Complaint: right hip pain  HPI: Sylvia Davies, 57 y.o. female, has a history of pain and functional disability in the right hip(s) due to arthritis and patient has failed non-surgical conservative treatments for greater than 12 weeks to include NSAID's and/or analgesics, corticosteriod injections, flexibility and strengthening excercises, use of assistive devices, weight reduction as appropriate and activity modification.  Onset of symptoms was gradual starting 2 years ago with gradually worsening course since that time.The patient noted no past surgery on the right hip(s).  Patient currently rates pain in the right hip at 10 out of 10 with activity. Patient has night pain, worsening of pain with activity and weight bearing, trendelenberg gait, pain that interfers with activities of daily living, pain with passive range of motion and crepitus. Patient has evidence of subchondral cysts, subchondral sclerosis, periarticular osteophytes and joint space narrowing by imaging studies. This condition presents safety issues increasing the risk of falls.  There is no current active infection.  Patient Active Problem List   Diagnosis Date Noted  . Unilateral primary osteoarthritis, right hip 03/20/2018  . Arthritis of right ankle 05/17/2017  . Stress fracture of right tibia 02/08/2017  . Right knee pain 01/18/2017  . Medication reaction 08/01/2014  . Rash and nonspecific skin eruption 01/06/2014  . Left knee pain 04/11/2013  . Hemorrhoids 12/20/2012  . Contact dermatitis 11/08/2012  . Impaired glucose tolerance 11/08/2012  . Preventative health care 06/29/2011  . LEG PAIN, BILATERAL 03/20/2009  . HYPERTENSION, BENIGN ESSENTIAL 05/04/2007  . Hyperlipidemia 04/02/2007  . OBESITY 04/02/2007  . MIGRAINE HEADACHE 04/02/2007  . MITRAL REGURGITATION, 0 (MILD) 04/02/2007  . ALLERGIC RHINITIS 04/02/2007   . GERD 04/02/2007  . Irritable bowel syndrome 04/02/2007  . LOW BACK PAIN 04/02/2007  . FIBROCYSTIC BREAST DISEASE, HX OF 04/02/2007  . BREAST BIOPSY, HX OF 04/02/2007   Past Medical History:  Diagnosis Date  . ALLERGIC RHINITIS 04/02/2007  . Arthritis   . BREAST BIOPSY, HX OF 04/02/2007  . Diverticulosis   . FIBROCYSTIC BREAST DISEASE, HX OF 04/02/2007  . Hemorrhoids   . HYPERLIPIDEMIA 04/02/2007  . HYPERTENSION, BENIGN ESSENTIAL 05/04/2007  . Impaired glucose tolerance 11/08/2012  . Irritable bowel syndrome 04/02/2007  . MITRAL REGURGITATION, 0 (MILD) 04/02/2007  . OBESITY 04/02/2007    Past Surgical History:  Procedure Laterality Date  . BREAST BIOPSY Left   . BREAST EXCISIONAL BIOPSY Left    benign  . COLONOSCOPY    . DG 5TH DIGIT LEFT FOOT Left   . DILATATION & CURETTAGE/HYSTEROSCOPY WITH MYOSURE N/A 06/02/2016   Procedure: DILATATION & CURETTAGE/HYSTEROSCOPY WITH MYOSURE;  Surgeon: Thurnell Lose, MD;  Location: [facility];  Service: Gynecology;  Laterality: N/A;  Myosure - Endometrial Mass  . EYE SURGERY Left    tear duct  . JOINT REPLACEMENT     Right total hip Dr. Felicie Morn 04-28-18  . LEG SURGERY Left    have rods and srcews    Current Facility-Administered Medications  Medication Dose Route Frequency Provider Last Rate Last Dose  . chlorhexidine (HIBICLENS) 4 % liquid 4 application  60 mL Topical Once Erskine Emery W, PA-C      . lactated ringers infusion   Intravenous Continuous Ellender, Karyl Kinnier, MD      . tranexamic acid (CYKLOKAPRON) IVPB 1,000 mg  1,000 mg Intravenous To OR Pete Pelt, PA-C      . vancomycin (VANCOCIN) IVPB  1000 mg/200 mL premix  1,000 mg Intravenous On Call to OR Pete Pelt, PA-C       Allergies  Allergen Reactions  . Amlodipine Besy-Benazepril Hcl Itching    REACTION:sore throat  . Cleocin [Clindamycin Hcl] Itching  . Hyoscyamine   . Penicillins     Has patient had a PCN reaction causing immediate rash, facial/tongue/throat  swelling, SOB or lightheadedness with hypotension: yes Has patient had a PCN reaction causing severe rash involving mucus membranes or skin necrosis: no Has patient had a PCN reaction that required hospitalization no Has patient had a PCN reaction occurring within the last 10 years: yes If all of the above answers are "NO", then may proceed with Cephalosporin use.   . Tramadol Hives  . Zylet [Loteprednol-Tobramycin] Itching    Social History   Tobacco Use  . Smoking status: Never Smoker  . Smokeless tobacco: Never Used  Substance Use Topics  . Alcohol use: No    Family History  Problem Relation Age of Onset  . Hypertension Mother   . Diabetes Maternal Grandmother      Review of Systems  Musculoskeletal: Positive for joint pain.  All other systems reviewed and are negative.   Objective:  Physical Exam  Constitutional: She is oriented to person, place, and time. She appears well-developed and well-nourished.  HENT:  Head: Normocephalic and atraumatic.  Eyes: Pupils are equal, round, and reactive to light. EOM are normal.  Neck: Normal range of motion. Neck supple.  Cardiovascular: Normal rate and regular rhythm.  Respiratory: Effort normal and breath sounds normal.  GI: Soft. Bowel sounds are normal.  Musculoskeletal:       Right hip: She exhibits decreased range of motion, decreased strength, tenderness, bony tenderness and deformity.  Neurological: She is alert and oriented to person, place, and time.  Skin: Skin is warm and dry.  Psychiatric: She has a normal mood and affect.    Vital signs in last 24 hours: Temp:  [98.3 F (36.8 C)] 98.3 F (36.8 C) (10/18 0646) Pulse Rate:  [71] 71 (10/18 0646) Resp:  [16] 16 (10/18 0646) BP: (145)/(78) 145/78 (10/18 0646) SpO2:  [96 %] 96 % (10/18 0646) Weight:  [80.7 kg] 80.7 kg (10/18 0652)  Labs:   Estimated body mass index is 32.56 kg/m as calculated from the following:   Height as of this encounter: 5\' 2"  (1.575  m).   Weight as of this encounter: 80.7 kg.   Imaging Review Plain radiographs demonstrate severe degenerative joint disease of the right hip(s). The bone quality appears to be good for age and reported activity level.    Preoperative templating of the joint replacement has been completed, documented, and submitted to the Operating Room personnel in order to optimize intra-operative equipment management.     Assessment/Plan:  End stage arthritis, right hip(s)  The patient history, physical examination, clinical judgement of the provider and imaging studies are consistent with end stage degenerative joint disease of the right hip(s) and total hip arthroplasty is deemed medically necessary. The treatment options including medical management, injection therapy, arthroscopy and arthroplasty were discussed at length. The risks and benefits of total hip arthroplasty were presented and reviewed. The risks due to aseptic loosening, infection, stiffness, dislocation/subluxation,  thromboembolic complications and other imponderables were discussed.  The patient acknowledged the explanation, agreed to proceed with the plan and consent was signed. Patient is being admitted for inpatient treatment for surgery, pain control, PT, OT, prophylactic antibiotics, VTE prophylaxis, progressive  ambulation and ADL's and discharge planning.The patient is planning to be discharged home with home health services

## 2018-04-28 NOTE — Brief Op Note (Signed)
04/28/2018  10:03 AM  PATIENT:  Sylvia Davies  56 y.o. female  PRE-OPERATIVE DIAGNOSIS:  Osteoarthritis Right Hip  POST-OPERATIVE DIAGNOSIS:  Osteoarthritis Right Hip  PROCEDURE:  Procedure(s): RIGHT TOTAL HIP ARTHROPLASTY ANTERIOR APPROACH (Right)  SURGEON:  Surgeon(s) and Role:    Mcarthur Rossetti, MD - Primary  PHYSICIAN ASSISTANT: Benita Stabile, PA-C  ANESTHESIA:   spinal  EBL:  200 mL   COUNTS:  YES  DICTATION: .Other Dictation: Dictation Number [id number]  PLAN OF CARE: Admit to inpatient   PATIENT DISPOSITION:  PACU - hemodynamically stable.   Delay start of Pharmacological VTE agent (>24hrs) due to surgical blood loss or risk of bleeding: no

## 2018-04-28 NOTE — Anesthesia Procedure Notes (Signed)
Spinal  Patient location during procedure: OR Start time: 04/28/2018 8:39 AM End time: 04/28/2018 8:42 AM Staffing Anesthesiologist: Ellender, Ryan P, MD Resident/CRNA: Alday, Stephen R, CRNA Performed: resident/CRNA  Preanesthetic Checklist Completed: patient identified, site marked, surgical consent, pre-op evaluation, timeout performed, IV checked, risks and benefits discussed and monitors and equipment checked Spinal Block Patient position: sitting Prep: DuraPrep Patient monitoring: heart rate, continuous pulse ox, blood pressure and cardiac monitor Approach: midline Location: L3-4 Injection technique: single-shot Needle Needle type: Pencan  Needle gauge: 24 G Needle length: 10 cm Needle insertion depth: 7 cm Assessment Sensory level: T6 Additional Notes Timeout performed. SAB kit date checked. SAB without difficulty     

## 2018-04-28 NOTE — Transfer of Care (Signed)
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  Last Pain:  Vitals:   04/28/18 0646  TempSrc: Oral      Patients Stated Pain Goal: 4 (02/77/41 2878)  Complications: No apparent anesthesia complications

## 2018-04-29 LAB — BASIC METABOLIC PANEL
ANION GAP: 9 (ref 5–15)
BUN: 7 mg/dL (ref 6–20)
CHLORIDE: 106 mmol/L (ref 98–111)
CO2: 23 mmol/L (ref 22–32)
Calcium: 9 mg/dL (ref 8.9–10.3)
Creatinine, Ser: 0.46 mg/dL (ref 0.44–1.00)
GFR calc non Af Amer: >60 mL/min (ref >60)
Glucose, Bld: 131 mg/dL — ABNORMAL HIGH (ref 70–99)
POTASSIUM: 4.1 mmol/L (ref 3.5–5.1)
SODIUM: 138 mmol/L (ref 135–145)

## 2018-04-29 LAB — CBC
HCT: 38.5 % (ref 36.0–46.0)
HEMOGLOBIN: 11.9 g/dL — AB (ref 12.0–15.0)
MCH: 26 pg (ref 26.0–34.0)
MCHC: 30.9 g/dL (ref 30.0–36.0)
MCV: 84.2 fL (ref 80.0–100.0)
Platelets: 195 10*3/uL (ref 150–400)
RBC: 4.57 MIL/uL (ref 3.87–5.11)
RDW: 14.8 % (ref 11.5–15.5)
WBC: 14.7 10*3/uL — AB (ref 4.0–10.5)
nRBC: 0 % (ref 0.0–0.2)

## 2018-04-29 MED ORDER — TRAMADOL HCL 50 MG PO TABS: 50.0000 mg | ORAL_TABLET | Freq: Four times a day (QID) | ORAL | 0 refills | Status: DC | PRN

## 2018-04-29 MED ORDER — METHOCARBAMOL 500 MG PO TABS: 500.0000 mg | ORAL_TABLET | Freq: Four times a day (QID) | ORAL | 0 refills | Status: DC | PRN

## 2018-04-29 MED ORDER — ONDANSETRON 4 MG PO TBDP: 4.0000 mg | ORAL_TABLET | Freq: Three times a day (TID) | ORAL | 0 refills | Status: DC | PRN

## 2018-04-29 MED ORDER — ASPIRIN 81 MG PO CHEW: 81.0000 mg | CHEWABLE_TABLET | Freq: Two times a day (BID) | ORAL | 0 refills | Status: DC

## 2018-04-29 NOTE — Plan of Care (Signed)
Plan of care reviewed and discussed with patient.   

## 2018-04-29 NOTE — Care Management Note (Addendum)
Case Management Note  Patient Details  Name: Sylvia Davies MRN: [id number] Date of Birth: 02-Sep-1960  Subjective/Objective:   Right THA                 Action/Plan: NCM spoke to pt and offered choice for [facility]. Pt agreeable to [facility] (preoperatively arranged from surgeon's office). Contacted [facility] for RW and 3n1 bedside commode for home. Will be delivered to room prior dc. Husband and dtr will be at home to assist with care.   Expected Discharge Date:  04/30/2018               Expected Discharge Plan:  [place]  In-House Referral:  NA  Discharge planning Services  CM Consult  Post Acute Care Choice:  Home Health Choice offered to:  Patient  DME Arranged:  3-N-1, Walker rolling DME Agency:  [place]:  PT [place] Agency:  [facility] ([facility]  Status of Service:  Completed, signed off  If discussed at [employer], dates discussed:    Additional Comments:  Erenest Rasher, RN 04/29/2018, 11:12 AM

## 2018-04-29 NOTE — Plan of Care (Signed)
  Problem: Activity: Goal: Risk for activity intolerance will decrease Outcome: Progressing   Problem: Nutrition: Goal: Adequate nutrition will be maintained Outcome: Progressing   Problem: Elimination: Goal: Will not experience complications related to bowel motility Outcome: Progressing   

## 2018-04-29 NOTE — Evaluation (Signed)
Occupational Therapy Evaluation Patient Details Name: Sylvia Davies MRN: [id number] DOB: 12-25-1960 Today's Date: 04/29/2018    History of Present Illness s/p R THA   Clinical Impression   Session limited as pt feeling a little woozy/lightheaded in standing. Pt sat back down in recliner to rest after. Did practice LB dressing strategies, discussed AE for LB self care, DME options and safety with tub transfers. Will benefit from continued OT to progress ADL independence.     Follow Up Recommendations  No OT follow up    Equipment Recommendations  3 in 1 bedside commode    Recommendations for Other Services       Precautions / Restrictions Precautions Precautions: Fall Restrictions Weight Bearing Restrictions: No Other Position/Activity Restrictions: WBAT      Mobility Bed Mobility      General bed mobility comments: in chair.   Transfers Overall transfer level: Needs assistance Equipment used: Rolling walker (2 wheeled) Transfers: Sit to/from Stand Sit to Stand: Min assist         General transfer comment: cues for hand placement and min assist to steady.    Balance Overall balance assessment: Needs assistance           Standing balance-Leahy Scale: Poor Standing balance comment: reliant on UEs                           ADL either performed or assessed with clinical judgement   ADL Overall ADL's : Needs assistance/impaired Eating/Feeding: Independent;Sitting   Grooming: Wash/dry hands;Set up;Sitting   Upper Body Bathing: Set up;Sitting   Lower Body Bathing: Moderate assistance;Sit to/from stand   Upper Body Dressing : Set up;Sitting   Lower Body Dressing: Moderate assistance;Sit to/from stand   Toilet Transfer: Minimal assistance;RW   Toileting- Clothing Manipulation and Hygiene: Minimal assistance;Sit to/from stand         General ADL Comments: Educated pt on AE options but pt states her family can assist with LB self  care. Pt had just returned from bathroom for toileting not long before OT arrived. Worked on LB dressing and attempting to reach down to don socks. Pt stood to Dietitian and pt stating she felt slightly woozy/lightheaded so assisted back into chair. Pt feeling some better with rest in chair. Discussed tub DME options also and pt states for right now she will sponge bathe. Educated pt on sequence for LB dressing and use of walker to stabilize for safety with pulling up clothing. Pt felt more comfortable to alternate hand hold on walker with clothing management. Did discuss sequence for stepping in and out of tub when pt feels ready to do tub transfer.     Vision Patient Visual Report: No change from baseline       Perception     Praxis      Pertinent Vitals/Pain Pain Assessment: 0-10 Pain Score: 5  Pain Location: R hip Pain Descriptors / Indicators: Sore Pain Intervention(s): Limited activity within patient's tolerance;Monitored during session     Hand Dominance     Extremity/Trunk Assessment Upper Extremity Assessment Upper Extremity Assessment: Overall WFL for tasks assessed   Lower Extremity Assessment RLE Deficits / Details: ankle WFL, knee and hip grossly 3/5, limited by post op pain       Communication Communication Communication: No difficulties   Cognition Arousal/Alertness: Awake/alert Behavior During Therapy: WFL for tasks assessed/performed Overall Cognitive Status: Within Functional Limits for tasks assessed  General Comments       Exercises     Shoulder Instructions      Home Living Family/patient expects to be discharged to:: Private residence Living Arrangements: Spouse/significant other;Children Available Help at Discharge: Available 24 hours/day Type of Home: House Home Access: Level entry;Stairs to enter Entrance Stairs-Number of Steps: 3-4   Home Layout: One level      Bathroom Shower/Tub: Teacher, early years/pre: Standard     Home Equipment: None          Prior Functioning/Environment Level of Independence: Independent                 OT Problem List: Decreased strength;Decreased activity tolerance;Decreased knowledge of use of DME or AE      OT Treatment/Interventions: Self-care/ADL training;DME and/or AE instruction;Therapeutic activities;Patient/family education    OT Goals(Current goals can be found in the care plan section) Acute Rehab OT Goals Patient Stated Goal: to feel better OT Goal Formulation: With patient/family Time For Goal Achievement: 05/06/18 Potential to Achieve Goals: Good  OT Frequency: Min 2X/week   Barriers to D/C:            Co-evaluation              AM-PAC PT "6 Clicks" Daily Activity     Outcome Measure Help from another person eating meals?: None Help from another person taking care of personal grooming?: A Little Help from another person toileting, which includes using toliet, bedpan, or urinal?: A Little Help from another person bathing (including washing, rinsing, drying)?: A Lot Help from another person to put on and taking off regular upper body clothing?: A Little Help from another person to put on and taking off regular lower body clothing?: A Lot 6 Click Score: 17   End of Session Equipment Utilized During Treatment: Rolling walker  Activity Tolerance: Other (comment)(some wooziness) Patient left: in chair;with call bell/phone within reach  OT Visit Diagnosis: Unsteadiness on feet (R26.81);Muscle weakness (generalized) (M62.81)                Time: 5885-0277 OT Time Calculation (min): 22 min Charges:  OT General Charges $OT Visit: 1 Visit OT Evaluation $OT Eval Low Complexity: 1 Low OT Treatments $Self Care/Home Management : 8-22 mins    Violet Cart S Billi Bright OTR/L Acute Rehab 04/29/2018, 3:29 PM

## 2018-04-29 NOTE — Discharge Instructions (Signed)

## 2018-04-29 NOTE — Progress Notes (Signed)
Physical Therapy Treatment Patient Details Name: Sylvia Davies MRN: [id number] DOB: [birth year] Today's Date: 04/29/2018    History of Present Illness s/p R THA    PT Comments    Initiated pm PT session, after beginning amb pt requested to go to bathroom and be left there until she was requested to come out; advised pt to call when ready; dtr in room; will see again for HEP review if schedule allows  Follow Up Recommendations  Follow surgeon's recommendation for DC plan and follow-up therapies     Equipment Recommendations  Rolling walker with 5" wheels    Recommendations for Other Services       Precautions / Restrictions Precautions Precautions: Fall Restrictions Weight Bearing Restrictions: No Other Position/Activity Restrictions: WBAT    Mobility  Bed Mobility Overal bed mobility: Needs Assistance Bed Mobility: Supine to Sit     Supine to sit: Min assist     General bed mobility comments: in chair.   Transfers Overall transfer level: Needs assistance Equipment used: Rolling walker (2 wheeled) Transfers: Sit to/from Stand Sit to Stand: Min assist         General transfer comment: cues for hand placement and min assist to steady.  Ambulation/Gait Ambulation/Gait assistance: Min guard Gait Distance (Feet): 15 Feet Assistive device: Rolling walker (2 wheeled) Gait Pattern/deviations: Step-to pattern;Decreased step length - left;Decreased stance time - right     General Gait Details: cues for sequence, Rw position, trunk extension; pt with c/o being very hot, mildly diaphoretic--chair brought to pt for seated rest and pt improved/symptoms resolved   Stairs             Wheelchair Mobility    Modified Rankin (Stroke Patients Only)       Balance Overall balance assessment: Needs assistance           Standing balance-Leahy Scale: Poor Standing balance comment: reliant on UEs                            Cognition  Arousal/Alertness: Awake/alert Behavior During Therapy: WFL for tasks assessed/performed Overall Cognitive Status: Within Functional Limits for tasks assessed                                        Exercises      General Comments        Pertinent Vitals/Pain Pain Assessment: 0-10 Pain Score: 5  Pain Location: R hip Pain Descriptors / Indicators: Sore Pain Intervention(s): Limited activity within patient's tolerance;Monitored during session    Home Living Family/patient expects to be discharged to:: Private residence Living Arrangements: Spouse/significant other;Children Available Help at Discharge: Available 24 hours/day Type of Home: House Home Access: Level entry;Stairs to enter   Home Layout: One level Home Equipment: None      Prior Function Level of Independence: Independent          PT Goals (current goals can now be found in the care plan section) Acute Rehab PT Goals PT Goal Formulation: With patient Time For Goal Achievement: 05/06/18 Potential to Achieve Goals: Good Progress towards PT goals: Progressing toward goals    Frequency    7X/week      PT Plan Current plan remains appropriate    Co-evaluation              AM-PAC PT "6 Clicks" Daily Activity  Outcome Measure  Difficulty turning over in bed (including adjusting bedclothes, sheets and blankets)?: Unable Difficulty moving from lying on back to sitting on the side of the bed? : Unable Difficulty sitting down on and standing up from a chair with arms (e.g., wheelchair, bedside commode, etc,.)?: Unable Help needed moving to and from a bed to chair (including a wheelchair)?: A Little Help needed walking in hospital room?: A Little Help needed climbing 3-5 steps with a railing? : A Lot 6 Click Score: 11    End of Session Equipment Utilized During Treatment: Gait belt Activity Tolerance: Patient limited by fatigue Patient left: Other (comment)(in bathroom per pt  request)   PT Visit Diagnosis: Difficulty in walking, not elsewhere classified (R26.2)     Time: 6861-6837 PT Time Calculation (min) (ACUTE ONLY): 10 min  Charges:  $Gait Training: 8-22 mins                     Kenyon Ana, PT  Pager: [phone] Acute Rehab Dept [facility]): [phone]   04/29/2018    [facility] 04/29/2018, 3:29 PM

## 2018-04-29 NOTE — Evaluation (Signed)
Physical Therapy Evaluation Patient Details Name: Sylvia Davies MRN: [id number] DOB: 1961/01/18 Today's Date: 04/29/2018   History of Present Illness  s/p R THA  Clinical Impression  Pt is s/p THA resulting in the deficits listed below (see PT Problem List). Pt amb short distance in room today, fatigued quickly; will continue to follow;  Pt will benefit from skilled PT to increase their independence and safety with mobility to allow discharge to the venue listed below.      Follow Up Recommendations Follow surgeon's recommendation for DC plan and follow-up therapies    Equipment Recommendations  Rolling walker with 5" wheels    Recommendations for Other Services       Precautions / Restrictions Precautions Precautions: Fall Restrictions Weight Bearing Restrictions: No Other Position/Activity Restrictions: WBAT      Mobility  Bed Mobility Overal bed mobility: Needs Assistance Bed Mobility: Supine to Sit     Supine to sit: Min assist     General bed mobility comments: incr time needed, assist with RLE  Transfers Overall transfer level: Needs assistance Equipment used: Rolling walker (2 wheeled) Transfers: Sit to/from Stand Sit to Stand: Min assist         General transfer comment: assist to rise, stabilize and transition to RW  Ambulation/Gait Ambulation/Gait assistance: Min guard Gait Distance (Feet): 6 Feet Assistive device: Rolling walker (2 wheeled) Gait Pattern/deviations: Step-to pattern;Decreased step length - left;Decreased stance time - right     General Gait Details: cues for sequence, Rw position, trunk extension; pt with c/o being very hot, mildly diaphoretic--chair brought to pt for seated rest and pt improved/symptoms resolved  Stairs            Wheelchair Mobility    Modified Rankin (Stroke Patients Only)       Balance Overall balance assessment: Needs assistance           Standing balance-Leahy Scale: Poor Standing  balance comment: reliant on UEs                             Pertinent Vitals/Pain Pain Assessment: 0-10 Pain Score: 2  Pain Location: right hip Pain Descriptors / Indicators: Discomfort Pain Intervention(s): Premedicated before session;Monitored during session    Home Living Family/patient expects to be discharged to:: Private residence Living Arrangements: Spouse/significant other;Children Available Help at Discharge: Available 24 hours/day Type of Home: House Home Access: Level entry;Stairs to enter   Entrance Stairs-Number of Steps: 3-4 Home Layout: One level Home Equipment: None      Prior Function Level of Independence: Independent               Hand Dominance        Extremity/Trunk Assessment   Upper Extremity Assessment Upper Extremity Assessment: Defer to OT evaluation    Lower Extremity Assessment Lower Extremity Assessment: RLE deficits/detail RLE Deficits / Details: ankle WFL, knee and hip grossly 3/5, limited by post op pain       Communication   Communication: No difficulties  Cognition Arousal/Alertness: Awake/alert Behavior During Therapy: WFL for tasks assessed/performed Overall Cognitive Status: Within Functional Limits for tasks assessed                                        General Comments      Exercises     Assessment/Plan    PT  Assessment Patient needs continued PT services  PT Problem List Decreased strength;Decreased activity tolerance;Decreased balance;Decreased knowledge of use of DME;Pain;Decreased mobility       PT Treatment Interventions DME instruction;Gait training;Therapeutic activities;Therapeutic exercise;Patient/family education;Functional mobility training    PT Goals (Current goals can be found in the Care Plan section)  Acute Rehab PT Goals PT Goal Formulation: With patient Time For Goal Achievement: 05/06/18 Potential to Achieve Goals: Good    Frequency 7X/week   Barriers  to discharge        Co-evaluation               AM-PAC PT "6 Clicks" Daily Activity  Outcome Measure Difficulty turning over in bed (including adjusting bedclothes, sheets and blankets)?: Unable Difficulty moving from lying on back to sitting on the side of the bed? : Unable Difficulty sitting down on and standing up from a chair with arms (e.g., wheelchair, bedside commode, etc,.)?: Unable Help needed moving to and from a bed to chair (including a wheelchair)?: A Little Help needed walking in hospital room?: A Little Help needed climbing 3-5 steps with a railing? : A Lot 6 Click Score: 11    End of Session Equipment Utilized During Treatment: Gait belt Activity Tolerance: Patient limited by fatigue Patient left: in chair;with call bell/phone within reach;with family/visitor present   PT Visit Diagnosis: Difficulty in walking, not elsewhere classified (R26.2)    Time: 7793-9030 PT Time Calculation (min) (ACUTE ONLY): 19 min   Charges:   PT Evaluation $PT Eval Low Complexity: 1 Low          Kenyon Ana, PT  Pager: [phone] Acute Rehab Dept [facility]): [phone]   04/29/2018   [facility] 04/29/2018, 1:13 PM

## 2018-04-29 NOTE — Progress Notes (Signed)
Subjective: 1 Day Post-Op Procedure(s) (LRB): RIGHT TOTAL HIP ARTHROPLASTY ANTERIOR APPROACH (Right) Patient reports pain as moderate.    Objective: Vital signs in last 24 hours: Temp:  [97.4 F (36.3 C)-98.4 F (36.9 C)] 98.1 F (36.7 C) (10/19 1030) Pulse Rate:  [72-93] 82 (10/19 1030) Resp:  [16-20] 16 (10/19 1030) BP: (115-156)/(71-103) 141/75 (10/19 1030) SpO2:  [97 %-100 %] 98 % (10/19 1030)  Intake/Output from previous day: 10/18 0701 - 10/19 0700 In: 3039.3 [P.O.:340; I.V.:2699.3] Out: 3360 [Urine:3160; Blood:200] Intake/Output this shift: Total I/O In: 540 [P.O.:240; I.V.:300] Out: 1775 [Urine:1775]  Recent Labs    04/29/18 0440  HGB 11.9*   Recent Labs    04/29/18 0440  WBC 14.7*  RBC 4.57  HCT 38.5  PLT 195   Recent Labs    04/29/18 0440  NA 138  K 4.1  CL 106  CO2 23  BUN 7  CREATININE 0.46  GLUCOSE 131*  CALCIUM 9.0   No results for input(s): LABPT, INR in the last 72 hours.  Sensation intact distally Intact pulses distally Dorsiflexion/Plantar flexion intact Incision: scant drainage   Assessment/Plan: 1 Day Post-Op Procedure(s) (LRB): RIGHT TOTAL HIP ARTHROPLASTY ANTERIOR APPROACH (Right) Up with therapy Plan for discharge tomorrow Discharge home with home health    Mcarthur Rossetti 04/29/2018, 11:26 AM

## 2018-04-29 NOTE — Progress Notes (Signed)
   04/29/18 1700  PT Visit Information  Last PT Received On 04/29/18--exercise focused session, pt completed THA exercises, pain fairly well controlled, ice applied end of session; continue PT POC  Assistance Needed +1  History of Present Illness s/p R THA  Subjective Data  Patient Stated Goal to feel better  Precautions  Precautions Fall  Restrictions  Other Position/Activity Restrictions WBAT  Pain Assessment  Pain Assessment 0-10  Pain Score 4  Pain Location R hip  Pain Descriptors / Indicators Sore  Pain Intervention(s) Limited activity within patient's tolerance;Monitored during session;Ice applied  Cognition  Arousal/Alertness Awake/alert  Behavior During Therapy WFL for tasks assessed/performed  Overall Cognitive Status Within Functional Limits for tasks assessed  Bed Mobility  General bed mobility comments in chair.   Total Joint Exercises  Ankle Circles/Pumps AROM;Both;10 reps  Short Arc Quad AROM;Right;10 reps  Heel Slides AAROM;Right;10 reps  Hip ABduction/ADduction AROM;Right;10 reps  PT - End of Session  Activity Tolerance Patient tolerated treatment well  Patient left in chair;with call bell/phone within reach;with chair alarm set   PT - Assessment/Plan  PT Plan Current plan remains appropriate  PT Visit Diagnosis Difficulty in walking, not elsewhere classified (R26.2)  PT Frequency (ACUTE ONLY) 7X/week  Follow Up Recommendations Follow surgeon's recommendation for DC plan and follow-up therapies  PT equipment Rolling walker with 5" wheels  AM-PAC PT "6 Clicks" Daily Activity Outcome Measure  Difficulty turning over in bed (including adjusting bedclothes, sheets and blankets)? 1  Difficulty moving from lying on back to sitting on the side of the bed?  1  Difficulty sitting down on and standing up from a chair with arms (e.g., wheelchair, bedside commode, etc,.)? 1  Help needed moving to and from a bed to chair (including a wheelchair)? 3  Help needed walking in  hospital room? 3  Help needed climbing 3-5 steps with a railing?  2  6 Click Score 11  Mobility G Code  CL  PT Goal Progression  Progress towards PT goals Progressing toward goals  Acute Rehab PT Goals  PT Goal Formulation With patient  Time For Goal Achievement 05/06/18  Potential to Achieve Goals Good  PT Time Calculation  PT Start Time (ACUTE ONLY) 1619  PT Stop Time (ACUTE ONLY) 1634  PT Time Calculation (min) (ACUTE ONLY) 15 min  PT General Charges  $$ ACUTE PT VISIT 1 Visit  PT Treatments  $Therapeutic Exercise 8-22 mins  Kenyon Ana, PT  Pager: [phone] Acute Rehab Dept [facility]): [phone]   04/29/2018

## 2018-04-30 MED ORDER — TRAMADOL HCL 50 MG PO TABS: 50.0000 mg | ORAL_TABLET | Freq: Four times a day (QID) | ORAL | Status: DC | PRN

## 2018-04-30 NOTE — Progress Notes (Signed)
Occupational Therapy Treatment Patient Details Name: Sylvia Davies MRN: [id number] DOB: March 19, 1961 Today's Date: 04/30/2018    History of present illness s/p R THA   OT comments  All OT education completed and patient questions answered. No further OT needs at this time. Patient reports she is discharging home today.   Follow Up Recommendations  No OT follow up    Equipment Recommendations  3 in 1 bedside commode    Recommendations for Other Services      Precautions / Restrictions Precautions Precautions: Fall Restrictions Weight Bearing Restrictions: No Other Position/Activity Restrictions: WBAT       Mobility Bed Mobility Overal bed mobility: Needs Assistance Bed Mobility: Supine to Sit     Supine to sit: Min assist        Transfers Overall transfer level: Needs assistance Equipment used: Rolling walker (2 wheeled) Transfers: Sit to/from Stand Sit to Stand: Min guard              Balance                                           ADL either performed or assessed with clinical judgement   ADL Overall ADL's : Needs assistance/impaired     Grooming: Wash/dry hands;Supervision/safety;Standing                   Toilet Transfer: Min guard;Ambulation;RW;Comfort height toilet   Toileting- Clothing Manipulation and Hygiene: Supervision/safety;Sit to/from stand         General ADL Comments: Patient reports that family will assist her with LB self-care, and that she plans to sponge bathe for now. No wooziness today. Patient ambulated to bathroom for toileting and grooming, then up to recliner at end of session.     Vision       Perception     Praxis      Cognition Arousal/Alertness: Awake/alert Behavior During Therapy: WFL for tasks assessed/performed Overall Cognitive Status: Within Functional Limits for tasks assessed                                          Exercises     Shoulder  Instructions       General Comments      Pertinent Vitals/ Pain       Pain Assessment: 0-10 Pain Score: 6  Pain Location: R hip Pain Descriptors / Indicators: Sore Pain Intervention(s): Limited activity within patient's tolerance;Monitored during session;Ice applied  Home Living                                          Prior Functioning/Environment              Frequency           Progress Toward Goals  OT Goals(current goals can now be found in the care plan section)  Progress towards OT goals: Goals met/education completed, patient discharged from OT  Acute Rehab OT Goals Patient Stated Goal: to feel better  Plan All goals met and education completed, patient discharged from OT services    Co-evaluation                 AM-PAC  PT "6 Clicks" Daily Activity     Outcome Measure   Help from another person eating meals?: None Help from another person taking care of personal grooming?: None Help from another person toileting, which includes using toliet, bedpan, or urinal?: A Little Help from another person bathing (including washing, rinsing, drying)?: A Little Help from another person to put on and taking off regular upper body clothing?: None Help from another person to put on and taking off regular lower body clothing?: A Little 6 Click Score: 21    End of Session Equipment Utilized During Treatment: Rolling walker  OT Visit Diagnosis: Unsteadiness on feet (R26.81);Muscle weakness (generalized) (M62.81)   Activity Tolerance     Patient Left in chair;with call bell/phone within reach   Nurse Communication Mobility status        Time: 0850-0908 OT Time Calculation (min): 18 min  Charges: OT General Charges $OT Visit: 1 Visit OT Treatments $Self Care/Home Management : 8-22 mins     Leila A Early 04/30/2018, 11:10 AM    

## 2018-04-30 NOTE — Progress Notes (Signed)
   Subjective: 2 Days Post-Op Procedure(s) (LRB): RIGHT TOTAL HIP ARTHROPLASTY ANTERIOR APPROACH (Right) Patient reports pain as mild.   Objective: Vital signs in last 24 hours: Temp:  [98.1 F (36.7 C)-100 F (37.8 C)] 98.8 F (37.1 C) (10/20 6060) Pulse Rate:  [62-89] 83 (10/20 0922) Resp:  [16] 16 (10/20 0608) BP: (121-153)/(73-93) 121/73 (10/20 0922) SpO2:  [97 %-99 %] 97 % (10/20 0922)  Intake/Output from previous day: 10/19 0701 - 10/20 0700 In: 1523.5 [P.O.:720; I.V.:803.5] Out: 2475 [Urine:2475] Intake/Output this shift: Total I/O In: 240 [P.O.:240] Out: -   Recent Labs    04/29/18 0440  HGB 11.9*   Recent Labs    04/29/18 0440  WBC 14.7*  RBC 4.57  HCT 38.5  PLT 195   Recent Labs    04/29/18 0440  NA 138  K 4.1  CL 106  CO2 23  BUN 7  CREATININE 0.46  GLUCOSE 131*  CALCIUM 9.0   No results for input(s): LABPT, INR in the last 72 hours.  Neurologically intact dressing dry. LL equal.  No results found.  Assessment/Plan: 2 Days Post-Op Procedure(s) (LRB): RIGHT TOTAL HIP ARTHROPLASTY ANTERIOR APPROACH (Right) Discharge home with home health  Sylvia Davies 04/30/2018, 10:04 AM

## 2018-04-30 NOTE — Progress Notes (Signed)
Physical Therapy Treatment Patient Details Name: Sylvia Davies MRN: [id number] DOB: [birth year] Today's Date: 04/30/2018    History of Present Illness s/p R THA    PT Comments    Progressing well; feels ready for d/c today   Follow Up Recommendations  Follow surgeon's recommendation for DC plan and follow-up therapies     Equipment Recommendations  Rolling walker with 5" wheels    Recommendations for Other Services       Precautions / Restrictions Precautions Precautions: Fall Restrictions Weight Bearing Restrictions: No Other Position/Activity Restrictions: WBAT    Mobility  Bed Mobility Overal bed mobility: Needs Assistance Bed Mobility: Supine to Sit     Supine to sit: Min assist     General bed mobility comments: in chair.   Transfers Overall transfer level: Needs assistance Equipment used: Rolling walker (2 wheeled) Transfers: Sit to/from Stand Sit to Stand: Min guard         General transfer comment: cues for hand placement and min assist to steady.  Ambulation/Gait Ambulation/Gait assistance: Supervision Gait Distance (Feet): 55 Feet Assistive device: Rolling walker (2 wheeled) Gait Pattern/deviations: Step-to pattern;Decreased step length - left;Decreased stance time - right     General Gait Details: cues for sequence   Stairs Stairs: Yes Stairs assistance: Min guard Stair Management: One rail Right;One rail Left;Sideways Number of Stairs: 3(x2) General stair comments: cues for sequence and safety   Wheelchair Mobility    Modified Rankin (Stroke Patients Only)       Balance                                            Cognition Arousal/Alertness: Awake/alert Behavior During Therapy: WFL for tasks assessed/performed Overall Cognitive Status: Within Functional Limits for tasks assessed                                        Exercises Total Joint Exercises Ankle Circles/Pumps:  AROM;Both;10 reps Quad Sets: AROM;Both;10 reps    General Comments        Pertinent Vitals/Pain Pain Assessment: 0-10 Pain Score: 4  Pain Location: R hip Pain Descriptors / Indicators: Sore Pain Intervention(s): Limited activity within patient's tolerance;Monitored during session;Ice applied    Home Living                      Prior Function            PT Goals (current goals can now be found in the care plan section) Acute Rehab PT Goals Patient Stated Goal: to feel better PT Goal Formulation: With patient Time For Goal Achievement: 05/06/18 Potential to Achieve Goals: Good Progress towards PT goals: Progressing toward goals    Frequency    7X/week      PT Plan Current plan remains appropriate    Co-evaluation              AM-PAC PT "6 Clicks" Daily Activity  Outcome Measure  Difficulty turning over in bed (including adjusting bedclothes, sheets and blankets)?: A Lot Difficulty moving from lying on back to sitting on the side of the bed? : A Lot Difficulty sitting down on and standing up from a chair with arms (e.g., wheelchair, bedside commode, etc,.)?: A Little Help needed moving to and from a  bed to chair (including a wheelchair)?: A Little Help needed walking in hospital room?: A Little Help needed climbing 3-5 steps with a railing? : A Little 6 Click Score: 16    End of Session Equipment Utilized During Treatment: Gait belt Activity Tolerance: Patient tolerated treatment well Patient left: in chair;with call bell/phone within reach;with chair alarm set   PT Visit Diagnosis: Difficulty in walking, not elsewhere classified (R26.2)     Time: 4045-9136 PT Time Calculation (min) (ACUTE ONLY): 26 min  Charges:  $Gait Training: 23-37 mins                     Kenyon Ana, PT  Pager: [phone] Acute Rehab Dept [facility]): [phone]   04/30/2018    [employer] 04/30/2018, 12:41 PM

## 2018-05-02 ENCOUNTER — Telehealth: Payer: Self-pay | Admitting: *Deleted

## 2018-05-02 ENCOUNTER — Telehealth (INDEPENDENT_AMBULATORY_CARE_PROVIDER_SITE_OTHER): Payer: Self-pay | Admitting: Orthopaedic Surgery

## 2018-05-02 ENCOUNTER — Encounter (HOSPITAL_COMMUNITY): Payer: Self-pay | Admitting: Orthopaedic Surgery

## 2018-05-02 NOTE — Telephone Encounter (Signed)
Cindee Salt from [place] [facility] called requesting VO for the following:  3x a week for 2 weeks for strengthening and balance also ambulation.  JW#[phone]

## 2018-05-02 NOTE — Telephone Encounter (Signed)
Verbal left on VM 

## 2018-05-02 NOTE — Telephone Encounter (Signed)
Pt was on TCM report admitted 04/28/18 for right total hip arthroplasty. The patient tolerated procedure well and will be  discharged home on 04/30/18 with home health services. Pt will follow-up w/orthopedic provider in 2 weeks.Marland KitchenJohny Davies

## 2018-05-03 NOTE — Discharge Summary (Signed)
Patient ID: Sylvia Davies MRN: [id number] DOB/AGE: [birth year] 57 y.o.  Admit date: 04/28/2018 Discharge date: 05/03/2018  Admission Diagnoses:  Principal Problem:   Unilateral primary osteoarthritis, right hip Active Problems:   Status post total replacement of right hip   Discharge Diagnoses:  Same  Past Medical History:  Diagnosis Date  . ALLERGIC RHINITIS 04/02/2007  . Arthritis   . BREAST BIOPSY, HX OF 04/02/2007  . Diverticulosis   . FIBROCYSTIC BREAST DISEASE, HX OF 04/02/2007  . Hemorrhoids   . HYPERLIPIDEMIA 04/02/2007  . HYPERTENSION, BENIGN ESSENTIAL 05/04/2007  . Impaired glucose tolerance 11/08/2012  . Irritable bowel syndrome 04/02/2007  . MITRAL REGURGITATION, 0 (MILD) 04/02/2007  . OBESITY 04/02/2007    Surgeries: Procedure(s): RIGHT TOTAL HIP ARTHROPLASTY ANTERIOR APPROACH on 04/28/2018   Consultants:   Discharged Condition: Improved  Hospital Course: Sylvia Davies is an 57 y.o. female who was admitted 04/28/2018 for operative treatment ofUnilateral primary osteoarthritis, right hip. Patient has severe unremitting pain that affects sleep, daily activities, and work/hobbies. After pre-op clearance the patient was taken to the operating room on 04/28/2018 and underwent  Procedure(s): RIGHT TOTAL HIP ARTHROPLASTY ANTERIOR APPROACH.    Patient was given perioperative antibiotics:  Anti-infectives (From admission, onward)   Start     Dose/Rate Route Frequency Ordered Stop   04/28/18 2000  vancomycin (VANCOCIN) IVPB 1000 mg/200 mL premix     1,000 mg 200 mL/hr over 60 Minutes Intravenous Every 12 hours 04/28/18 1153 04/28/18 2115   04/28/18 0645  vancomycin (VANCOCIN) IVPB 1000 mg/200 mL premix     1,000 mg 200 mL/hr over 60 Minutes Intravenous On call to O.R. 04/28/18 3419 04/28/18 0913       Patient was given sequential compression devices, early ambulation, and chemoprophylaxis to prevent DVT.  Patient benefited maximally from hospital stay and  there were no complications.    Recent vital signs: No data found.   Recent laboratory studies: No results for input(s): WBC, HGB, HCT, PLT, NA, K, CL, CO2, BUN, CREATININE, GLUCOSE, INR, CALCIUM in the last 72 hours.  Invalid input(s): PT, 2   Discharge Medications:   Allergies as of 04/30/2018      Reactions   Amlodipine Besy-benazepril Hcl Itching   REACTION:sore throat   Cleocin [clindamycin Hcl] Itching   Hyoscyamine    Penicillins    Has patient had a PCN reaction causing immediate rash, facial/tongue/throat swelling, SOB or lightheadedness with hypotension: yes Has patient had a PCN reaction causing severe rash involving mucus membranes or skin necrosis: no Has patient had a PCN reaction that required hospitalization no Has patient had a PCN reaction occurring within the last 10 years: yes If all of the above answers are "NO", then may proceed with Cephalosporin use.   Tramadol Hives   Zylet [loteprednol-tobramycin] Itching      Medication List    TAKE these medications   amLODipine 5 MG tablet Commonly known as:  NORVASC Take 1 tablet (5 mg total) by mouth daily.   aspirin 81 MG chewable tablet Chew 1 tablet (81 mg total) by mouth 2 (two) times daily.   atorvastatin 10 MG tablet Commonly known as:  LIPITOR TAKE 1 TABLET (10 MG TOTAL) BY MOUTH DAILY AT 6 PM. What changed:    how much to take  how to take this  when to take this   cetirizine 10 MG tablet Commonly known as:  ZYRTEC Take 10 mg by mouth daily as needed for allergies.   methocarbamol 500  MG tablet Commonly known as:  ROBAXIN Take 1 tablet (500 mg total) by mouth every 6 (six) hours as needed for muscle spasms.   MULTIVITAMIN PO Take 1 tablet by mouth daily.   ondansetron 4 MG disintegrating tablet Commonly known as:  ZOFRAN-ODT Take 1 tablet (4 mg total) by mouth every 8 (eight) hours as needed for nausea or vomiting.   traMADol 50 MG tablet Commonly known as:  ULTRAM Take 1-2 tablets  (50-100 mg total) by mouth every 6 (six) hours as needed for moderate pain.   Vitamin D (Ergocalciferol) 50000 units Caps capsule Commonly known as:  DRISDOL TAKE 1 CAPSULE (50,000 UNITS TOTAL) BY MOUTH EVERY 7 (SEVEN) DAYS. What changed:  when to take this       Diagnostic Studies: Dg Pelvis Portable  Result Date: 04/28/2018 CLINICAL DATA:  Status post right hip replacement. Dr. Ninfa Linden EXAM: PORTABLE PELVIS 1 VIEW COMPARISON:  02/22/2018 FINDINGS: Interval right hip arthroplasty, femoral and acetabular components projecting in expected location. Negative for fracture or dislocation. Residual spurring from the right supra-acetabular region. Lateral skin staples. IMPRESSION: 1. Interval right hip arthroplasty without apparent complication. Electronically Signed   By: Lucrezia Europe M.D.   On: 04/28/2018 11:01   Dg C-arm 1-60 Min-no Report  Result Date: 04/28/2018 Fluoroscopy was utilized by the requesting physician.  No radiographic interpretation.   Dg Hip Operative Unilat With Pelvis Right  Result Date: 04/28/2018 CLINICAL DATA:  Hip replacement EXAM: OPERATIVE RIGHT HIP (WITH PELVIS IF PERFORMED) 4 VIEWS TECHNIQUE: Fluoroscopic spot image(s) were submitted for interpretation post-operatively. COMPARISON:  02/22/2018 FINDINGS: Multiple intraoperative spot images demonstrate changes of right hip replacement. No hardware bony complicating feature. Normal AP alignment. IMPRESSION: Right hip replacement.  No visible complicating feature. Electronically Signed   By: Rolm Baptise M.D.   On: 04/28/2018 10:21    Disposition:     Follow-up Information    [facility] Follow up.   Specialty:  [place] Why:  [place] will call to arrange initial visit Contact information: [place] [address] [phone]        Mcarthur Rossetti, MD. Schedule an appointment as soon as possible for a visit in 2 week(s).    Specialty:  Orthopedic Surgery Contact information: [place] [address] [phone]            Signed: Erskine Emery 05/03/2018, 8:30 AM

## 2018-05-11 ENCOUNTER — Encounter (INDEPENDENT_AMBULATORY_CARE_PROVIDER_SITE_OTHER): Payer: Self-pay | Admitting: Physician Assistant

## 2018-05-11 ENCOUNTER — Ambulatory Visit (INDEPENDENT_AMBULATORY_CARE_PROVIDER_SITE_OTHER): Payer: BC Managed Care – PPO | Admitting: Physician Assistant

## 2018-05-11 DIAGNOSIS — Z96641 Presence of right artificial hip joint: Secondary | ICD-10-CM

## 2018-05-11 NOTE — Progress Notes (Signed)
HPI: Sylvia Davies returns today 2 weeks status post right total hip arthroplasty.  She has no complaints.  She denies any fevers chills shortness of breath chest pain.  She has some stiffness whenever she first begins to ambulate.  She is ambulating with a cane.  Physical exam: Right hip she has limited internal and external rotation.  Calf supple nontender.  Dorsiflexion plantarflexion ankle intact.  She is able to flex her hip.  Surgical incisions well approximated staples no signs of infection.  Impression: Status post right total hip arthroplasty  Plan: She will work on range of motion therapy.  She will remain on aspirin for another week and then discontinue.  Staples were removed today Steri-Strips applied.  We will see her back in 1 month sooner if there is any questions or concerns.

## 2018-05-23 ENCOUNTER — Telehealth (INDEPENDENT_AMBULATORY_CARE_PROVIDER_SITE_OTHER): Payer: Self-pay | Admitting: Orthopaedic Surgery

## 2018-05-23 MED ORDER — FLUCONAZOLE 150 MG PO TABS: 150.0000 mg | ORAL_TABLET | Freq: Once | ORAL | 0 refills | Status: AC

## 2018-05-23 NOTE — Telephone Encounter (Signed)
Patient aware Rx sent in for her  

## 2018-05-23 NOTE — Telephone Encounter (Signed)
Patient called stating that the medication she is on has given her a yeast infection and is wondering if she can be prescribed anything for it ?  Patients # [phone]

## 2018-05-23 NOTE — Telephone Encounter (Signed)
Please advise 

## 2018-05-23 NOTE — Telephone Encounter (Signed)
I sent in a single dose of Diflucan.

## 2018-05-30 ENCOUNTER — Other Ambulatory Visit (INDEPENDENT_AMBULATORY_CARE_PROVIDER_SITE_OTHER): Payer: Self-pay

## 2018-05-30 MED ORDER — DOXYCYCLINE HYCLATE 100 MG PO CAPS: ORAL_CAPSULE | ORAL | 0 refills | Status: DC

## 2018-05-31 ENCOUNTER — Other Ambulatory Visit (INDEPENDENT_AMBULATORY_CARE_PROVIDER_SITE_OTHER): Payer: Self-pay | Admitting: Orthopaedic Surgery

## 2018-05-31 NOTE — Telephone Encounter (Signed)
Please advise 

## 2018-06-14 ENCOUNTER — Encounter (INDEPENDENT_AMBULATORY_CARE_PROVIDER_SITE_OTHER): Payer: Self-pay | Admitting: Orthopaedic Surgery

## 2018-06-14 ENCOUNTER — Ambulatory Visit (INDEPENDENT_AMBULATORY_CARE_PROVIDER_SITE_OTHER): Payer: BC Managed Care – PPO | Admitting: Orthopaedic Surgery

## 2018-06-14 DIAGNOSIS — B3731 Acute candidiasis of vulva and vagina: Secondary | ICD-10-CM

## 2018-06-14 DIAGNOSIS — B373 Candidiasis of vulva and vagina: Secondary | ICD-10-CM

## 2018-06-14 DIAGNOSIS — Z96641 Presence of right artificial hip joint: Secondary | ICD-10-CM

## 2018-06-14 MED ORDER — FLUCONAZOLE 150 MG PO TABS: 150.0000 mg | ORAL_TABLET | Freq: Every day | ORAL | 0 refills | Status: DC

## 2018-06-14 NOTE — Progress Notes (Signed)
HPI Sylvia Davies returns today now 43 days status post right total hip arthroplasty.  She is overall doing well.  She has had some problems with yeast due to antibiotics.  She states that her hip is overall healed with well in regards to the incision.  She said no chest pain shortness of breath fevers chills.  She has had some increased urination but no dysuria.  Physical exam: General well-developed well-nourished female no acute distress mood affect appropriate Right hip good range of motion without pain calf supple nontender.  Dorsiflexion plantarflexion ankle intact.  Impression: Status post right total hip arthroplasty  Plan: We will see her back in 6 months that time obtain AP pelvis and lateral view of the right hip.  She will follow-up sooner if there is any questions or concerns.  Questions were encouraged and answered at length today.

## 2018-06-15 ENCOUNTER — Encounter: Payer: Self-pay | Admitting: Family Medicine

## 2018-06-15 ENCOUNTER — Ambulatory Visit: Payer: BC Managed Care – PPO | Admitting: Family Medicine

## 2018-06-15 ENCOUNTER — Other Ambulatory Visit: Payer: BC Managed Care – PPO

## 2018-06-15 VITALS — BP 136/82 | HR 92 | Temp 98.0°F | Ht 62.0 in | Wt 183.0 lb

## 2018-06-15 DIAGNOSIS — R350 Frequency of micturition: Secondary | ICD-10-CM | POA: Diagnosis not present

## 2018-06-15 LAB — POC URINALSYSI DIPSTICK (AUTOMATED)
Bilirubin, UA: NEGATIVE
GLUCOSE UA: NEGATIVE
Ketones, UA: NEGATIVE
Leukocytes, UA: NEGATIVE
NITRITE UA: NEGATIVE
Protein, UA: NEGATIVE
RBC UA: NEGATIVE
UROBILINOGEN UA: 0.2 U/dL
pH, UA: 6 (ref 5.0–8.0)

## 2018-06-15 NOTE — Progress Notes (Signed)
Patient ID: Sylvia Davies, female   DOB: July 16, 1960, 57 y.o.   MRN: [id number]    PCP: Biagio Borg, MD  Subjective:  Sylvia Davies is a 57 y.o. year old very pleasant female patient who presents with Urinary Tract symptoms: symptoms including frequency and urgency. -started: approximately 4 weeks , symptoms are not worsening nor improving.  Symptoms noticed after having a catheter while in the hospital for a right hip replacement and would like to have this checked -previous treatments:  No treatments have been tried at home.  -She reports drinking at least 2 to 3 cups of coffee in the morning and states she drinks water "constantly." She reports at least 9 bottles of water per day. States that her urine is pale yellow or clear.  No history of DM. BMP on 01/19/18 and 04/19/18 was WNL. Her BMP on 04/29/18, glucose was mildly elevated however this was not fasting per patient.   ROS-denies fever, chills, sweats, N/V, flank pain, or blood in urine  Pertinent Past Medical History- HTN,   Medications- reviewed  Current Outpatient Medications  Medication Sig Dispense Refill  . amLODipine (NORVASC) 5 MG tablet Take 1 tablet (5 mg total) by mouth daily. 90 tablet 3  . aspirin 81 MG chewable tablet Chew 1 tablet (81 mg total) by mouth 2 (two) times daily. 30 tablet 0  . atorvastatin (LIPITOR) 10 MG tablet TAKE 1 TABLET (10 MG TOTAL) BY MOUTH DAILY AT 6 PM. (Patient taking differently: Take 10 mg by mouth daily at 6 PM. TAKE 1 TABLET (10 MG TOTAL) BY MOUTH DAILY AT 6 PM.) 90 tablet 3  . cetirizine (ZYRTEC) 10 MG tablet Take 10 mg by mouth daily as needed for allergies.    [name] doxycycline (VIBRAMYCIN) 100 MG capsule TAKE ONE CAPSULE WITH BREAKFAST AND ONE CAPSULE WITH DINNER 4 capsule 0  . fluconazole (DIFLUCAN) 150 MG tablet Take 1 tablet (150 mg total) by mouth daily. 2 tablet 0  . methocarbamol (ROBAXIN) 500 MG tablet Take 1 tablet (500 mg total) by mouth every 6 (six) hours as needed for muscle  spasms. 40 tablet 0  . Multiple Vitamins-Minerals (MULTIVITAMIN PO) Take 1 tablet by mouth daily.    . ondansetron (ZOFRAN ODT) 4 MG disintegrating tablet Take 1 tablet (4 mg total) by mouth every 8 (eight) hours as needed for nausea or vomiting. 20 tablet 0  . traMADol (ULTRAM) 50 MG tablet Take 1-2 tablets (50-100 mg total) by mouth every 6 (six) hours as needed for moderate pain. 40 tablet 0  . Vitamin D, Ergocalciferol, (DRISDOL) 50000 units CAPS capsule TAKE 1 CAPSULE (50,000 UNITS TOTAL) BY MOUTH EVERY 7 (SEVEN) DAYS. (Patient taking differently: Take 50,000 Units by mouth every Saturday. ) 12 capsule 0   No current facility-administered medications for this visit.     Objective: BP 136/82 (BP Location: Left Arm, Patient Position: Sitting, Cuff Size: Normal)   Pulse 92   Temp 98 F (36.7 C) (Oral)   Ht 5\' 2"  (1.575 m)   Wt 183 lb (83 kg)   LMP 07/31/2013   SpO2 97%   BMI 33.47 kg/m  Gen: NAD, resting comfortably HEENT: oropharynx is clear and moist CV: RRR no murmurs rubs or gallops Lungs: CTAB no crackles, wheeze, rhonchi Abdomen: soft/nontender/nondistended/normal bowel sounds. No rebound or guarding.  No CVA tenderness.   Suprapubic tenderness not present Ext: no edema Skin: warm, dry, no rash Neuro: grossly normal, moves all extremities  Assessment/Plan: 1. Urinary frequency  UA is unremarkable. Suspect that symptoms are likely due to increased caffeine and water intake. She is not experiencing pain with urination or blood in urine. With history of catheter use in hospital will send for culture today. Further evaluation and treatment will be determined by UA results.   Advised patient to follow up if her symptoms do not improve in 2 to 3 days, worsen, she develops a fever >101, or back pain. She will also decrease caffeine intake and drink to thirst and avoid pushing extra fluids.    Finally, we reviewed reasons to return to care including if symptoms worsen or persist  or new concerns arise- once again particularly fever, N/V, or flank pain.    Laurita Quint, FNP

## 2018-06-15 NOTE — Addendum Note (Signed)
Addended by: Delano Metz A on: 06/15/2018 04:23 PM   Modules accepted: Orders

## 2018-06-16 LAB — URINE CULTURE
MICRO NUMBER:: 91458010
SPECIMEN QUALITY: ADEQUATE

## 2018-07-24 ENCOUNTER — Other Ambulatory Visit (INDEPENDENT_AMBULATORY_CARE_PROVIDER_SITE_OTHER): Payer: Self-pay | Admitting: Orthopaedic Surgery

## 2018-07-24 NOTE — Telephone Encounter (Signed)
Please advise 

## 2018-07-27 ENCOUNTER — Telehealth (INDEPENDENT_AMBULATORY_CARE_PROVIDER_SITE_OTHER): Payer: Self-pay | Admitting: Orthopaedic Surgery

## 2018-07-27 NOTE — Telephone Encounter (Signed)
Can you do me a favor and help me with this one?

## 2018-07-27 NOTE — Telephone Encounter (Signed)
Patient called stating that her RTW note needs to state that she can return without restrictions.  She would like to pick the note up when it is ready.  CB#[phone].  Thank you.

## 2018-07-28 ENCOUNTER — Encounter (INDEPENDENT_AMBULATORY_CARE_PROVIDER_SITE_OTHER): Payer: Self-pay

## 2018-07-28 NOTE — Telephone Encounter (Signed)
Patient aware.

## 2018-07-28 NOTE — Telephone Encounter (Signed)
Letter made. Ready for pick up.

## 2018-09-12 ENCOUNTER — Encounter: Payer: Self-pay | Admitting: Internal Medicine

## 2018-09-12 ENCOUNTER — Ambulatory Visit: Payer: BC Managed Care – PPO | Admitting: Internal Medicine

## 2018-09-12 VITALS — BP 138/88 | HR 90 | Temp 98.2°F | Ht 62.0 in | Wt 185.0 lb

## 2018-09-12 DIAGNOSIS — I10 Essential (primary) hypertension: Secondary | ICD-10-CM | POA: Diagnosis not present

## 2018-09-12 DIAGNOSIS — J069 Acute upper respiratory infection, unspecified: Secondary | ICD-10-CM | POA: Diagnosis not present

## 2018-09-12 DIAGNOSIS — R7302 Impaired glucose tolerance (oral): Secondary | ICD-10-CM

## 2018-09-12 MED ORDER — HYDROCODONE-HOMATROPINE 5-1.5 MG/5ML PO SYRP: 5.0000 mL | ORAL_SOLUTION | Freq: Four times a day (QID) | ORAL | 0 refills | Status: AC | PRN

## 2018-09-12 MED ORDER — AZITHROMYCIN 250 MG PO TABS: ORAL_TABLET | ORAL | 1 refills | Status: DC

## 2018-09-12 NOTE — Progress Notes (Signed)
Subjective:    Patient ID: Sylvia Davies, female    DOB: 1960/09/18, 58 y.o.   MRN: [id number]  HPI   Here with 2-3 days acute onset fever, facial pain, pressure, headache, general weakness and malaise, and greenish d/c, with mild ST and cough, but pt denies chest pain, wheezing, increased sob or doe, orthopnea, PND, increased LE swelling, palpitations, dizziness or syncope.  Pt denies new neurological symptoms such as new headache, or facial or extremity weakness or numbness   Pt denies polydipsia, polyuria Past Medical History:  Diagnosis Date  . ALLERGIC RHINITIS 04/02/2007  . Arthritis   . BREAST BIOPSY, HX OF 04/02/2007  . Diverticulosis   . FIBROCYSTIC BREAST DISEASE, HX OF 04/02/2007  . Hemorrhoids   . HYPERLIPIDEMIA 04/02/2007  . HYPERTENSION, BENIGN ESSENTIAL 05/04/2007  . Impaired glucose tolerance 11/08/2012  . Irritable bowel syndrome 04/02/2007  . MITRAL REGURGITATION, 0 (MILD) 04/02/2007  . OBESITY 04/02/2007   Past Surgical History:  Procedure Laterality Date  . BREAST BIOPSY Left   . BREAST EXCISIONAL BIOPSY Left    benign  . COLONOSCOPY    . DG 5TH DIGIT LEFT FOOT Left   . DILATATION & CURETTAGE/HYSTEROSCOPY WITH MYOSURE N/A 06/02/2016   Procedure: DILATATION & CURETTAGE/HYSTEROSCOPY WITH MYOSURE;  Surgeon: Thurnell Lose, MD;  Location: [facility];  Service: Gynecology;  Laterality: N/A;  Myosure - Endometrial Mass  . EYE SURGERY Left    tear duct  . JOINT REPLACEMENT     Right total hip Dr. Felicie Morn 04-28-18  . LEG SURGERY Left    have rods and srcews  . TOTAL HIP ARTHROPLASTY Right 04/28/2018   Procedure: RIGHT TOTAL HIP ARTHROPLASTY ANTERIOR APPROACH;  Surgeon: Mcarthur Rossetti, MD;  Location: [facility];  Service: Orthopedics;  Laterality: Right;    reports that she has never smoked. She has never used smokeless tobacco. She reports that she does not drink alcohol or use drugs. family history includes Diabetes in her maternal grandmother; Hypertension in her  mother. Allergies  Allergen Reactions  . Amlodipine Besy-Benazepril Hcl Itching    REACTION:sore throat  . Cleocin [Clindamycin Hcl] Itching  . Hyoscyamine   . Penicillins     Has patient had a PCN reaction causing immediate rash, facial/tongue/throat swelling, SOB or lightheadedness with hypotension: yes Has patient had a PCN reaction causing severe rash involving mucus membranes or skin necrosis: no Has patient had a PCN reaction that required hospitalization no Has patient had a PCN reaction occurring within the last 10 years: yes If all of the above answers are "NO", then may proceed with Cephalosporin use.   . Tramadol Hives  . Zylet [Loteprednol-Tobramycin] Itching   Current Outpatient Medications on File Prior to Visit  Medication Sig Dispense Refill  . amLODipine (NORVASC) 5 MG tablet Take 1 tablet (5 mg total) by mouth daily. 90 tablet 3  . aspirin 81 MG chewable tablet Chew 1 tablet (81 mg total) by mouth 2 (two) times daily. 30 tablet 0  . atorvastatin (LIPITOR) 10 MG tablet TAKE 1 TABLET (10 MG TOTAL) BY MOUTH DAILY AT 6 PM. (Patient taking differently: Take 10 mg by mouth daily at 6 PM. TAKE 1 TABLET (10 MG TOTAL) BY MOUTH DAILY AT 6 PM.) 90 tablet 3  . cetirizine (ZYRTEC) 10 MG tablet Take 10 mg by mouth daily as needed for allergies.    [name] doxycycline (VIBRAMYCIN) 100 MG capsule TAKE ONE CAPSULE WITH BREAKFAST AND ONE CAPSULE WITH DINNER 4 capsule 0  .  fluconazole (DIFLUCAN) 150 MG tablet Take 1 tablet (150 mg total) by mouth daily. 2 tablet 0  . methocarbamol (ROBAXIN) 500 MG tablet Take 1 tablet (500 mg total) by mouth every 6 (six) hours as needed for muscle spasms. 40 tablet 0  . Multiple Vitamins-Minerals (MULTIVITAMIN PO) Take 1 tablet by mouth daily.    . ondansetron (ZOFRAN ODT) 4 MG disintegrating tablet Take 1 tablet (4 mg total) by mouth every 8 (eight) hours as needed for nausea or vomiting. 20 tablet 0  . traMADol (ULTRAM) 50 MG tablet Take 1-2 tablets (50-100  mg total) by mouth every 6 (six) hours as needed for moderate pain. 40 tablet 0  . Vitamin D, Ergocalciferol, (DRISDOL) 50000 units CAPS capsule TAKE 1 CAPSULE (50,000 UNITS TOTAL) BY MOUTH EVERY 7 (SEVEN) DAYS. (Patient taking differently: Take 50,000 Units by mouth every Saturday. ) 12 capsule 0   No current facility-administered medications on file prior to visit.    Review of Systems  Constitutional: Negative for other unusual diaphoresis or sweats HENT: Negative for ear discharge or swelling Eyes: Negative for other worsening visual disturbances Respiratory: Negative for stridor or other swelling  Gastrointestinal: Negative for worsening distension or other blood Genitourinary: Negative for retention or other urinary change Musculoskeletal: Negative for other MSK pain or swelling Skin: Negative for color change or other new lesions Neurological: Negative for worsening tremors and other numbness  Psychiatric/Behavioral: Negative for worsening agitation or other fatigue All other system neg per pt    Objective:   Physical Exam BP 138/88   Pulse 90   Temp 98.2 F (36.8 C) (Oral)   Ht 5\' 2"  (1.575 m)   Wt 185 lb (83.9 kg)   LMP 07/31/2013   SpO2 96%   BMI 33.84 kg/m  VS noted, mild ill Constitutional: Pt appears in NAD HENT: Head: NCAT.  Right Ear: External ear normal.  Left Ear: External ear normal.  Eyes: . Pupils are equal, round, and reactive to light. Conjunctivae and EOM are normal Bilat tm's with mild erythema.  Max sinus areas mild tender.  Pharynx with mild erythema, no exudate Nose: without d/c or deformity Neck: Neck supple. Gross normal ROM Cardiovascular: Normal rate and regular rhythm.   Pulmonary/Chest: Effort normal and breath sounds without rales or wheezing.  Neurological: Pt is alert. At baseline orientation, motor grossly intact Skin: Skin is warm. No rashes, other new lesions, no LE edema Psychiatric: Pt behavior is normal without agitation  No other  exam findings Lab Results  Component Value Date   WBC 14.7 (H) 04/29/2018   HGB 11.9 (L) 04/29/2018   HCT 38.5 04/29/2018   PLT 195 04/29/2018   GLUCOSE 131 (H) 04/29/2018   CHOL 144 01/19/2018   TRIG 75.0 01/19/2018   HDL 53.60 01/19/2018   LDLDIRECT 144.4 10/16/2012   LDLCALC 75 01/19/2018   ALT 14 01/19/2018   AST 17 01/19/2018   NA 138 04/29/2018   K 4.1 04/29/2018   CL 106 04/29/2018   CREATININE 0.46 04/29/2018   BUN 7 04/29/2018   CO2 23 04/29/2018   TSH 0.87 01/19/2018   HGBA1C 5.8 01/17/2017          Assessment & Plan:

## 2018-09-12 NOTE — Patient Instructions (Signed)
Please take all new medication as prescribed - the antibiotic, and cough medicine as needed  Please continue all other medications as before, and refills have been done if requested.  Please have the pharmacy call with any other refills you may need.  Please keep your appointments with your specialists as you may have planned   

## 2018-09-12 NOTE — Assessment & Plan Note (Signed)
Mild to mod, for antibx course,  to f/u any worsening symptoms or concerns 

## 2018-09-12 NOTE — Assessment & Plan Note (Signed)
stable overall by history and exam, recent data reviewed with pt, and pt to continue medical treatment as before,  to f/u any worsening symptoms or concerns  

## 2018-09-13 ENCOUNTER — Other Ambulatory Visit: Payer: Self-pay | Admitting: Internal Medicine

## 2018-09-26 ENCOUNTER — Ambulatory Visit: Payer: Self-pay

## 2018-09-26 NOTE — Telephone Encounter (Signed)
Returned call to patient who states that she was treated with antibiotic last week for symptoms of cough and sore throat.  She states she used a Zpac and completed does last Thursday 3/12.  She states that her cough has improved but her throat is painful and scratchy.  She rates the discomfort at 7.  She states it is painful to eat.  She is hoarse. She denies fever. She has no breathing issues. Appointment scheduled per protocol.  Care advice read to patient. Pt verbalized understanding of all instructions. Pt is aware that Zpac will work  10 days after last dose of medication.  Reason for Disposition . [1] Sore throat with cough/cold symptoms AND [2] present > 5 days  Answer Assessment - Initial Assessment Questions 1. ONSET: "When did the throat start hurting?" (Hours or days ago)      2 day ago finishe antibiotic a week ago 2. SEVERITY: "How bad is the sore throat?" (Scale 1-10; mild, moderate or severe)   - MILD (1-3):  doesn't interfere with eating or normal activities   - MODERATE (4-7): interferes with eating some solids and normal activities   - SEVERE (8-10):  excruciating pain, interferes with most normal activities   - SEVERE DYSPHAGIA: can't swallow liquids, drooling     Moderate 3. STREP EXPOSURE: "Has there been any exposure to strep within the past week?" If so, ask: "What type of contact occurred?"      No strep 4.  VIRAL SYMPTOMS: "Are there any symptoms of a cold, such as a runny nose, cough, hoarse voice or red eyes?"      Allergies runny nose cough hoarse 5. FEVER: "Do you have a fever?" If so, ask: "What is your temperature, how was it measured, and when did it start?"    No 6. PUS ON THE TONSILS: "Is there pus on the tonsils in the back of your throat?"     No 7. OTHER SYMPTOMS: "Do you have any other symptoms?" (e.g., difficulty breathing, headache, rash)    No 8. PREGNANCY: "Is there any chance you are pregnant?" "When was your last menstrual period?"   N/A  Protocols used: SORE THROAT-A-AH

## 2018-09-28 ENCOUNTER — Other Ambulatory Visit: Payer: Self-pay

## 2018-09-28 ENCOUNTER — Encounter: Payer: Self-pay | Admitting: Internal Medicine

## 2018-09-28 ENCOUNTER — Ambulatory Visit: Payer: BC Managed Care – PPO | Admitting: Internal Medicine

## 2018-09-28 DIAGNOSIS — J069 Acute upper respiratory infection, unspecified: Secondary | ICD-10-CM | POA: Diagnosis not present

## 2018-09-28 MED ORDER — FLUCONAZOLE 150 MG PO TABS: 150.0000 mg | ORAL_TABLET | Freq: Once | ORAL | 0 refills | Status: AC

## 2018-09-28 NOTE — Progress Notes (Signed)
   Subjective:   Patient ID: Sylvia Davies, female    DOB: 1961/05/26, 58 y.o.   MRN: [id number]  HPI The patient is a 58 y.o. female coming in for cold symptoms.Seen by PCP for URI on 09/12/18 and given cough syrup and z-pack with refill. Started around the end of February initially. Has been relatively stable since that time. She denies much improvement with the z-packs but took both of them. Stopped about 3-4 days ago. Denies fevers or chills. Main symptoms are: cough, sore throat. Denies body aches, sinus pressure. She denies sinus pressure. She does have drainage. Overall it is stable. Has tried cough medicine and z-pack. Also is thinking she has a yeast infection starting.   Review of Systems  Constitutional: Negative for activity change, appetite change, chills, fatigue, fever and unexpected weight change.  HENT: Positive for congestion, postnasal drip and rhinorrhea. Negative for ear discharge, ear pain, sinus pressure, sinus pain, sneezing, sore throat, tinnitus, trouble swallowing and voice change.   Eyes: Negative.   Respiratory: Positive for cough. Negative for chest tightness, shortness of breath and wheezing.   Cardiovascular: Negative.   Gastrointestinal: Negative.   Musculoskeletal: Negative.  Negative for myalgias.  Neurological: Negative.     Objective:  Physical Exam Constitutional:      Appearance: She is well-developed.  HENT:     Head: Normocephalic and atraumatic.     Comments: Oropharynx with redness and clear drainage, nose with swollen turbinates, TMs normal bilaterally.  Neck:     Musculoskeletal: Normal range of motion.     Thyroid: No thyromegaly.  Cardiovascular:     Rate and Rhythm: Normal rate and regular rhythm.  Pulmonary:     Effort: Pulmonary effort is normal. No respiratory distress.     Breath sounds: Normal breath sounds. No wheezing or rales.  Abdominal:     Palpations: Abdomen is soft.  Musculoskeletal:        General: Tenderness present.   Lymphadenopathy:     Cervical: No cervical adenopathy.  Skin:    General: Skin is warm and dry.  Neurological:     Mental Status: She is alert and oriented to person, place, and time.     Vitals:   09/28/18 1557  BP: 138/80  Pulse: 72  Temp: 98 F (36.7 C)  TempSrc: Oral  SpO2: 98%  Weight: 186 lb (84.4 kg)  Height: 5\' 2"  (1.575 m)    Assessment & Plan:

## 2018-09-28 NOTE — Patient Instructions (Signed)
We have sent in the diflucan for the yeast infection.   We would like you to take zyrtec (cetirizine) over the counter daily to help with the drainage and cough.

## 2018-09-28 NOTE — Assessment & Plan Note (Signed)
No further antibiotics indicated at this time. Rx for diflucan today. Advised to start taking zyrtec which she was not taking.

## 2018-10-12 ENCOUNTER — Other Ambulatory Visit: Payer: Self-pay | Admitting: Nurse Practitioner

## 2018-10-12 DIAGNOSIS — Z1231 Encounter for screening mammogram for malignant neoplasm of breast: Secondary | ICD-10-CM

## 2018-11-16 ENCOUNTER — Encounter: Payer: Self-pay | Admitting: Podiatry

## 2018-11-16 ENCOUNTER — Ambulatory Visit (INDEPENDENT_AMBULATORY_CARE_PROVIDER_SITE_OTHER): Payer: BC Managed Care – PPO

## 2018-11-16 ENCOUNTER — Other Ambulatory Visit: Payer: Self-pay

## 2018-11-16 ENCOUNTER — Ambulatory Visit (INDEPENDENT_AMBULATORY_CARE_PROVIDER_SITE_OTHER): Payer: BC Managed Care – PPO | Admitting: Podiatry

## 2018-11-16 VITALS — BP 118/72 | HR 75

## 2018-11-16 DIAGNOSIS — M722 Plantar fascial fibromatosis: Secondary | ICD-10-CM | POA: Diagnosis not present

## 2018-11-16 DIAGNOSIS — M7661 Achilles tendinitis, right leg: Secondary | ICD-10-CM

## 2018-11-16 DIAGNOSIS — M7662 Achilles tendinitis, left leg: Secondary | ICD-10-CM | POA: Diagnosis not present

## 2018-11-16 MED ORDER — DICLOFENAC SODIUM 75 MG PO TBEC: 75.0000 mg | DELAYED_RELEASE_TABLET | Freq: Two times a day (BID) | ORAL | 2 refills | Status: DC

## 2018-11-16 MED ORDER — TRIAMCINOLONE ACETONIDE 10 MG/ML IJ SUSP
10.0000 mg | Freq: Once | INTRAMUSCULAR | Status: AC
  Administered 2018-11-16: 10 mg

## 2018-11-16 NOTE — Patient Instructions (Signed)

## 2018-11-16 NOTE — Progress Notes (Signed)
Subjective:   Patient ID: Sylvia Davies, female   DOB: 58 y.o.   MRN: [id number]   HPI Patient presents stating she is developed a lot of pain in the bottom of both heels and is become very very debilitating for her.  Patient states it started about 3 months ago and she is not sure why and it is become increasingly sore for her to be active with and states it is become more more of a problem for her.   ROS      Objective:  Physical Exam  Neurovascular status intact with acute plantar fascial symptomatology bilateral that are very painful when pressed and making walking difficult with fluid buildup at the insertion to the calcaneus bilateral     Assessment:  Acute plantar fasciitis bilateral with inflammation     Plan:  H&P conditions reviewed and today I reviewed x-rays.  I did sterile prep and injected the fascia at its insertion 3 mg Kenalog 5 mg Xylocaine and applied fascial brace bilateral with all instructions on usage.  Patient will be seen back to recheck again in 2 weeks or earlier if needed and placed on diclofenac 75 mg twice daily  X-rays indicate spur formation plantar aspect heel region bilateral with moderate depression of the arch and no indication of stress fracture

## 2018-11-30 ENCOUNTER — Encounter: Payer: Self-pay | Admitting: Podiatry

## 2018-11-30 ENCOUNTER — Other Ambulatory Visit: Payer: Self-pay

## 2018-11-30 ENCOUNTER — Ambulatory Visit (INDEPENDENT_AMBULATORY_CARE_PROVIDER_SITE_OTHER): Payer: BC Managed Care – PPO | Admitting: Podiatry

## 2018-11-30 VITALS — Temp 97.7°F

## 2018-11-30 DIAGNOSIS — M7662 Achilles tendinitis, left leg: Secondary | ICD-10-CM

## 2018-11-30 DIAGNOSIS — M7661 Achilles tendinitis, right leg: Secondary | ICD-10-CM

## 2018-11-30 DIAGNOSIS — M722 Plantar fascial fibromatosis: Secondary | ICD-10-CM

## 2018-11-30 NOTE — Progress Notes (Signed)
Subjective:   Patient ID: Sylvia Davies, female   DOB: 57 y.o.   MRN: [id number]   HPI Patient states overall and feeling pretty good with diminished discomfort and the braces are very helpful for me and I brought my orthotics and   ROS      Objective:  Physical Exam  X-rays indicate spurs with depression of the arch and I did note diminishment of discomfort of the plantar fascial bilateral with patient found to have mild pain upon palpation     Assessment:  Doing better with plantar fasciitis bilateral     Plan:  I advised this patient on continuation of supportive shoe physical therapy and brace usage.  Discussed continued orthotic usage and the may make her  Point in future but at this point were going to watch this and decide what else may be appropriate

## 2018-12-05 ENCOUNTER — Other Ambulatory Visit: Payer: Self-pay | Admitting: Internal Medicine

## 2018-12-11 ENCOUNTER — Ambulatory Visit: Payer: Self-pay | Admitting: Physician Assistant

## 2018-12-14 ENCOUNTER — Other Ambulatory Visit (HOSPITAL_COMMUNITY)
Admission: RE | Admit: 2018-12-14 | Discharge: 2018-12-14 | Disposition: A | Discharge status: Home / Self Care | Payer: BC Managed Care – PPO | Source: Ambulatory Visit | Attending: Nurse Practitioner | Admitting: Nurse Practitioner

## 2018-12-14 ENCOUNTER — Other Ambulatory Visit: Payer: Self-pay | Admitting: Nurse Practitioner

## 2018-12-14 DIAGNOSIS — Z01419 Encounter for gynecological examination (general) (routine) without abnormal findings: Secondary | ICD-10-CM | POA: Insufficient documentation

## 2018-12-15 ENCOUNTER — Ambulatory Visit
Admission: RE | Admit: 2018-12-15 | Discharge: 2018-12-15 | Disposition: A | Discharge status: Home / Self Care | Payer: BC Managed Care – PPO | Source: Ambulatory Visit | Attending: Nurse Practitioner | Admitting: Nurse Practitioner

## 2018-12-15 ENCOUNTER — Other Ambulatory Visit: Payer: Self-pay

## 2018-12-15 DIAGNOSIS — Z1231 Encounter for screening mammogram for malignant neoplasm of breast: Secondary | ICD-10-CM

## 2018-12-18 ENCOUNTER — Ambulatory Visit: Payer: Self-pay

## 2018-12-18 ENCOUNTER — Encounter: Payer: Self-pay | Admitting: Physician Assistant

## 2018-12-18 ENCOUNTER — Ambulatory Visit (INDEPENDENT_AMBULATORY_CARE_PROVIDER_SITE_OTHER): Payer: BC Managed Care – PPO | Admitting: Physician Assistant

## 2018-12-18 ENCOUNTER — Other Ambulatory Visit: Payer: Self-pay

## 2018-12-18 VITALS — Ht 62.0 in | Wt 189.0 lb

## 2018-12-18 DIAGNOSIS — Z96641 Presence of right artificial hip joint: Secondary | ICD-10-CM | POA: Diagnosis not present

## 2018-12-18 LAB — CYTOLOGY - PAP
Diagnosis: NEGATIVE
HPV: NOT DETECTED

## 2018-12-18 NOTE — Progress Notes (Signed)
HPI: Mrs. Tax returns today now 8 months status post right total hip arthroplasty.  She is overall doing very well.  She states her only problem is that nobody can keep up with her whenever she is walking and she is lost several walking partners due to this.  She has no complaints about the right hip.  She did have a fall on 10/25/2018 whenever she tripped over a rug but outside of that is doing quite well.  Review of systems: No fevers or chills.  Otherwise please see HPI.  Physical exam: General well-developed well-nourished female no acute distress mood and affect appropriate.  Psych alert and oriented x3.  Right hip: Good range of motion right hip without pain.  Patient ambulates without any assistive devices.  Radiographs:AP pelvis lateral view right hip: No acute fractures.  Total hip components are well-seated.  No other bony abnormalities.   Impression: Status post right total hip arthroplasty 04/29/2019  Plan: Patient will continue to work on range of motion strengthening.  At her request she would like to be seen back at 1 year postop.  No radiographs at that time unless clinically indicated.  Questions encouraged and answered.

## 2019-01-09 ENCOUNTER — Other Ambulatory Visit (INDEPENDENT_AMBULATORY_CARE_PROVIDER_SITE_OTHER): Payer: Self-pay | Admitting: Orthopaedic Surgery

## 2019-01-19 ENCOUNTER — Other Ambulatory Visit (INDEPENDENT_AMBULATORY_CARE_PROVIDER_SITE_OTHER): Payer: BC Managed Care – PPO

## 2019-01-19 DIAGNOSIS — Z0000 Encounter for general adult medical examination without abnormal findings: Secondary | ICD-10-CM

## 2019-01-19 LAB — BASIC METABOLIC PANEL
BUN: 10 mg/dL (ref 6–23)
CO2: 27 mEq/L (ref 19–32)
Calcium: 9 mg/dL (ref 8.4–10.5)
Chloride: 104 mEq/L (ref 96–112)
Creatinine, Ser: 0.54 mg/dL (ref 0.40–1.20)
GFR: 139.55 mL/min (ref >60.00)
Glucose, Bld: 90 mg/dL (ref 70–99)
Potassium: 3.9 mEq/L (ref 3.5–5.1)
Sodium: 139 mEq/L (ref 135–145)

## 2019-01-19 LAB — CBC WITH DIFFERENTIAL/PLATELET
Basophils Absolute: 0 10*3/uL (ref 0.0–0.1)
Basophils Relative: 0.7 % (ref 0.0–3.0)
Eosinophils Absolute: 0.2 10*3/uL (ref 0.0–0.7)
Eosinophils Relative: 3.4 % (ref 0.0–5.0)
HCT: 39 % (ref 36.0–46.0)
Hemoglobin: 12.9 g/dL (ref 12.0–15.0)
Lymphocytes Relative: 42.2 % (ref 12.0–46.0)
Lymphs Abs: 2.3 10*3/uL (ref 0.7–4.0)
MCHC: 33.1 g/dL (ref 30.0–36.0)
MCV: 81.6 fl (ref 78.0–100.0)
Monocytes Absolute: 0.4 10*3/uL (ref 0.1–1.0)
Monocytes Relative: 8 % (ref 3.0–12.0)
Neutro Abs: 2.5 10*3/uL (ref 1.4–7.7)
Neutrophils Relative %: 45.7 % (ref 43.0–77.0)
Platelets: 234 10*3/uL (ref 150.0–400.0)
RBC: 4.78 Mil/uL (ref 3.87–5.11)
RDW: 15.7 % — ABNORMAL HIGH (ref 11.5–15.5)
WBC: 5.4 10*3/uL (ref 4.0–10.5)

## 2019-01-19 LAB — URINALYSIS, ROUTINE W REFLEX MICROSCOPIC
Bilirubin Urine: NEGATIVE
Hgb urine dipstick: NEGATIVE
Ketones, ur: NEGATIVE
Leukocytes,Ua: NEGATIVE
Nitrite: NEGATIVE
Specific Gravity, Urine: 1.01 (ref 1.000–1.030)
Total Protein, Urine: NEGATIVE
Urine Glucose: NEGATIVE
Urobilinogen, UA: 0.2 (ref 0.0–1.0)
pH: 7 (ref 5.0–8.0)

## 2019-01-19 LAB — LIPID PANEL
Cholesterol: 149 mg/dL (ref 0–200)
HDL: 44.7 mg/dL (ref >39.00)
LDL Cholesterol: 77 mg/dL (ref 0–99)
NonHDL: 104.16
Total CHOL/HDL Ratio: 3
Triglycerides: 137 mg/dL (ref 0.0–149.0)
VLDL: 27.4 mg/dL (ref 0.0–40.0)

## 2019-01-19 LAB — HEPATIC FUNCTION PANEL
ALT: 18 U/L (ref 0–35)
AST: 19 U/L (ref 0–37)
Albumin: 4 g/dL (ref 3.5–5.2)
Alkaline Phosphatase: 123 U/L — ABNORMAL HIGH (ref 39–117)
Bilirubin, Direct: 0.1 mg/dL (ref 0.0–0.3)
Total Bilirubin: 0.5 mg/dL (ref 0.2–1.2)
Total Protein: 7.3 g/dL (ref 6.0–8.3)

## 2019-01-19 LAB — TSH: TSH: 1.13 u[IU]/mL (ref 0.35–4.50)

## 2019-01-22 ENCOUNTER — Ambulatory Visit (INDEPENDENT_AMBULATORY_CARE_PROVIDER_SITE_OTHER): Payer: BC Managed Care – PPO | Admitting: Internal Medicine

## 2019-01-22 ENCOUNTER — Encounter: Payer: Self-pay | Admitting: Internal Medicine

## 2019-01-22 ENCOUNTER — Other Ambulatory Visit: Payer: Self-pay

## 2019-01-22 ENCOUNTER — Other Ambulatory Visit: Payer: Self-pay | Admitting: Podiatry

## 2019-01-22 VITALS — BP 130/84 | HR 89 | Temp 98.7°F | Ht 62.0 in | Wt 195.0 lb

## 2019-01-22 DIAGNOSIS — R7302 Impaired glucose tolerance (oral): Secondary | ICD-10-CM | POA: Diagnosis not present

## 2019-01-22 DIAGNOSIS — E611 Iron deficiency: Secondary | ICD-10-CM | POA: Diagnosis not present

## 2019-01-22 DIAGNOSIS — E538 Deficiency of other specified B group vitamins: Secondary | ICD-10-CM

## 2019-01-22 DIAGNOSIS — Z0000 Encounter for general adult medical examination without abnormal findings: Secondary | ICD-10-CM | POA: Diagnosis not present

## 2019-01-22 DIAGNOSIS — E559 Vitamin D deficiency, unspecified: Secondary | ICD-10-CM

## 2019-01-22 MED ORDER — ATORVASTATIN CALCIUM 10 MG PO TABS: ORAL_TABLET | ORAL | 3 refills | Status: DC

## 2019-01-22 MED ORDER — AMLODIPINE BESYLATE 5 MG PO TABS: 5.0000 mg | ORAL_TABLET | Freq: Every day | ORAL | 3 refills | Status: DC

## 2019-01-22 NOTE — Assessment & Plan Note (Signed)
stable overall by history and exam, recent data reviewed with pt, and pt to continue medical treatment as before,  to f/u any worsening symptoms or concerns  

## 2019-01-22 NOTE — Progress Notes (Signed)
Subjective:    Patient ID: Sylvia Davies, female    DOB: 25-Sep-1960, 58 y.o.   MRN: [id number]  HPI  Here for wellness and f/u;  Overall doing ok;  Pt denies Chest pain, worsening SOB, DOE, wheezing, orthopnea, PND, worsening LE edema, palpitations, dizziness or syncope.  Pt denies neurological change such as new headache, facial or extremity weakness.  Pt denies polydipsia, polyuria, or low sugar symptoms. Pt states overall good compliance with treatment and medications, good tolerability, and has been trying to follow appropriate diet.  Pt denies worsening depressive symptoms, suicidal ideation or panic. No fever, night sweats, wt loss, loss of appetite, or other constitutional symptoms.  Pt states good ability with ADL's, has low fall risk, home safety reviewed and adequate, no other significant changes in hearing or vision, and only occasionally active with exercise.  S/p right hip surgury, but gained wt with pendemic, not working for 2 mo, now back to work June 15.   Wt Readings from Last 3 Encounters:  01/22/19 195 lb (88.5 kg)  12/18/18 189 lb (85.7 kg)  09/28/18 186 lb (84.4 kg)   Past Medical History:  Diagnosis Date  . ALLERGIC RHINITIS 04/02/2007  . Arthritis   . BREAST BIOPSY, HX OF 04/02/2007  . Diverticulosis   . FIBROCYSTIC BREAST DISEASE, HX OF 04/02/2007  . Hemorrhoids   . HYPERLIPIDEMIA 04/02/2007  . HYPERTENSION, BENIGN ESSENTIAL 05/04/2007  . Impaired glucose tolerance 11/08/2012  . Irritable bowel syndrome 04/02/2007  . MITRAL REGURGITATION, 0 (MILD) 04/02/2007  . OBESITY 04/02/2007   Past Surgical History:  Procedure Laterality Date  . BREAST BIOPSY Left   . BREAST EXCISIONAL BIOPSY Left    benign  . COLONOSCOPY    . DG 5TH DIGIT LEFT FOOT Left   . DILATATION & CURETTAGE/HYSTEROSCOPY WITH MYOSURE N/A 06/02/2016   Procedure: DILATATION & CURETTAGE/HYSTEROSCOPY WITH MYOSURE;  Surgeon: Thurnell Lose, MD;  Location: [facility];  Service: Gynecology;  Laterality: N/A;   Myosure - Endometrial Mass  . EYE SURGERY Left    tear duct  . JOINT REPLACEMENT     Right total hip Dr. Felicie Morn 04-28-18  . LEG SURGERY Left    have rods and srcews  . TOTAL HIP ARTHROPLASTY Right 04/28/2018   Procedure: RIGHT TOTAL HIP ARTHROPLASTY ANTERIOR APPROACH;  Surgeon: Mcarthur Rossetti, MD;  Location: [facility];  Service: Orthopedics;  Laterality: Right;    reports that she has never smoked. She has never used smokeless tobacco. She reports that she does not drink alcohol or use drugs. family history includes Diabetes in her maternal grandmother; Hypertension in her mother. Allergies  Allergen Reactions  . Amlodipine Besy-Benazepril Hcl Itching    REACTION:sore throat  . Cleocin [Clindamycin Hcl] Itching  . Hyoscyamine   . Penicillins     Has patient had a PCN reaction causing immediate rash, facial/tongue/throat swelling, SOB or lightheadedness with hypotension: yes Has patient had a PCN reaction causing severe rash involving mucus membranes or skin necrosis: no Has patient had a PCN reaction that required hospitalization no Has patient had a PCN reaction occurring within the last 10 years: yes If all of the above answers are "NO", then may proceed with Cephalosporin use.   . Tramadol Hives  . Zylet [Loteprednol-Tobramycin] Itching   Current Outpatient Medications on File Prior to Visit  Medication Sig Dispense Refill  . aspirin 81 MG chewable tablet Chew 1 tablet (81 mg total) by mouth 2 (two) times daily. 30 tablet 0  .  cetirizine (ZYRTEC) 10 MG tablet Take 10 mg by mouth daily as needed for allergies.    . methocarbamol (ROBAXIN) 500 MG tablet TAKE 1 TABLET (500 MG TOTAL) BY MOUTH EVERY 6 (SIX) HOURS AS NEEDED FOR MUSCLE SPASMS. 40 tablet 0  . Multiple Vitamins-Minerals (MULTIVITAMIN PO) Take 1 tablet by mouth daily.    . ondansetron (ZOFRAN ODT) 4 MG disintegrating tablet Take 1 tablet (4 mg total) by mouth every 8 (eight) hours as needed for nausea or vomiting.  20 tablet 0  . traMADol (ULTRAM) 50 MG tablet Take 1-2 tablets (50-100 mg total) by mouth every 6 (six) hours as needed for moderate pain. (Patient not taking: Reported on 12/18/2018) 40 tablet 0  . Vitamin D, Ergocalciferol, (DRISDOL) 50000 units CAPS capsule TAKE 1 CAPSULE (50,000 UNITS TOTAL) BY MOUTH EVERY 7 (SEVEN) DAYS. 12 capsule 0   No current facility-administered medications on file prior to visit.    Review of Systems Constitutional: Negative for other unusual diaphoresis, sweats, appetite or weight changes HENT: Negative for other worsening hearing loss, ear pain, facial swelling, mouth sores or neck stiffness.   Eyes: Negative for other worsening pain, redness or other visual disturbance.  Respiratory: Negative for other stridor or swelling Cardiovascular: Negative for other palpitations or other chest pain  Gastrointestinal: Negative for worsening diarrhea or loose stools, blood in stool, distention or other pain Genitourinary: Negative for hematuria, flank pain or other change in urine volume.  Musculoskeletal: Negative for myalgias or other joint swelling.  Skin: Negative for other color change, or other wound or worsening drainage.  Neurological: Negative for other syncope or numbness. Hematological: Negative for other adenopathy or swelling Psychiatric/Behavioral: Negative for hallucinations, other worsening agitation, SI, self-injury, or new decreased concentration All other system neg per pt    Objective:   Physical Exam BP 130/84   Pulse 89   Temp 98.7 F (37.1 C) (Oral)   Ht 5\' 2"  (1.575 m)   Wt 195 lb (88.5 kg)   LMP 07/31/2013   SpO2 96%   BMI 35.67 kg/m  VS noted,  Constitutional: Pt is oriented to person, place, and time. Appears well-developed and well-nourished, in no significant distress and comfortable Head: Normocephalic and atraumatic  Eyes: Conjunctivae and EOM are normal. Pupils are equal, round, and reactive to light Right Ear: External ear normal  without discharge Left Ear: External ear normal without discharge Nose: Nose without discharge or deformity Mouth/Throat: Oropharynx is without other ulcerations and moist  Neck: Normal range of motion. Neck supple. No JVD present. No tracheal deviation present or significant neck LA or mass Cardiovascular: Normal rate, regular rhythm, normal heart sounds and intact distal pulses.   Pulmonary/Chest: WOB normal and breath sounds without rales or wheezing  Abdominal: Soft. Bowel sounds are normal. NT. No HSM  Musculoskeletal: Normal range of motion. Exhibits no edema Lymphadenopathy: Has no other cervical adenopathy.  Neurological: Pt is alert and oriented to person, place, and time. Pt has normal reflexes. No cranial nerve deficit. Motor grossly intact, Gait intact Skin: Skin is warm and dry. No rash noted or new ulcerations Psychiatric:  Has normal mood and affect. Behavior is normal without agitation No other exam findings  Lab Results  Component Value Date   WBC 5.4 01/19/2019   HGB 12.9 01/19/2019   HCT 39.0 01/19/2019   PLT 234.0 01/19/2019   GLUCOSE 90 01/19/2019   CHOL 149 01/19/2019   TRIG 137.0 01/19/2019   HDL 44.70 01/19/2019   LDLDIRECT 144.4 10/16/2012  LDLCALC 77 01/19/2019   ALT 18 01/19/2019   AST 19 01/19/2019   NA 139 01/19/2019   K 3.9 01/19/2019   CL 104 01/19/2019   CREATININE 0.54 01/19/2019   BUN 10 01/19/2019   CO2 27 01/19/2019   TSH 1.13 01/19/2019   HGBA1C 5.8 01/17/2017      Assessment & Plan:

## 2019-01-22 NOTE — Assessment & Plan Note (Signed)

## 2019-01-22 NOTE — Patient Instructions (Signed)
Please continue all other medications as before, and refills have been done if requested.  Please have the pharmacy call with any other refills you may need.  Please continue your efforts at being more active, low cholesterol diet, and weight control.  You are otherwise up to date with prevention measures today.  Please keep your appointments with your specialists as you may have planned  Please return in 1 year for your yearly visit, or sooner if needed, with Lab testing done 3-5 days before  

## 2019-02-06 ENCOUNTER — Encounter (HOSPITAL_COMMUNITY): Payer: Self-pay | Admitting: Emergency Medicine

## 2019-02-06 ENCOUNTER — Ambulatory Visit (HOSPITAL_COMMUNITY)
Admission: EM | Admit: 2019-02-06 | Discharge: 2019-02-06 | Disposition: A | Discharge status: Home / Self Care | Payer: BC Managed Care – PPO | Attending: Urgent Care | Admitting: Urgent Care

## 2019-02-06 DIAGNOSIS — S40862A Insect bite (nonvenomous) of left upper arm, initial encounter: Secondary | ICD-10-CM

## 2019-02-06 DIAGNOSIS — W57XXXA Bitten or stung by nonvenomous insect and other nonvenomous arthropods, initial encounter: Secondary | ICD-10-CM

## 2019-02-06 DIAGNOSIS — L259 Unspecified contact dermatitis, unspecified cause: Secondary | ICD-10-CM

## 2019-02-06 MED ORDER — BETAMETHASONE DIPROPIONATE 0.05 % EX OINT: TOPICAL_OINTMENT | Freq: Two times a day (BID) | CUTANEOUS | 0 refills | Status: DC

## 2019-02-06 MED ORDER — HYDROXYZINE HCL 25 MG PO TABS: 12.5000 mg | ORAL_TABLET | Freq: Three times a day (TID) | ORAL | 0 refills | Status: DC | PRN

## 2019-02-06 NOTE — ED Triage Notes (Signed)
Pt states yesterday she was walking in the woods in the dark and something stung her L upper arm. Large area of bruising and swelling noted to L upper arm.

## 2019-02-06 NOTE — ED Provider Notes (Signed)
MRN: [id number] DOB: Jun 02, 1961  Subjective:   Sylvia Davies is a 58 y.o. female presenting for 1 day history of left arm itching, redness and bruising.  Patient states that she was walking out in the woods and felt something sting the back of her left upper arm. Has not tried any medications for relief.   No current facility-administered medications for this encounter.   Current Outpatient Medications:  .  amLODipine (NORVASC) 5 MG tablet, Take 1 tablet (5 mg total) by mouth daily., Disp: 90 tablet, Rfl: 3 .  aspirin 81 MG chewable tablet, Chew 1 tablet (81 mg total) by mouth 2 (two) times daily., Disp: 30 tablet, Rfl: 0 .  atorvastatin (LIPITOR) 10 MG tablet, TAKE 1 TABLET BY MOUTH EVERY DAY AT 6PM, Disp: 90 tablet, Rfl: 3 .  cetirizine (ZYRTEC) 10 MG tablet, Take 10 mg by mouth daily as needed for allergies., Disp: , Rfl:  .  diclofenac (VOLTAREN) 75 MG EC tablet, TAKE 1 TABLET BY MOUTH TWICE A DAY, Disp: 50 tablet, Rfl: 2 .  methocarbamol (ROBAXIN) 500 MG tablet, TAKE 1 TABLET (500 MG TOTAL) BY MOUTH EVERY 6 (SIX) HOURS AS NEEDED FOR MUSCLE SPASMS., Disp: 40 tablet, Rfl: 0 .  Multiple Vitamins-Minerals (MULTIVITAMIN PO), Take 1 tablet by mouth daily., Disp: , Rfl:  .  ondansetron (ZOFRAN ODT) 4 MG disintegrating tablet, Take 1 tablet (4 mg total) by mouth every 8 (eight) hours as needed for nausea or vomiting., Disp: 20 tablet, Rfl: 0 .  traMADol (ULTRAM) 50 MG tablet, Take 1-2 tablets (50-100 mg total) by mouth every 6 (six) hours as needed for moderate pain. (Patient not taking: Reported on 12/18/2018), Disp: 40 tablet, Rfl: 0 .  Vitamin D, Ergocalciferol, (DRISDOL) 50000 units CAPS capsule, TAKE 1 CAPSULE (50,000 UNITS TOTAL) BY MOUTH EVERY 7 (SEVEN) DAYS., Disp: 12 capsule, Rfl: 0   Allergies  Allergen Reactions  . Amlodipine Besy-Benazepril Hcl Itching    REACTION:sore throat  . Cleocin [Clindamycin Hcl] Itching  . Hyoscyamine   . Penicillins     Has patient had a PCN reaction  causing immediate rash, facial/tongue/throat swelling, SOB or lightheadedness with hypotension: yes Has patient had a PCN reaction causing severe rash involving mucus membranes or skin necrosis: no Has patient had a PCN reaction that required hospitalization no Has patient had a PCN reaction occurring within the last 10 years: yes If all of the above answers are "NO", then may proceed with Cephalosporin use.   . Tramadol Hives  . Zylet [Loteprednol-Tobramycin] Itching    Past Medical History:  Diagnosis Date  . ALLERGIC RHINITIS 04/02/2007  . Arthritis   . BREAST BIOPSY, HX OF 04/02/2007  . Diverticulosis   . FIBROCYSTIC BREAST DISEASE, HX OF 04/02/2007  . Hemorrhoids   . HYPERLIPIDEMIA 04/02/2007  . HYPERTENSION, BENIGN ESSENTIAL 05/04/2007  . Impaired glucose tolerance 11/08/2012  . Irritable bowel syndrome 04/02/2007  . MITRAL REGURGITATION, 0 (MILD) 04/02/2007  . OBESITY 04/02/2007     Past Surgical History:  Procedure Laterality Date  . BREAST BIOPSY Left   . BREAST EXCISIONAL BIOPSY Left    benign  . COLONOSCOPY    . DG 5TH DIGIT LEFT FOOT Left   . DILATATION & CURETTAGE/HYSTEROSCOPY WITH MYOSURE N/A 06/02/2016   Procedure: DILATATION & CURETTAGE/HYSTEROSCOPY WITH MYOSURE;  Surgeon: Thurnell Lose, MD;  Location: [facility];  Service: Gynecology;  Laterality: N/A;  Myosure - Endometrial Mass  . EYE SURGERY Left    tear duct  . JOINT REPLACEMENT  Right total hip Dr. Felicie Morn 04-28-18  . LEG SURGERY Left    have rods and srcews  . TOTAL HIP ARTHROPLASTY Right 04/28/2018   Procedure: RIGHT TOTAL HIP ARTHROPLASTY ANTERIOR APPROACH;  Surgeon: Mcarthur Rossetti, MD;  Location: [facility];  Service: Orthopedics;  Laterality: Right;    ROS Denies fever, facial swelling, oral swelling, chest tightness, shortness of breath, nausea, vomiting, abdominal pain.  Denies drainage of pus from her wound.  Objective:   Vitals: BP 133/88   Pulse 84   Temp 98.2 F (36.8 C)   Resp 18    LMP 07/31/2013   SpO2 96%   Physical Exam Constitutional:      General: She is not in acute distress.    Appearance: Normal appearance. She is well-developed. She is not ill-appearing.  HENT:     Head: Normocephalic and atraumatic.     Nose: Nose normal.     Mouth/Throat:     Mouth: Mucous membranes are moist.     Pharynx: Oropharynx is clear.  Eyes:     General: No scleral icterus.    Extraocular Movements: Extraocular movements intact.     Pupils: Pupils are equal, round, and reactive to light.  Cardiovascular:     Rate and Rhythm: Normal rate.  Pulmonary:     Effort: Pulmonary effort is normal.  Skin:    General: Skin is warm and dry.       Neurological:     General: No focal deficit present.     Mental Status: She is alert and oriented to person, place, and time.  Psychiatric:        Mood and Affect: Mood normal.        Behavior: Behavior normal.     Assessment and Plan :   1. Insect bite of left upper arm, initial encounter   2. Contact dermatitis, unspecified contact dermatitis type, unspecified trigger     We will manage for contact dermatitis with topical betamethasone, Vistaril for itching. Counseled patient on potential for adverse effects with medications prescribed/recommended today, ER and return-to-clinic precautions discussed, patient verbalized understanding.   Jaynee Eagles, [place] 02/07/19 [phone]

## 2019-02-19 ENCOUNTER — Ambulatory Visit: Payer: BC Managed Care – PPO

## 2019-02-19 ENCOUNTER — Encounter: Payer: Self-pay | Admitting: Physician Assistant

## 2019-02-19 ENCOUNTER — Ambulatory Visit (INDEPENDENT_AMBULATORY_CARE_PROVIDER_SITE_OTHER): Payer: BC Managed Care – PPO | Admitting: Physician Assistant

## 2019-02-19 VITALS — Ht 62.0 in | Wt 195.0 lb

## 2019-02-19 DIAGNOSIS — M79605 Pain in left leg: Secondary | ICD-10-CM

## 2019-02-19 NOTE — Progress Notes (Signed)
Office Visit Note   Patient: Sylvia Davies           Date of Birth: 03-10-1961           MRN: [id number] Visit Date: 02/19/2019              Requested by: Biagio Borg, MD [address],  [place] [address] PCP: Biagio Borg, MD   Assessment & Plan: Visit Diagnoses:  1. Pain in left leg     Plan: She is shown on quad strengthening exercises.  She will begin taking Aleve 2 tablets twice daily with food.  Follow-up with [place] on as-needed basis pain persist or becomes worse.  Questions encouraged and answered.  Offered her a left knee injection.  She deferred the injection.  Follow-Up Instructions: Return if symptoms worsen or fail to improve.   Orders:  Orders Placed This Encounter  Procedures  . XR Tibia/Fibula Left   No orders of the defined types were placed in this encounter.     Procedures: No procedures performed   Clinical Data: No additional findings.   Subjective: Chief Complaint  Patient presents with  . Right Hip - Follow-up    S/p THA 04/28/18    HPI  Sylvia Davies is a 58 year old female comes in today with left leg pain.  She has history of IM nail.  No new injury to the left knee.  States she is having aching throbbing sensation mid left tibia.  No giving way, catching, locking or painful popping left knee.  Review of Systems See HPI  Objective: Vital Signs: Ht 5\' 2"  (1.575 m)   Wt 195 lb (88.5 kg)   LMP 07/31/2013   BMI 35.67 kg/m   Physical Exam Constitutional:      Appearance: She is not ill-appearing or diaphoretic.  Pulmonary:     Effort: Pulmonary effort is normal.  Neurological:     Mental Status: She is alert and oriented to person, place, and time.  Psychiatric:        Mood and Affect: Mood normal.     Ortho Exam Bilateral knees good range of motion without pain.  Left knee patellofemoral crepitus present.  No instability valgus varus stressing of either knee.  Osmond Sangeeta Youse's negative bilaterally.  No abnormal  warmth erythema or effusion of either knee.  Specialty Comments:  No specialty comments available.  Imaging: Xr Tibia/fibula Left  Result Date: 02/19/2019 Left tib-fib 2 views: No acute fracture.  Status post IM nailing without any hardware failure.  No migration of the IM nail.  Patellofemoral mild arthritic changes noted.  Tibia and proximal fibula fracture well-healed.  Left ankle talus well located within the ankle mortise no diastases.    PMFS History: Patient Active Problem List   Diagnosis Date Noted  . Acute upper respiratory infection 09/12/2018  . Status post total replacement of right hip 04/28/2018  . Unilateral primary osteoarthritis, right hip 03/20/2018  . Arthritis of right ankle 05/17/2017  . Stress fracture of right tibia 02/08/2017  . Right knee pain 01/18/2017  . Medication reaction 08/01/2014  . Rash and nonspecific skin eruption 01/06/2014  . Left knee pain 04/11/2013  . Hemorrhoids 12/20/2012  . Contact dermatitis 11/08/2012  . Impaired glucose tolerance 11/08/2012  . Preventative health care 06/29/2011  . LEG PAIN, BILATERAL 03/20/2009  . HYPERTENSION, BENIGN ESSENTIAL 05/04/2007  . Hyperlipidemia 04/02/2007  . OBESITY 04/02/2007  . MIGRAINE HEADACHE 04/02/2007  . MITRAL REGURGITATION, 0 (  MILD) 04/02/2007  . ALLERGIC RHINITIS 04/02/2007  . GERD 04/02/2007  . Irritable bowel syndrome 04/02/2007  . LOW BACK PAIN 04/02/2007  . FIBROCYSTIC BREAST DISEASE, HX OF 04/02/2007  . BREAST BIOPSY, HX OF 04/02/2007   Past Medical History:  Diagnosis Date  . ALLERGIC RHINITIS 04/02/2007  . Arthritis   . BREAST BIOPSY, HX OF 04/02/2007  . Diverticulosis   . FIBROCYSTIC BREAST DISEASE, HX OF 04/02/2007  . Hemorrhoids   . HYPERLIPIDEMIA 04/02/2007  . HYPERTENSION, BENIGN ESSENTIAL 05/04/2007  . Impaired glucose tolerance 11/08/2012  . Irritable bowel syndrome 04/02/2007  . MITRAL REGURGITATION, 0 (MILD) 04/02/2007  . OBESITY 04/02/2007    Family History   Problem Relation Age of Onset  . Hypertension Mother   . Diabetes Maternal Grandmother     Past Surgical History:  Procedure Laterality Date  . BREAST BIOPSY Left   . BREAST EXCISIONAL BIOPSY Left    benign  . COLONOSCOPY    . DG 5TH DIGIT LEFT FOOT Left   . DILATATION & CURETTAGE/HYSTEROSCOPY WITH MYOSURE N/A 06/02/2016   Procedure: DILATATION & CURETTAGE/HYSTEROSCOPY WITH MYOSURE;  Surgeon: Thurnell Lose, MD;  Location: [facility];  Service: Gynecology;  Laterality: N/A;  Myosure - Endometrial Mass  . EYE SURGERY Left    tear duct  . JOINT REPLACEMENT     Right total hip Dr. Felicie Morn 04-28-18  . LEG SURGERY Left    have rods and srcews  . TOTAL HIP ARTHROPLASTY Right 04/28/2018   Procedure: RIGHT TOTAL HIP ARTHROPLASTY ANTERIOR APPROACH;  Surgeon: Mcarthur Rossetti, MD;  Location: [facility];  Service: Orthopedics;  Laterality: Right;   Social History   Occupational History  . Occupation: Photographer: [employer]  Tobacco Use  . Smoking status: Never Smoker  . Smokeless tobacco: Never Used  Substance and Sexual Activity  . Alcohol use: No  . Drug use: No  . Sexual activity: Yes    Birth control/protection: Post-menopausal

## 2019-03-15 ENCOUNTER — Telehealth: Payer: Self-pay | Admitting: Internal Medicine

## 2019-03-15 NOTE — Telephone Encounter (Signed)
Copied from [place] [id number]. Topic: Appointment Scheduling - Transfer of Care >> Mar 15, 2019  8:00 AM Richardo Priest, NT wrote: Pt is requesting to transfer FROM: Dr.John/ [facility] Pt is requesting to transfer TO: Jodi Mourning Reason for requested transfer: Patient states she feels more comfortable with Jodi Mourning.   Send CRM to patient's current PCP (transferring FROM).  Dr.John are you okay with this? Jodi Mourning are you okay with this?

## 2019-03-15 NOTE — Telephone Encounter (Signed)
Ok with me 

## 2019-03-16 NOTE — Telephone Encounter (Signed)
LVM to confirm if patient is requesting Sylvia Davies.

## 2019-03-16 NOTE — Telephone Encounter (Addendum)
She saw me over a year ago. She saw Dr. Sharlet Salina most recently. I want to make sure she is not getting confused with who she is asking to see.

## 2019-03-20 NOTE — Telephone Encounter (Signed)
Request Jodi Mourning

## 2019-03-20 NOTE — Telephone Encounter (Signed)
Pt confirming request is for Sylvia Davies and when returning call about the [facility] please just leave a message confirming change so that she can let her insurance company know to have providers name changed on card.

## 2019-03-23 NOTE — Telephone Encounter (Signed)
Okay- she will need OV to meet with please; not emergent- can be in the next month or so.

## 2019-03-23 NOTE — Telephone Encounter (Signed)
LVM for patient to make an appt in about a month.

## 2019-04-23 ENCOUNTER — Ambulatory Visit: Payer: BC Managed Care – PPO | Admitting: Physician Assistant

## 2019-04-23 ENCOUNTER — Other Ambulatory Visit: Payer: Self-pay

## 2019-04-23 ENCOUNTER — Encounter: Payer: Self-pay | Admitting: Orthopaedic Surgery

## 2019-04-23 ENCOUNTER — Ambulatory Visit (INDEPENDENT_AMBULATORY_CARE_PROVIDER_SITE_OTHER): Payer: BC Managed Care – PPO | Admitting: Orthopaedic Surgery

## 2019-04-23 DIAGNOSIS — M7989 Other specified soft tissue disorders: Secondary | ICD-10-CM | POA: Diagnosis not present

## 2019-04-23 NOTE — Progress Notes (Signed)
The patient comes in today for evaluation treatment of leg swelling.  She actually has a history of a right total hip arthroplasty 12 months ago.  She said the hip is done very well for her.  She has been developing some swelling in her right foot but is been more of her right leg.  On examination she does have some slight pitting edema in both of her legs.  Neither foot or ankle are swollen today.  She has a strong palpable pulse in both feet with both dorsalis pedis and posterior tibial pulses especially on the right side which she feels is more swollen.  She has normal sensation her foot with excellent range of motion.  Again there is some slight pitting edema in both legs.  I have recommended compressive hose and I do feel that she should follow-up with her primary care physician to see if there is any metabolic issues or physiologic issues with her contributing to her bilateral lower extremity edema such as blood pressure issues or renal issues.  She understands this as well.  All question concerns were answered and addressed.  Follow-up as otherwise as needed.

## 2019-04-30 ENCOUNTER — Other Ambulatory Visit: Payer: Self-pay

## 2019-04-30 ENCOUNTER — Other Ambulatory Visit (INDEPENDENT_AMBULATORY_CARE_PROVIDER_SITE_OTHER): Payer: BC Managed Care – PPO

## 2019-04-30 ENCOUNTER — Ambulatory Visit (INDEPENDENT_AMBULATORY_CARE_PROVIDER_SITE_OTHER): Payer: BC Managed Care – PPO | Admitting: Family

## 2019-04-30 ENCOUNTER — Ambulatory Visit (INDEPENDENT_AMBULATORY_CARE_PROVIDER_SITE_OTHER)
Admission: RE | Admit: 2019-04-30 | Discharge: 2019-04-30 | Disposition: A | Discharge status: Home / Self Care | Payer: BC Managed Care – PPO | Source: Ambulatory Visit | Attending: Family | Admitting: Family

## 2019-04-30 ENCOUNTER — Encounter: Payer: Self-pay | Admitting: Family

## 2019-04-30 VITALS — BP 140/80 | HR 80 | Temp 98.6°F | Ht 62.0 in | Wt 192.0 lb

## 2019-04-30 DIAGNOSIS — R600 Localized edema: Secondary | ICD-10-CM

## 2019-04-30 DIAGNOSIS — I10 Essential (primary) hypertension: Secondary | ICD-10-CM | POA: Diagnosis not present

## 2019-04-30 LAB — BRAIN NATRIURETIC PEPTIDE: Pro B Natriuretic peptide (BNP): 29 pg/mL (ref 0.0–100.0)

## 2019-04-30 MED ORDER — HYDROCHLOROTHIAZIDE 25 MG PO TABS: 25.0000 mg | ORAL_TABLET | Freq: Every day | ORAL | 0 refills | Status: DC

## 2019-04-30 NOTE — Progress Notes (Signed)
Sylvia Davies is a 58 y.o. female with the following history as recorded in EpicCare:  Patient Active Problem List   Diagnosis Date Noted  . Acute upper respiratory infection 09/12/2018  . Status post total replacement of right hip 04/28/2018  . Unilateral primary osteoarthritis, right hip 03/20/2018  . Arthritis of right ankle 05/17/2017  . Stress fracture of right tibia 02/08/2017  . Right knee pain 01/18/2017  . Medication reaction 08/01/2014  . Rash and nonspecific skin eruption 01/06/2014  . Left knee pain 04/11/2013  . Hemorrhoids 12/20/2012  . Contact dermatitis 11/08/2012  . Impaired glucose tolerance 11/08/2012  . Preventative health care 06/29/2011  . LEG PAIN, BILATERAL 03/20/2009  . HYPERTENSION, BENIGN ESSENTIAL 05/04/2007  . Hyperlipidemia 04/02/2007  . OBESITY 04/02/2007  . MIGRAINE HEADACHE 04/02/2007  . MITRAL REGURGITATION, 0 (MILD) 04/02/2007  . ALLERGIC RHINITIS 04/02/2007  . GERD 04/02/2007  . Irritable bowel syndrome 04/02/2007  . LOW BACK PAIN 04/02/2007  . FIBROCYSTIC BREAST DISEASE, HX OF 04/02/2007  . BREAST BIOPSY, HX OF 04/02/2007    Current Outpatient Medications  Medication Sig Dispense Refill  . amLODipine (NORVASC) 5 MG tablet Take 1 tablet (5 mg total) by mouth daily. 90 tablet 3  . Ascorbic Acid (VITAMIN C) 100 MG tablet Take 100 mg by mouth daily.    [name] atorvastatin (LIPITOR) 10 MG tablet TAKE 1 TABLET BY MOUTH EVERY DAY AT 6PM 90 tablet 3  . cetirizine (ZYRTEC) 10 MG tablet Take 10 mg by mouth daily as needed for allergies.    . cholecalciferol (VITAMIN D3) 25 MCG (1000 UT) tablet Take 1,000 Units by mouth daily.    . Multiple Vitamins-Minerals (MULTIVITAMIN PO) Take 1 tablet by mouth daily.    . hydrochlorothiazide (HYDRODIURIL) 25 MG tablet Take 1 tablet (25 mg total) by mouth daily. 90 tablet 0   No current facility-administered medications for this visit.     Allergies: Amlodipine besy-benazepril hcl, Cleocin [clindamycin hcl],  Hyoscyamine, Penicillins, Tramadol, and Zylet [loteprednol-tobramycin]  Past Medical History:  Diagnosis Date  . ALLERGIC RHINITIS 04/02/2007  . Arthritis   . BREAST BIOPSY, HX OF 04/02/2007  . Diverticulosis   . FIBROCYSTIC BREAST DISEASE, HX OF 04/02/2007  . Hemorrhoids   . HYPERLIPIDEMIA 04/02/2007  . HYPERTENSION, BENIGN ESSENTIAL 05/04/2007  . Impaired glucose tolerance 11/08/2012  . Irritable bowel syndrome 04/02/2007  . MITRAL REGURGITATION, 0 (MILD) 04/02/2007  . OBESITY 04/02/2007    Past Surgical History:  Procedure Laterality Date  . BREAST BIOPSY Left   . BREAST EXCISIONAL BIOPSY Left    benign  . COLONOSCOPY    . DG 5TH DIGIT LEFT FOOT Left   . DILATATION & CURETTAGE/HYSTEROSCOPY WITH MYOSURE N/A 06/02/2016   Procedure: DILATATION & CURETTAGE/HYSTEROSCOPY WITH MYOSURE;  Surgeon: Thurnell Lose, MD;  Location: [facility];  Service: Gynecology;  Laterality: N/A;  Myosure - Endometrial Mass  . EYE SURGERY Left    tear duct  . JOINT REPLACEMENT     Right total hip Dr. Felicie Morn 04-28-18  . LEG SURGERY Left    have rods and srcews  . TOTAL HIP ARTHROPLASTY Right 04/28/2018   Procedure: RIGHT TOTAL HIP ARTHROPLASTY ANTERIOR APPROACH;  Surgeon: Mcarthur Rossetti, MD;  Location: [facility];  Service: Orthopedics;  Laterality: Right;    Family History  Problem Relation Age of Onset  . Hypertension Mother   . Diabetes Maternal Grandmother     Social History   Tobacco Use  . Smoking status: Never Smoker  . Smokeless  tobacco: Never Used  Substance Use Topics  . Alcohol use: No    Subjective:  1 year history of swelling in both legs; s/p right hip replacement in October 2019 and has done very well; has been on Amlodipine 5 mg for blood pressure control for at least the past 5 years; no cough, shortness of breath or wheezing; Denies any chest pain, shortness of breath, blurred vision or headache. Had CPE earlier this year and all labs done at that time were normal;     Objective:  Vitals:   04/30/19 1326  BP: 140/80  Pulse: 80  Temp: 98.6 F (37 C)  TempSrc: Oral  SpO2: 97%  Weight: 192 lb (87.1 kg)  Height: 5\' 2"  (1.575 m)    General: Well developed, well nourished, in no acute distress  Skin : Warm and dry.  Head: Normocephalic and atraumatic  Eyes: Sclera and conjunctiva clear; pupils round and reactive to light; extraocular movements intact  Ears: External normal; canals clear; tympanic membranes normal  Oropharynx: Pink, supple. No suspicious lesions  Neck: Supple without thyromegaly, adenopathy  Lungs: Respirations unlabored; clear to auscultation bilaterally without wheeze, rales, rhonchi  CVS exam: normal rate and regular rhythm.  Extremities: mild pitting edema bilaterally, cyanosis, clubbing  Vessels: Symmetric bilaterally  Neurologic: Alert and oriented; speech intact; face symmetrical; moves all extremities well; CNII-XII intact without focal deficit  Assessment:  1. Pedal edema   2. HYPERTENSION, BENIGN ESSENTIAL     Plan:  Will update CXR and BNP today; ? If symptoms due to uncontrolled blood pressure; will not change Amlodipine at this time as she has taken for many years with no complications; try adding HCTZ 25 mg daily; plan to follow-up in 3-4 weeks; if symptoms persist, will need to refer to cardiology;   No follow-ups on file.  Orders Placed This Encounter  Procedures  . DG Chest 2 View    Standing Status:   Future    Standing Expiration Date:   06/29/2020    Order Specific Question:   Reason for Exam (SYMPTOM  OR DIAGNOSIS REQUIRED)    Answer:   pedal edema    Order Specific Question:   Is patient pregnant?    Answer:   No    Order Specific Question:   Preferred imaging location?    Answer:   [facility]    Order Specific Question:   Radiology Contrast Protocol - do NOT remove file path    Answer:   [email or web address].pdf  . B Nat Peptide    Standing Status:   Future     Number of Occurrences:   1    Standing Expiration Date:   04/29/2020    Requested Prescriptions   Signed Prescriptions Disp Refills  . hydrochlorothiazide (HYDRODIURIL) 25 MG tablet 90 tablet 0    Sig: Take 1 tablet (25 mg total) by mouth daily.

## 2019-05-01 ENCOUNTER — Other Ambulatory Visit: Payer: Self-pay | Admitting: Family

## 2019-05-01 DIAGNOSIS — I119 Hypertensive heart disease without heart failure: Secondary | ICD-10-CM

## 2019-05-15 ENCOUNTER — Ambulatory Visit (INDEPENDENT_AMBULATORY_CARE_PROVIDER_SITE_OTHER): Payer: BC Managed Care – PPO | Admitting: Family

## 2019-05-15 ENCOUNTER — Other Ambulatory Visit: Payer: Self-pay

## 2019-05-15 ENCOUNTER — Encounter: Payer: Self-pay | Admitting: Family

## 2019-05-15 ENCOUNTER — Other Ambulatory Visit (INDEPENDENT_AMBULATORY_CARE_PROVIDER_SITE_OTHER): Payer: BC Managed Care – PPO

## 2019-05-15 VITALS — BP 130/84 | HR 70 | Temp 98.1°F | Ht 62.0 in | Wt 190.0 lb

## 2019-05-15 DIAGNOSIS — I10 Essential (primary) hypertension: Secondary | ICD-10-CM

## 2019-05-15 LAB — BASIC METABOLIC PANEL
BUN: 10 mg/dL (ref 6–23)
CO2: 27 mEq/L (ref 19–32)
Calcium: 9.5 mg/dL (ref 8.4–10.5)
Chloride: 103 mEq/L (ref 96–112)
Creatinine, Ser: 0.79 mg/dL (ref 0.40–1.20)
GFR: 89.86 mL/min (ref >60.00)
Glucose, Bld: 122 mg/dL — ABNORMAL HIGH (ref 70–99)
Potassium: 3.5 mEq/L (ref 3.5–5.1)
Sodium: 138 mEq/L (ref 135–145)

## 2019-05-15 MED ORDER — HYDROCHLOROTHIAZIDE 25 MG PO TABS: 25.0000 mg | ORAL_TABLET | Freq: Every day | ORAL | 3 refills | Status: DC

## 2019-05-15 NOTE — Progress Notes (Signed)
Sylvia Davies is a 58 y.o. female with the following history as recorded in EpicCare:  Patient Active Problem List   Diagnosis Date Noted  . Acute upper respiratory infection 09/12/2018  . Status post total replacement of right hip 04/28/2018  . Unilateral primary osteoarthritis, right hip 03/20/2018  . Arthritis of right ankle 05/17/2017  . Stress fracture of right tibia 02/08/2017  . Right knee pain 01/18/2017  . Medication reaction 08/01/2014  . Rash and nonspecific skin eruption 01/06/2014  . Left knee pain 04/11/2013  . Hemorrhoids 12/20/2012  . Contact dermatitis 11/08/2012  . Impaired glucose tolerance 11/08/2012  . Preventative health care 06/29/2011  . LEG PAIN, BILATERAL 03/20/2009  . HYPERTENSION, BENIGN ESSENTIAL 05/04/2007  . Hyperlipidemia 04/02/2007  . OBESITY 04/02/2007  . MIGRAINE HEADACHE 04/02/2007  . MITRAL REGURGITATION, 0 (MILD) 04/02/2007  . ALLERGIC RHINITIS 04/02/2007  . GERD 04/02/2007  . Irritable bowel syndrome 04/02/2007  . LOW BACK PAIN 04/02/2007  . FIBROCYSTIC BREAST DISEASE, HX OF 04/02/2007  . BREAST BIOPSY, HX OF 04/02/2007    Current Outpatient Medications  Medication Sig Dispense Refill  . amLODipine (NORVASC) 5 MG tablet Take 1 tablet (5 mg total) by mouth daily. 90 tablet 3  . Ascorbic Acid (VITAMIN C) 100 MG tablet Take 100 mg by mouth daily.    [name] atorvastatin (LIPITOR) 10 MG tablet TAKE 1 TABLET BY MOUTH EVERY DAY AT 6PM 90 tablet 3  . cetirizine (ZYRTEC) 10 MG tablet Take 10 mg by mouth daily as needed for allergies.    . cholecalciferol (VITAMIN D3) 25 MCG (1000 UT) tablet Take 1,000 Units by mouth daily.    . hydrochlorothiazide (HYDRODIURIL) 25 MG tablet Take 1 tablet (25 mg total) by mouth daily. 90 tablet 3  . Multiple Vitamins-Minerals (MULTIVITAMIN PO) Take 1 tablet by mouth daily.     No current facility-administered medications for this visit.     Allergies: Amlodipine besy-benazepril hcl, Cleocin [clindamycin hcl],  Hyoscyamine, Penicillins, Tramadol, and Zylet [loteprednol-tobramycin]  Past Medical History:  Diagnosis Date  . ALLERGIC RHINITIS 04/02/2007  . Arthritis   . BREAST BIOPSY, HX OF 04/02/2007  . Diverticulosis   . FIBROCYSTIC BREAST DISEASE, HX OF 04/02/2007  . Hemorrhoids   . HYPERLIPIDEMIA 04/02/2007  . HYPERTENSION, BENIGN ESSENTIAL 05/04/2007  . Impaired glucose tolerance 11/08/2012  . Irritable bowel syndrome 04/02/2007  . MITRAL REGURGITATION, 0 (MILD) 04/02/2007  . OBESITY 04/02/2007    Past Surgical History:  Procedure Laterality Date  . BREAST BIOPSY Left   . BREAST EXCISIONAL BIOPSY Left    benign  . COLONOSCOPY    . DG 5TH DIGIT LEFT FOOT Left   . DILATATION & CURETTAGE/HYSTEROSCOPY WITH MYOSURE N/A 06/02/2016   Procedure: DILATATION & CURETTAGE/HYSTEROSCOPY WITH MYOSURE;  Surgeon: Thurnell Lose, MD;  Location: [facility];  Service: Gynecology;  Laterality: N/A;  Myosure - Endometrial Mass  . EYE SURGERY Left    tear duct  . JOINT REPLACEMENT     Right total hip Dr. Felicie Morn 04-28-18  . LEG SURGERY Left    have rods and srcews  . TOTAL HIP ARTHROPLASTY Right 04/28/2018   Procedure: RIGHT TOTAL HIP ARTHROPLASTY ANTERIOR APPROACH;  Surgeon: Mcarthur Rossetti, MD;  Location: [facility];  Service: Orthopedics;  Laterality: Right;    Family History  Problem Relation Age of Onset  . Hypertension Mother   . Diabetes Maternal Grandmother     Social History   Tobacco Use  . Smoking status: Never Smoker  . Smokeless  tobacco: Never Used  Substance Use Topics  . Alcohol use: No    Subjective:  2 week follow-up on hypertension/ pedal edema; feeling much better with adding HCTZ 25 mg; legs are "not heavy"/ no concerns for leg cramps; Denies any chest pain, shortness of breath, blurred vision or headache.  Objective:  Vitals:   05/15/19 1009  BP: 130/84  Pulse: 70  Temp: 98.1 F (36.7 C)  TempSrc: Oral  SpO2: 96%  Weight: 190 lb (86.2 kg)  Height: 5\' 2"  (1.575 m)     General: Well developed, well nourished, in no acute distress  Skin : Warm and dry.  Head: Normocephalic and atraumatic  Eyes: Sclera and conjunctiva clear; pupils round and reactive to light; extraocular movements intact  Ears: External normal; canals clear; tympanic membranes normal  Oropharynx: Pink, supple. No suspicious lesions  Neck: Supple without thyromegaly, adenopathy  Lungs: Respirations unlabored; clear to auscultation bilaterally without wheeze, rales, rhonchi  CVS exam: normal rate and regular rhythm.  Extremities: No edema, cyanosis, clubbing  Vessels: Symmetric bilaterally  Neurologic: Alert and oriented; speech intact; face symmetrical; moves all extremities well; CNII-XII intact without focal deficit   Assessment:  1. Essential hypertension     Plan:  Stable- good improvement; continue same medication; keep planned follow-up with cardiology to evaluate cardiomegaly; check BMP today; follow-up in 3 months;   No follow-ups on file.  Orders Placed This Encounter  Procedures  . Basic Metabolic Panel (BMET)    Standing Status:   Future    Standing Expiration Date:   05/14/2020    Requested Prescriptions   Signed Prescriptions Disp Refills  . hydrochlorothiazide (HYDRODIURIL) 25 MG tablet 90 tablet 3    Sig: Take 1 tablet (25 mg total) by mouth daily.

## 2019-05-22 NOTE — Progress Notes (Signed)
Cardiology Office Note:   Date:  05/23/2019  NAME:  HELMI LASCALA    MRN: [id number] DOB:  11/28/1960   PCP:  [name], [place]  Cardiologist:  No primary care provider on file.  Electrophysiologist:  None   Referring MD: [name],*   Chief Complaint  Patient presents with  . cardiomegaly   History of Present Illness:   ZARLI BROOKSHIRE is a 58 y.o. female with a hx of hypertension who is being seen today for the evaluation of cardiomegly at the request of [name], [place].  She had a recent chest x-ray obtained for lower extremity edema.  The chest x-ray showed clear lungs but there was mention of cardiomegaly on the scan.  She reports she exercises routinely and has no limitations.  She has no symptoms of chest pain or chest pressure with exertion.  She reports since being started on HCTZ she is noticed that her lower extremities will swell.  Review of recent lab work shows LDL cholesterol 75, A1c 5.8, hemoglobin 1.9, serum creatinine 0.79.  She has no symptoms of orthopnea, PND, lower extremity edema.  She appears to be quite healthy.  She reports she can walk and has no limitations.  Past Medical History: Past Medical History:  Diagnosis Date  . ALLERGIC RHINITIS 04/02/2007  . Arthritis   . BREAST BIOPSY, HX OF 04/02/2007  . Diverticulosis   . FIBROCYSTIC BREAST DISEASE, HX OF 04/02/2007  . Hemorrhoids   . HYPERLIPIDEMIA 04/02/2007  . HYPERTENSION, BENIGN ESSENTIAL 05/04/2007  . Impaired glucose tolerance 11/08/2012  . Irritable bowel syndrome 04/02/2007  . MITRAL REGURGITATION, 0 (MILD) 04/02/2007  . OBESITY 04/02/2007    Past Surgical History: Past Surgical History:  Procedure Laterality Date  . BREAST BIOPSY Left   . BREAST EXCISIONAL BIOPSY Left    benign  . COLONOSCOPY    . DG 5TH DIGIT LEFT FOOT Left   . DILATATION & CURETTAGE/HYSTEROSCOPY WITH MYOSURE N/A 06/02/2016   Procedure: DILATATION & CURETTAGE/HYSTEROSCOPY WITH MYOSURE;   Surgeon: Thurnell Lose, MD;  Location: [facility];  Service: Gynecology;  Laterality: N/A;  Myosure - Endometrial Mass  . EYE SURGERY Left    tear duct  . JOINT REPLACEMENT     Right total hip Dr. Felicie Morn 04-28-18  . LEG SURGERY Left    have rods and srcews  . TOTAL HIP ARTHROPLASTY Right 04/28/2018   Procedure: RIGHT TOTAL HIP ARTHROPLASTY ANTERIOR APPROACH;  Surgeon: Mcarthur Rossetti, MD;  Location: [facility];  Service: Orthopedics;  Laterality: Right;    Current Medications: Current Meds  Medication Sig  . amLODipine (NORVASC) 5 MG tablet Take 1 tablet (5 mg total) by mouth daily.  . Ascorbic Acid (VITAMIN C) 100 MG tablet Take 100 mg by mouth daily.  [name] atorvastatin (LIPITOR) 10 MG tablet TAKE 1 TABLET BY MOUTH EVERY DAY AT 6PM  . cetirizine (ZYRTEC) 10 MG tablet Take 10 mg by mouth daily as needed for allergies.  . cholecalciferol (VITAMIN D3) 25 MCG (1000 UT) tablet Take 1,000 Units by mouth daily.  . hydrochlorothiazide (HYDRODIURIL) 25 MG tablet Take 1 tablet (25 mg total) by mouth daily.  . Multiple Vitamins-Minerals (MULTIVITAMIN PO) Take 1 tablet by mouth daily.     Allergies:    Amlodipine besy-benazepril hcl, Cleocin [clindamycin hcl], Hyoscyamine, Penicillins, Tramadol, and Zylet [loteprednol-tobramycin]   Social History: Social History   Socioeconomic History  . Marital status: Married    Spouse name: Not on file  .  Number of children: 2  . Years of education: Not on file  . Highest education level: Not on file  Occupational History  . Occupation: Photographer: [employer]  . Financial resource strain: Not on file  . Food insecurity    Worry: Not on file    Inability: Not on file  . Transportation needs    Medical: Not on file    Non-medical: Not on file  Tobacco Use  . Smoking status: Never Smoker  . Smokeless tobacco: Never Used  Substance and Sexual Activity  . Alcohol use: No  . Drug use: No  . Sexual  activity: Yes    Birth control/protection: Post-menopausal  Lifestyle  . Physical activity    Days per week: Not on file    Minutes per session: Not on file  . Stress: Not on file  Relationships  . Social Herbalist on phone: Not on file    Gets together: Not on file    Attends religious service: Not on file    Active member of club or organization: Not on file    Attends meetings of clubs or organizations: Not on file    Relationship status: Not on file  Other Topics Concern  . Not on file  Social History Narrative  . Not on file     Family History: The patient's family history includes Diabetes in her maternal grandmother; Hypertension in her mother.  ROS:   All other ROS reviewed and negative. Pertinent positives noted in the HPI.     EKGs/Labs/Other Studies Reviewed:   The following studies were personally reviewed by me today:  EKG:  EKG is ordered today.  The ekg ordered today demonstrates normal sinus rhythm, heart rate 75, left atrial abnormality noted, nonspecific ST-T changes, no definitive LVH, and was personally reviewed by me.   Recent Labs: 01/19/2019: ALT 18; Hemoglobin 12.9; Platelets 234.0; TSH 1.13 04/30/2019: Pro B Natriuretic peptide (BNP) 29.0 05/15/2019: BUN 10; Creatinine, Ser 0.79; Potassium 3.5; Sodium 138   Recent Lipid Panel    Component Value Date/Time   CHOL 149 01/19/2019 1035   TRIG 137.0 01/19/2019 1035   HDL 44.70 01/19/2019 1035   CHOLHDL 3 01/19/2019 1035   VLDL 27.4 01/19/2019 1035   LDLCALC 77 01/19/2019 1035   LDLDIRECT 144.4 10/16/2012 0942    Physical Exam:   VS:  BP 128/76 (BP Location: Left Arm, Patient Position: Sitting, Cuff Size: Normal)   Pulse 75   Temp (!) 97.2 F (36.2 C)   Ht 5\' 2"  (1.575 m)   Wt 192 lb (87.1 kg)   LMP 07/31/2013   BMI 35.12 kg/m    Wt Readings from Last 3 Encounters:  05/23/19 192 lb (87.1 kg)  05/15/19 190 lb (86.2 kg)  04/30/19 192 lb (87.1 kg)    General: Well nourished, well  developed, in no acute distress Heart: Atraumatic, normal size  Eyes: PEERLA, EOMI  Neck: Supple, no JVD Endocrine: No thryomegaly Cardiac: Normal S1, S2; RRR; no murmurs, rubs, or gallops Lungs: Clear to auscultation bilaterally, no wheezing, rhonchi or rales  Abd: Soft, nontender, no hepatomegaly  Ext: No edema, pulses 2+ Musculoskeletal: No deformities, BUE and BLE strength normal and equal Skin: Warm and dry, no rashes   Neuro: Alert and oriented to person, place, time, and situation, CNII-XII grossly intact, no focal deficits  Psych: Normal mood and affect   ASSESSMENT:   Nile Riggs  is a 58 y.o. female who presents for the following: 1. Cardiomegaly   2. Essential hypertension     PLAN:   1. Cardiomegaly -Observed on chest x-ray.  EKG within normal limits today.  Unclear what her lower extremity edema are related to but appears to be better with HCTZ.  I have a low suspicion for heart failure or cardiovascular disease in her.  I think it is reasonable to reassure her that everything is okay to pursue an echocardiogram.  We will follow-up by phone regarding the results of this.  She has no murmurs to suggest underlying valvular heart disease.  However, should we see something that needs follow-up we will get in touch with her about this.  I will also follow-up with her by phone the results.  2. Essential hypertension -Well-controlled today on Norvasc and HCTZ.  I do wonder if the lower extremity edema she had was related to her amlodipine.  However this is much better on HCTZ.   Disposition: Return if symptoms worsen or fail to improve.  Medication Adjustments/Labs and Tests Ordered: Current medicines are reviewed at length with the patient today.  Concerns regarding medicines are outlined above.  Orders Placed This Encounter  Procedures  . EKG 12-Lead  . ECHOCARDIOGRAM COMPLETE   No orders of the defined types were placed in this encounter.   Patient Instructions   Medication Instructions:  NO CHANGES  Lab Work: NOT NEEDED  Testing/Procedures: WILL BE SCHEDULE AT [facility]  Your physician has requested that you have an echocardiogram. Echocardiography is a painless test that uses sound waves to create images of your heart. It provides your doctor with information about the size and shape of your heart and how well your heart's chambers and valves are working. This procedure takes approximately one hour. There are no restrictions for this procedure.    Follow-Up: At [facility], you and your health needs are our priority.  As part of our continuing mission to provide you with exceptional heart care, we have created designated Provider Care Teams.  These Care Teams include your primary Cardiologist (physician) and Advanced Practice Providers (APPs -  Physician Assistants and Nurse Practitioners) who all work together to provide you with the care you need, when you need it.  Your next appointment:   AS NEEDED  The format for your next appointment:   AS NEEDED  Provider:   Eleonore Chiquito, MD  Other Instructions     Signed, Addison Naegeli. Audie Box, [place]  [address], [place], [place] [address] [phone]  05/23/2019 12:06 PM

## 2019-05-23 ENCOUNTER — Other Ambulatory Visit: Payer: Self-pay

## 2019-05-23 ENCOUNTER — Ambulatory Visit: Payer: BC Managed Care – PPO | Admitting: Cardiovascular Disease

## 2019-05-23 ENCOUNTER — Encounter: Payer: Self-pay | Admitting: Cardiovascular Disease

## 2019-05-23 ENCOUNTER — Ambulatory Visit (HOSPITAL_COMMUNITY): Payer: BC Managed Care – PPO | Attending: Cardiovascular Disease

## 2019-05-23 VITALS — BP 128/76 | HR 75 | Temp 97.2°F | Ht 62.0 in | Wt 192.0 lb

## 2019-05-23 DIAGNOSIS — I517 Cardiomegaly: Secondary | ICD-10-CM | POA: Diagnosis present

## 2019-05-23 DIAGNOSIS — I10 Essential (primary) hypertension: Secondary | ICD-10-CM | POA: Diagnosis not present

## 2019-05-23 LAB — ECHOCARDIOGRAM COMPLETE
Height: 62 in
Weight: 3072 oz

## 2019-05-23 NOTE — Patient Instructions (Signed)
Medication Instructions:  NO CHANGES  Lab Work: NOT NEEDED  Testing/Procedures: WILL BE SCHEDULE AT [facility]  Your physician has requested that you have an echocardiogram. Echocardiography is a painless test that uses sound waves to create images of your heart. It provides your doctor with information about the size and shape of your heart and how well your heart's chambers and valves are working. This procedure takes approximately one hour. There are no restrictions for this procedure.    Follow-Up: At [facility], you and your health needs are our priority.  As part of our continuing mission to provide you with exceptional heart care, we have created designated Provider Care Teams.  These Care Teams include your primary Cardiologist (physician) and Advanced Practice Providers (APPs -  Physician Assistants and Nurse Practitioners) who all work together to provide you with the care you need, when you need it.  Your next appointment:   AS NEEDED  The format for your next appointment:   AS NEEDED  Provider:   Eleonore Chiquito, MD  Other Instructions

## 2019-05-24 ENCOUNTER — Telehealth: Payer: Self-pay | Admitting: Cardiovascular Disease

## 2019-05-24 NOTE — Telephone Encounter (Signed)
Attempted to call Mrs. Sondergaard about recent normal echo. She will just need to follow with [place] as needed. No answer, left message.   [place] T. Audie Box, [place]  [address] [place], [place] [address] [phone]  8:27 AM

## 2019-08-14 ENCOUNTER — Telehealth: Payer: Self-pay | Admitting: Family

## 2019-08-14 NOTE — Telephone Encounter (Signed)
     1. Which medications need to be refilled? (please list name of each medication and dose if known) hydrochlorothiazide (HYDRODIURIL) 25 MG tablet  2. Which pharmacy/location (including street and city if local pharmacy) is medication to be sent to?[id number] - [place] [place] - [address] AT [address]  3. Do they need a 30 day or 90 day supply? [place]

## 2019-08-15 ENCOUNTER — Other Ambulatory Visit: Payer: Self-pay | Admitting: Family

## 2019-08-15 MED ORDER — HYDROCHLOROTHIAZIDE 25 MG PO TABS: 25.0000 mg | ORAL_TABLET | Freq: Every day | ORAL | 3 refills | Status: DC

## 2019-08-27 ENCOUNTER — Ambulatory Visit: Payer: BC Managed Care – PPO | Admitting: Family

## 2019-10-08 ENCOUNTER — Other Ambulatory Visit: Payer: Self-pay

## 2019-10-08 ENCOUNTER — Ambulatory Visit: Payer: BC Managed Care – PPO | Admitting: Family

## 2019-10-08 ENCOUNTER — Encounter: Payer: Self-pay | Admitting: Family

## 2019-10-08 ENCOUNTER — Telehealth: Payer: Self-pay | Admitting: Family

## 2019-10-08 VITALS — BP 126/80 | HR 61 | Temp 98.2°F | Ht 62.0 in | Wt 186.8 lb

## 2019-10-08 DIAGNOSIS — I10 Essential (primary) hypertension: Secondary | ICD-10-CM | POA: Diagnosis not present

## 2019-10-08 DIAGNOSIS — J309 Allergic rhinitis, unspecified: Secondary | ICD-10-CM | POA: Diagnosis not present

## 2019-10-08 MED ORDER — CETIRIZINE HCL 10 MG PO TABS: 10.0000 mg | ORAL_TABLET | Freq: Every day | ORAL | 3 refills | Status: DC

## 2019-10-08 MED ORDER — FLUTICASONE PROPIONATE 50 MCG/ACT NA SUSP: 2.0000 | Freq: Every day | NASAL | 6 refills | Status: DC

## 2019-10-08 NOTE — Telephone Encounter (Signed)
Sent in today 

## 2019-10-08 NOTE — Progress Notes (Signed)
Sylvia Davies is a 59 y.o. female with the following history as recorded in EpicCare:  Patient Active Problem List   Diagnosis Date Noted  . Acute upper respiratory infection 09/12/2018  . Status post total replacement of right hip 04/28/2018  . Unilateral primary osteoarthritis, right hip 03/20/2018  . Arthritis of right ankle 05/17/2017  . Stress fracture of right tibia 02/08/2017  . Right knee pain 01/18/2017  . Medication reaction 08/01/2014  . Rash and nonspecific skin eruption 01/06/2014  . Left knee pain 04/11/2013  . Hemorrhoids 12/20/2012  . Contact dermatitis 11/08/2012  . Impaired glucose tolerance 11/08/2012  . Preventative health care 06/29/2011  . LEG PAIN, BILATERAL 03/20/2009  . HYPERTENSION, BENIGN ESSENTIAL 05/04/2007  . Hyperlipidemia 04/02/2007  . OBESITY 04/02/2007  . MIGRAINE HEADACHE 04/02/2007  . MITRAL REGURGITATION, 0 (MILD) 04/02/2007  . Allergic rhinitis 04/02/2007  . GERD 04/02/2007  . Irritable bowel syndrome 04/02/2007  . LOW BACK PAIN 04/02/2007  . FIBROCYSTIC BREAST DISEASE, HX OF 04/02/2007  . BREAST BIOPSY, HX OF 04/02/2007    Current Outpatient Medications  Medication Sig Dispense Refill  . amLODipine (NORVASC) 5 MG tablet Take 1 tablet (5 mg total) by mouth daily. 90 tablet 3  . Ascorbic Acid (VITAMIN C) 100 MG tablet Take 100 mg by mouth daily.    [name] atorvastatin (LIPITOR) 10 MG tablet TAKE 1 TABLET BY MOUTH EVERY DAY AT 6PM 90 tablet 3  . cetirizine (ZYRTEC) 10 MG tablet Take 1 tablet (10 mg total) by mouth at bedtime. 30 tablet 3  . cholecalciferol (VITAMIN D3) 25 MCG (1000 UT) tablet Take 1,000 Units by mouth daily.    . hydrochlorothiazide (HYDRODIURIL) 25 MG tablet Take 1 tablet (25 mg total) by mouth daily. 90 tablet 3  . Multiple Vitamins-Minerals (MULTIVITAMIN PO) Take 1 tablet by mouth daily.    . fluticasone (FLONASE) 50 MCG/ACT nasal spray Place 2 sprays into both nostrils daily. 16 g 6   No current facility-administered  medications for this visit.    Allergies: Amlodipine besy-benazepril hcl, Cleocin [clindamycin hcl], Hyoscyamine, Penicillins, Tramadol, and Zylet [loteprednol-tobramycin]  Past Medical History:  Diagnosis Date  . ALLERGIC RHINITIS 04/02/2007  . Arthritis   . BREAST BIOPSY, HX OF 04/02/2007  . Diverticulosis   . FIBROCYSTIC BREAST DISEASE, HX OF 04/02/2007  . Hemorrhoids   . HYPERLIPIDEMIA 04/02/2007  . HYPERTENSION, BENIGN ESSENTIAL 05/04/2007  . Impaired glucose tolerance 11/08/2012  . Irritable bowel syndrome 04/02/2007  . MITRAL REGURGITATION, 0 (MILD) 04/02/2007  . OBESITY 04/02/2007    Past Surgical History:  Procedure Laterality Date  . BREAST BIOPSY Left   . BREAST EXCISIONAL BIOPSY Left    benign  . COLONOSCOPY    . DG 5TH DIGIT LEFT FOOT Left   . DILATATION & CURETTAGE/HYSTEROSCOPY WITH MYOSURE N/A 06/02/2016   Procedure: DILATATION & CURETTAGE/HYSTEROSCOPY WITH MYOSURE;  Surgeon: Thurnell Lose, MD;  Location: [facility];  Service: Gynecology;  Laterality: N/A;  Myosure - Endometrial Mass  . EYE SURGERY Left    tear duct  . JOINT REPLACEMENT     Right total hip Dr. Felicie Morn 04-28-18  . LEG SURGERY Left    have rods and srcews  . TOTAL HIP ARTHROPLASTY Right 04/28/2018   Procedure: RIGHT TOTAL HIP ARTHROPLASTY ANTERIOR APPROACH;  Surgeon: Mcarthur Rossetti, MD;  Location: [facility];  Service: Orthopedics;  Laterality: Right;    Family History  Problem Relation Age of Onset  . Hypertension Mother   . Diabetes Maternal Grandmother  Social History   Tobacco Use  . Smoking status: Never Smoker  . Smokeless tobacco: Never Used  Substance Use Topics  . Alcohol use: No    Subjective:  3 month follow-up on hypertension; has been doing very well with addition of HCTZ 25 mg daily; has been able to move more/ exercise more easily and has lost 6 pounds since last OV in November 2020; Having increased problems with allergies; using OTC medication with limited benefit- not  sure what she is taking; having increased drainage especially when she wakes up;  Objective:  Vitals:   10/08/19 1013  BP: 126/80  Pulse: 61  Temp: 98.2 F (36.8 C)  TempSrc: Oral  SpO2: 98%  Weight: 186 lb 12.8 oz (84.7 kg)  Height: 5\' 2"  (1.575 m)    General: Well developed, well nourished, in no acute distress  Skin : Warm and dry.  Head: Normocephalic and atraumatic  Eyes: Sclera and conjunctiva clear; pupils round and reactive to light; extraocular movements intact  Ears: External normal; canals clear; tympanic membranes normal  Oropharynx: Pink, supple. No suspicious lesions  Neck: Supple without thyromegaly, adenopathy  Lungs: Respirations unlabored; clear to auscultation bilaterally without wheeze, rales, rhonchi  CVS exam: normal rate and regular rhythm.  Extremities: No edema, cyanosis, clubbing  Vessels: Symmetric bilaterally  Neurologic: Alert and oriented; speech intact; face symmetrical; moves all extremities well; CNII-XII intact without focal deficit   Assessment:  1. Essential hypertension   2. Allergic rhinitis, unspecified seasonality, unspecified trigger     Plan:  1. Stable; good response to current regimen; 2. Trial of Zyrtec and Flonase; ( patient has taken oral prednisone with no difficulty);  This visit occurred during the SARS-CoV-2 public health emergency.  Safety protocols were in place, including screening questions prior to the visit, additional usage of staff PPE, and extensive cleaning of exam room while observing appropriate contact time as indicated for disinfecting solutions.     Return for CPE after 01/23/2020.  No orders of the defined types were placed in this encounter.   Requested Prescriptions   Signed Prescriptions Disp Refills  . cetirizine (ZYRTEC) 10 MG tablet 30 tablet 3    Sig: Take 1 tablet (10 mg total) by mouth at bedtime.  . fluticasone (FLONASE) 50 MCG/ACT nasal spray 16 g 6    Sig: Place 2 sprays into both nostrils  daily.

## 2019-10-08 NOTE — Telephone Encounter (Signed)
    Pt c/o medication issue:  1. Name of Medication: cetirizine (ZYRTEC) 10 MG tablet  2. How are you currently taking this medication (dosage and times per day)? n/a  3. Are you having a reaction (difficulty breathing--STAT)? no  4. What is your medication issue? Per patient insurance will not cover, requesting alternative
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
## 2019-11-05 ENCOUNTER — Other Ambulatory Visit: Payer: Self-pay | Admitting: Nurse Practitioner

## 2019-11-05 DIAGNOSIS — Z1231 Encounter for screening mammogram for malignant neoplasm of breast: Secondary | ICD-10-CM

## 2019-11-12 ENCOUNTER — Ambulatory Visit (INDEPENDENT_AMBULATORY_CARE_PROVIDER_SITE_OTHER): Payer: BC Managed Care – PPO

## 2019-11-12 ENCOUNTER — Other Ambulatory Visit: Payer: Self-pay

## 2019-11-12 ENCOUNTER — Other Ambulatory Visit: Payer: Self-pay | Admitting: Family

## 2019-11-12 ENCOUNTER — Encounter: Payer: Self-pay | Admitting: Family

## 2019-11-12 ENCOUNTER — Ambulatory Visit: Payer: BC Managed Care – PPO | Admitting: Family

## 2019-11-12 VITALS — BP 124/76 | HR 75 | Temp 98.2°F | Ht 62.0 in | Wt 186.6 lb

## 2019-11-12 DIAGNOSIS — M25561 Pain in right knee: Secondary | ICD-10-CM | POA: Diagnosis not present

## 2019-11-12 MED ORDER — MELOXICAM 15 MG PO TABS: 15.0000 mg | ORAL_TABLET | Freq: Every day | ORAL | 0 refills | Status: DC
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
## 2019-11-21 ENCOUNTER — Encounter: Payer: Self-pay | Admitting: Family Medicine

## 2019-11-21 ENCOUNTER — Ambulatory Visit: Payer: Self-pay

## 2019-11-21 ENCOUNTER — Other Ambulatory Visit: Payer: Self-pay

## 2019-11-21 ENCOUNTER — Ambulatory Visit: Payer: BC Managed Care – PPO | Admitting: Family Medicine

## 2019-11-21 VITALS — BP 140/82 | HR 76 | Ht 62.0 in | Wt 188.4 lb

## 2019-11-21 DIAGNOSIS — M25561 Pain in right knee: Secondary | ICD-10-CM | POA: Diagnosis not present

## 2019-11-21 NOTE — Patient Instructions (Addendum)
Thank you for coming in today. Call or go to the ER if you develop a large red swollen joint with extreme pain or oozing puss.  Use the voltaren gel up to 4x daily.  Consider a compression knee sleeve.  Recheck with me as needed.   I recommend you obtained a compression sleeve to help with your joint problems. There are many options on the market however I recommend obtaining a knee Body Helix compression sleeve.  You can find information (including how to appropriate measure yourself for sizing) can be found at www.Body http://www.lambert.com/.  Many of these products are health savings account (HSA) eligible.   You can use the compression sleeve at any time throughout the day but is most important to use while being active as well as for 2 hours post-activity.   It is appropriate to ice following activity with the compression sleeve in place.

## 2019-11-21 NOTE — Progress Notes (Signed)
Subjective:    CC: R knee pain  I, Molly Weber, LAT, ATC, am serving as scribe for Dr. Lynne Leader.  HPI: Pt is a 59 y/o female presenting w/ c/o R knee pain x approximately one week after possibly injuring herself while stepping up into a truck.  She locates her pain to her R anterior knee.  She rates her pain as mild and describes her pain as throbbing.  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  Her job does require her to go down steps and occasionally accompanied children to their home.  Radiating pain: No R knee swelling: Yes R knee mechanical symptoms: No Aggravating factors: walking; squatting, stairs Treatments tried: Meloxicam  Diagnostic imaging: R knee XR- 11/12/19  Pertinent review of Systems: No fevers or chills  Relevant historical information: History right total hip replacement   Objective:    Vitals:   11/21/19 1100  BP: 140/82  Pulse: 76  SpO2: 97%   General: Well Developed, well nourished, and in no acute distress.   MSK: Right knee mild effusion otherwise normal-appearing. Range of motion 0-120 degrees without significant crepitation. Not particularly tender to palpation. Stable ligamentous exam. Negative Murray's test. Intact strength.  Lab and Radiology Results DG Knee AP/LAT W/Sunrise Right  Result Date: 11/12/2019 CLINICAL DATA:  Knee pain for 1 week. No known injury. EXAM: RIGHT KNEE 3 VIEWS COMPARISON:  Radiographs 01/18/2017. FINDINGS: The bones appear adequately mineralized. There is progressive joint space narrowing and osteophyte formation in the medial compartment. There is also increased joint space narrowing laterally in the patellofemoral compartment. There is a new moderate-sized knee joint effusion. No evidence of acute fracture or dislocation. 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 No acute osseous findings. Electronically Signed   By: Richardean Sale M.D.   On: 11/12/2019  16:58   I, Lynne Leader, personally (independently) visualized and performed the interpretation of the images attached in this note.  Diagnostic Limited MSK Ultrasound of: Right knee Quad tendon intact normal-appearing Small joint effusion present superior patellar space. Patellar tendon normal-appearing intact. Lateral joint line minimally degenerative largely normal-appearing Medial joint line moderately degenerative with partially extruded or partially absent medial meniscus. No Baker's cyst posterior knee. Normal bony structures otherwise. Impression: Medial joint line DJD with probable medial meniscus tear  Procedure: Real-time Ultrasound Guided Injection of right knee superior patellar space Device: Philips Affiniti 50G Images permanently stored and available for review in the ultrasound unit. Verbal informed consent obtained.  Discussed risks and benefits of procedure. Warned about infection bleeding damage to structures skin hypopigmentation and fat atrophy among others. Patient expresses understanding and agreement Time-out conducted.   Noted no overlying erythema, induration, or other signs of local infection.   Skin prepped in a sterile fashion.   Local anesthesia: Topical Ethyl chloride.   With sterile technique and under real time ultrasound guidance:  40 mg of Kenalog and 3 mL of Marcaine injected easily.   Completed without difficulty   Pain immediately resolved suggesting accurate placement of the medication.   Advised to call if fevers/chills, erythema, induration, drainage, or persistent bleeding.   Images permanently stored and available for review in the ultrasound unit.  Impression: Technically successful ultrasound guided injection.     Impression and Recommendations:    Assessment and Plan: 59 y.o. female with right knee pain.  Exacerbation occurred with knee flexed with force extending the knee going up stairs.  Concerning for exacerbation of DJD or  degenerative meniscus tear.  Regardless does not have significant mechanical symptoms today.  Discussed options.  Plan for Voltaren gel compressive knee sleeve and intra-articular injection as above.  Recheck back as needed if not improving.  Precautions reviewed.  Work note provided if needed..   Orders Placed This Encounter  Procedures  . [place] LIMITED JOINT SPACE STRUCTURES LOW RIGHT(NO LINKED CHARGES)    Order Specific Question:   Reason for Exam (SYMPTOM  OR DIAGNOSIS REQUIRED)    Answer:   R knee pain    Order Specific Question:   Preferred imaging location?    Answer:   [place]   No orders of the defined types were placed in this encounter.   Discussed warning signs or symptoms. Please see discharge instructions. Patient expresses understanding.   The above documentation has been reviewed and is accurate and complete Lynne Leader

## 2019-12-21 ENCOUNTER — Other Ambulatory Visit: Payer: Self-pay

## 2019-12-21 ENCOUNTER — Ambulatory Visit
Admission: RE | Admit: 2019-12-21 | Discharge: 2019-12-21 | Disposition: A | Discharge status: Home / Self Care | Payer: BC Managed Care – PPO | Source: Ambulatory Visit | Attending: Nurse Practitioner | Admitting: Nurse Practitioner

## 2019-12-21 DIAGNOSIS — Z1231 Encounter for screening mammogram for malignant neoplasm of breast: Secondary | ICD-10-CM

## 2020-01-11 ENCOUNTER — Other Ambulatory Visit: Payer: Self-pay

## 2020-01-11 ENCOUNTER — Ambulatory Visit: Payer: BC Managed Care – PPO | Admitting: Podiatry

## 2020-01-11 DIAGNOSIS — Q666 Other congenital valgus deformities of feet: Secondary | ICD-10-CM

## 2020-01-15 ENCOUNTER — Encounter: Payer: Self-pay | Admitting: Podiatry

## 2020-01-15 NOTE — Progress Notes (Addendum)
Subjective:  Patient ID: Sylvia Davies, female    DOB: 1960-08-07,  MRN: [id number]  Chief Complaint  Patient presents with  . Callouses    pt is here for possible callus.    59 y.o. female presents with the above complaint.  Patient presents with complaint of bilateral hallux IPJ hyperkeratotic lesion secondary to underlying pes planus deformity.  Patient states is painful to walk on.  Patient states that she is constantly on her foot and has been going on for about 2 months has progressive gotten worse.  Patient states is primarily both big toes on the medial side.  Right side is slightly worse than left side.  Pain scale is 7 out of 10.  Is dull achy in nature.  Patient has not seen anyone else prior to see me.  She has not tried any other alleviation factor.   Review of Systems: Negative except as noted in the HPI. Denies N/V/F/Ch.  Past Medical History:  Diagnosis Date  . ALLERGIC RHINITIS 04/02/2007  . Arthritis   . BREAST BIOPSY, HX OF 04/02/2007  . Diverticulosis   . FIBROCYSTIC BREAST DISEASE, HX OF 04/02/2007  . Hemorrhoids   . HYPERLIPIDEMIA 04/02/2007  . HYPERTENSION, BENIGN ESSENTIAL 05/04/2007  . Impaired glucose tolerance 11/08/2012  . Irritable bowel syndrome 04/02/2007  . MITRAL REGURGITATION, 0 (MILD) 04/02/2007  . OBESITY 04/02/2007    Current Outpatient Medications:  .  amLODipine (NORVASC) 5 MG tablet, Take 1 tablet (5 mg total) by mouth daily., Disp: 90 tablet, Rfl: 3 .  Ascorbic Acid (VITAMIN C) 100 MG tablet, Take 100 mg by mouth daily., Disp: , Rfl:  .  atorvastatin (LIPITOR) 10 MG tablet, TAKE 1 TABLET BY MOUTH EVERY DAY AT 6PM, Disp: 90 tablet, Rfl: 3 .  cetirizine (ZYRTEC) 10 MG tablet, Take 1 tablet (10 mg total) by mouth at bedtime., Disp: 30 tablet, Rfl: 3 .  cholecalciferol (VITAMIN D3) 25 MCG (1000 UT) tablet, Take 1,000 Units by mouth daily., Disp: , Rfl:  .  fluticasone (FLONASE) 50 MCG/ACT nasal spray, Place 2 sprays into both nostrils daily.,  Disp: 16 g, Rfl: 6 .  hydrochlorothiazide (HYDRODIURIL) 25 MG tablet, Take 1 tablet (25 mg total) by mouth daily., Disp: 90 tablet, Rfl: 3 .  meloxicam (MOBIC) 15 MG tablet, Take 1 tablet (15 mg total) by mouth daily., Disp: 30 tablet, Rfl: 0 .  Multiple Vitamins-Minerals (MULTIVITAMIN PO), Take 1 tablet by mouth daily., Disp: , Rfl:   Social History   Tobacco Use  Smoking Status Never Smoker  Smokeless Tobacco Never Used    Allergies  Allergen Reactions  . Amlodipine Besy-Benazepril Hcl Itching    REACTION:sore throat  . Cleocin [Clindamycin Hcl] Itching  . Hyoscyamine   . Penicillins     Has patient had a PCN reaction causing immediate rash, facial/tongue/throat swelling, SOB or lightheadedness with hypotension: yes Has patient had a PCN reaction causing severe rash involving mucus membranes or skin necrosis: no Has patient had a PCN reaction that required hospitalization no Has patient had a PCN reaction occurring within the last 10 years: yes If all of the above answers are "NO", then may proceed with Cephalosporin use.   . Tramadol Hives  . Zylet [Loteprednol-Tobramycin] Itching   Objective:  There were no vitals filed for this visit. There is no height or weight on file to calculate BMI. Constitutional Well developed. Well nourished.  Vascular Dorsalis pedis pulses palpable bilaterally. Posterior tibial pulses palpable bilaterally. Capillary refill normal to  all digits.  No cyanosis or clubbing noted. Pedal hair growth normal.  Neurologic Normal speech. Oriented to person, place, and time. Epicritic sensation to light touch grossly present bilaterally.  Dermatologic Nails well groomed and normal in appearance. No open wounds. No skin lesions.  Orthopedic:  Pain on palpation to the hyperkeratotic lesion to bilateral hallux IPJ.  Gait examination shows calcaneal valgus that is somewhat flexible in nature.  Partially able to recruit the arch.  Patient is able to do  single and double heel raises with calcaneus returning to neutral position.  Mild to many toe signs noted.   Radiographs: None Assessment:   1. Pes planovalgus    Plan:  Patient was evaluated and treated and all questions answered.  Bilateral hallux IPJ calluses secondary to underlying pes planovalgus deformity -I explained to the patient the etiology of IPJ calluses and various treatment options were extensively discussed.  I explained to her that if there is an underlying gait mechanics that is causing her to push off of the hallux IPJ as opposed to the metatarsophalangeal joint and therefore leading to hyperkeratotic lesion formation.  I believe she will benefit from custom-made orthotics to help control the hindfoot motion as well as support the arch of the foot and therefore allowing her to change the gait mechanics of her foot. -She was scheduled with Liliane Channel to see and get custom-made orthotics.  No follow-ups on file.

## 2020-01-23 ENCOUNTER — Encounter: Payer: BC Managed Care – PPO | Admitting: Internal Medicine

## 2020-01-28 ENCOUNTER — Other Ambulatory Visit: Payer: Self-pay

## 2020-01-28 ENCOUNTER — Encounter: Payer: Self-pay | Admitting: Family

## 2020-01-28 ENCOUNTER — Ambulatory Visit (INDEPENDENT_AMBULATORY_CARE_PROVIDER_SITE_OTHER): Payer: BC Managed Care – PPO | Admitting: Family

## 2020-01-28 VITALS — BP 130/72 | HR 74 | Temp 98.2°F | Wt 189.0 lb

## 2020-01-28 DIAGNOSIS — Z1211 Encounter for screening for malignant neoplasm of colon: Secondary | ICD-10-CM

## 2020-01-28 DIAGNOSIS — E782 Mixed hyperlipidemia: Secondary | ICD-10-CM

## 2020-01-28 DIAGNOSIS — E559 Vitamin D deficiency, unspecified: Secondary | ICD-10-CM | POA: Diagnosis not present

## 2020-01-28 DIAGNOSIS — Z0000 Encounter for general adult medical examination without abnormal findings: Secondary | ICD-10-CM | POA: Diagnosis not present

## 2020-01-28 MED ORDER — AMLODIPINE BESYLATE 5 MG PO TABS: 5.0000 mg | ORAL_TABLET | Freq: Every day | ORAL | 3 refills | Status: DC

## 2020-01-28 MED ORDER — MELOXICAM 15 MG PO TABS: 15.0000 mg | ORAL_TABLET | Freq: Every day | ORAL | 2 refills | Status: DC | PRN

## 2020-01-28 MED ORDER — ATORVASTATIN CALCIUM 10 MG PO TABS: ORAL_TABLET | ORAL | 3 refills | Status: DC

## 2020-01-28 MED ORDER — HYDROCHLOROTHIAZIDE 25 MG PO TABS: 25.0000 mg | ORAL_TABLET | Freq: Every day | ORAL | 3 refills | Status: DC

## 2020-01-28 NOTE — Addendum Note (Signed)
Addended by: Cresenciano Lick on: 01/28/2020 03:04 PM   Modules accepted: Orders

## 2020-01-28 NOTE — Progress Notes (Signed)
Sylvia Davies is a 59 y.o. female with the following history as recorded in [employer]:  Patient Active Problem List   Diagnosis Date Noted  . Acute upper respiratory infection 09/12/2018  . Status post total replacement of right hip 04/28/2018  . Unilateral primary osteoarthritis, right hip 03/20/2018  . Arthritis of right ankle 05/17/2017  . Stress fracture of right tibia 02/08/2017  . Right knee pain 01/18/2017  . Medication reaction 08/01/2014  . Rash and nonspecific skin eruption 01/06/2014  . Left knee pain 04/11/2013  . Hemorrhoids 12/20/2012  . Contact dermatitis 11/08/2012  . Impaired glucose tolerance 11/08/2012  . Preventative health care 06/29/2011  . LEG PAIN, BILATERAL 03/20/2009  . HYPERTENSION, BENIGN ESSENTIAL 05/04/2007  . Hyperlipidemia 04/02/2007  . OBESITY 04/02/2007  . MIGRAINE HEADACHE 04/02/2007  . MITRAL REGURGITATION, 0 (MILD) 04/02/2007  . Allergic rhinitis 04/02/2007  . GERD 04/02/2007  . Irritable bowel syndrome 04/02/2007  . LOW BACK PAIN 04/02/2007  . FIBROCYSTIC BREAST DISEASE, HX OF 04/02/2007  . BREAST BIOPSY, HX OF 04/02/2007    Current Outpatient Medications  Medication Sig Dispense Refill  . amLODipine (NORVASC) 5 MG tablet Take 1 tablet (5 mg total) by mouth daily. 90 tablet 3  . Ascorbic Acid (VITAMIN C) 100 MG tablet Take 100 mg by mouth daily.    [name] atorvastatin (LIPITOR) 10 MG tablet TAKE 1 TABLET BY MOUTH EVERY DAY AT 6PM 90 tablet 3  . cetirizine (ZYRTEC) 10 MG tablet Take 1 tablet (10 mg total) by mouth at bedtime. 30 tablet 3  . cholecalciferol (VITAMIN D3) 25 MCG (1000 UT) tablet Take 1,000 Units by mouth daily.    . fluticasone (FLONASE) 50 MCG/ACT nasal spray Place 2 sprays into both nostrils daily. 16 g 6  . hydrochlorothiazide (HYDRODIURIL) 25 MG tablet Take 1 tablet (25 mg total) by mouth daily. 90 tablet 3  . meloxicam (MOBIC) 15 MG tablet Take 1 tablet (15 mg total) by mouth daily as needed for pain. 30 tablet 2  .  Multiple Vitamins-Minerals (MULTIVITAMIN PO) Take 1 tablet by mouth daily.     No current facility-administered medications for this visit.    Allergies: Amlodipine besy-benazepril hcl, Cleocin [clindamycin hcl], Hyoscyamine, Penicillins, Tramadol, and Zylet [loteprednol-tobramycin]  Past Medical History:  Diagnosis Date  . ALLERGIC RHINITIS 04/02/2007  . Arthritis   . BREAST BIOPSY, HX OF 04/02/2007  . Diverticulosis   . FIBROCYSTIC BREAST DISEASE, HX OF 04/02/2007  . Hemorrhoids   . HYPERLIPIDEMIA 04/02/2007  . HYPERTENSION, BENIGN ESSENTIAL 05/04/2007  . Impaired glucose tolerance 11/08/2012  . Irritable bowel syndrome 04/02/2007  . MITRAL REGURGITATION, 0 (MILD) 04/02/2007  . OBESITY 04/02/2007    Past Surgical History:  Procedure Laterality Date  . BREAST BIOPSY Left   . BREAST EXCISIONAL BIOPSY Left    benign  . COLONOSCOPY    . DG 5TH DIGIT LEFT FOOT Left   . DILATATION & CURETTAGE/HYSTEROSCOPY WITH MYOSURE N/A 06/02/2016   Procedure: DILATATION & CURETTAGE/HYSTEROSCOPY WITH MYOSURE;  Surgeon: Thurnell Lose, MD;  Location: [facility];  Service: Gynecology;  Laterality: N/A;  Myosure - Endometrial Mass  . EYE SURGERY Left    tear duct  . JOINT REPLACEMENT     Right total hip Dr. Felicie Morn 04-28-18  . LEG SURGERY Left    have rods and srcews  . TOTAL HIP ARTHROPLASTY Right 04/28/2018   Procedure: RIGHT TOTAL HIP ARTHROPLASTY ANTERIOR APPROACH;  Surgeon: Mcarthur Rossetti, MD;  Location: [facility];  Service: Orthopedics;  Laterality:  Right;    Family History  Problem Relation Age of Onset  . Hypertension Mother   . Diabetes Maternal Grandmother     Social History   Tobacco Use  . Smoking status: Never Smoker  . Smokeless tobacco: Never Used  Substance Use Topics  . Alcohol use: No    Subjective:  Patient presents today for yearly CPE; is up to date on health maintenance; does have GYN; due for colonoscopy; up to date with dentist and eye doctor;   Review of Systems   Constitutional: Negative.   HENT: Negative.   Eyes: Negative.   Respiratory: Negative.   Cardiovascular: Negative.   Gastrointestinal: Negative.   Genitourinary: Negative.   Musculoskeletal: Negative.   Skin: Negative.   Neurological: Negative.   Endo/Heme/Allergies: Negative.   Psychiatric/Behavioral: Negative.      Objective:  Vitals:   01/28/20 1423  BP: 130/72  Pulse: 74  Temp: 98.2 F (36.8 C)  TempSrc: Oral  SpO2: 98%  Weight: 189 lb (85.7 kg)    General: Well developed, well nourished, in no acute distress  Skin : Warm and dry.  Head: Normocephalic and atraumatic  Eyes: Sclera and conjunctiva clear; pupils round and reactive to light; extraocular movements intact  Ears: External normal; canals clear; tympanic membranes normal  Oropharynx: Pink, supple. No suspicious lesions  Neck: Supple without thyromegaly, adenopathy  Lungs: Respirations unlabored; clear to auscultation bilaterally without wheeze, rales, rhonchi  CVS exam: normal rate and regular rhythm.  Abdomen: Soft; nontender; nondistended; normoactive bowel sounds; no masses or hepatosplenomegaly  Musculoskeletal: No deformities; no active joint inflammation  Extremities: No edema, cyanosis, clubbing  Vessels: Symmetric bilaterally  Neurologic: Alert and oriented; speech intact; face symmetrical; moves all extremities well; CNII-XII intact without focal deficit   Assessment:  1. PE (physical exam), annual   2. Encounter for screening colonoscopy   3. Mixed hyperlipidemia   4. Vitamin D deficiency     Plan:  Age appropriate preventive healthcare needs addressed; encouraged regular eye doctor and dental exams; encouraged regular exercise and continued weight loss goals; will update labs and refills as needed today; follow-up to be determined;  This visit occurred during the SARS-CoV-2 public health emergency.  Safety protocols were in place, including screening questions prior to the visit, additional  usage of staff PPE, and extensive cleaning of exam room while observing appropriate contact time as indicated for disinfecting solutions.      No follow-ups on file.  Orders Placed This Encounter  Procedures  . CBC with Differential/Platelet    Standing Status:   Future    Standing Expiration Date:   01/27/2021  . Comp Met (CMET)    Standing Status:   Future    Standing Expiration Date:   01/27/2021  . Lipid panel    Standing Status:   Future    Standing Expiration Date:   01/27/2021  . TSH    Standing Status:   Future    Standing Expiration Date:   01/27/2021  . Vitamin D (25 hydroxy)    Standing Status:   Future    Standing Expiration Date:   01/27/2021  . Ambulatory referral to Gastroenterology    Referral Priority:   Routine    Referral Type:   Consultation    Referral Reason:   Specialty Services Required    Number of Visits Requested:   1    Requested Prescriptions   Signed Prescriptions Disp Refills  . amLODipine (NORVASC) 5 MG tablet 90 tablet 3  Sig: Take 1 tablet (5 mg total) by mouth daily.  [name] atorvastatin (LIPITOR) 10 MG tablet 90 tablet 3    Sig: TAKE 1 TABLET BY MOUTH EVERY DAY AT 6PM  . hydrochlorothiazide (HYDRODIURIL) 25 MG tablet 90 tablet 3    Sig: Take 1 tablet (25 mg total) by mouth daily.  . meloxicam (MOBIC) 15 MG tablet 30 tablet 2    Sig: Take 1 tablet (15 mg total) by mouth daily as needed for pain.

## 2020-01-28 NOTE — Patient Instructions (Signed)
Please check with your insurance about coverage for Shingrix; you can get it done here or at the pharmacy; it is a 2 shot series over 2-6 months;

## 2020-01-29 ENCOUNTER — Ambulatory Visit: Payer: BC Managed Care – PPO | Admitting: Orthotics

## 2020-01-29 DIAGNOSIS — Q666 Other congenital valgus deformities of feet: Secondary | ICD-10-CM

## 2020-01-29 DIAGNOSIS — M7662 Achilles tendinitis, left leg: Secondary | ICD-10-CM

## 2020-01-29 DIAGNOSIS — M722 Plantar fascial fibromatosis: Secondary | ICD-10-CM

## 2020-01-29 LAB — CBC WITH DIFFERENTIAL/PLATELET
Absolute Monocytes: 429 cells/uL (ref 200–950)
Basophils Absolute: 11 cells/uL (ref 0–200)
Basophils Relative: 0.2 %
Eosinophils Absolute: 180 cells/uL (ref 15–500)
Eosinophils Relative: 3.4 %
HCT: 40.8 % (ref 35.0–45.0)
Hemoglobin: 13.7 g/dL (ref 11.7–15.5)
Lymphs Abs: 2274 cells/uL (ref 850–3900)
MCH: 28 pg (ref 27.0–33.0)
MCHC: 33.6 g/dL (ref 32.0–36.0)
MCV: 83.3 fL (ref 80.0–100.0)
MPV: 10 fL (ref 7.5–12.5)
Monocytes Relative: 8.1 %
Neutro Abs: 2406 cells/uL (ref 1500–7800)
Neutrophils Relative %: 45.4 %
Platelets: 262 10*3/uL (ref 140–400)
RBC: 4.9 10*6/uL (ref 3.80–5.10)
RDW: 14.6 % (ref 11.0–15.0)
Total Lymphocyte: 42.9 %
WBC: 5.3 10*3/uL (ref 3.8–10.8)

## 2020-01-29 LAB — TSH: TSH: 1.84 mIU/L (ref 0.40–4.50)

## 2020-01-29 LAB — COMPREHENSIVE METABOLIC PANEL
AG Ratio: 1.2 (calc) (ref 1.0–2.5)
ALT: 14 U/L (ref 6–29)
AST: 17 U/L (ref 10–35)
Albumin: 4.2 g/dL (ref 3.6–5.1)
Alkaline phosphatase (APISO): 109 U/L (ref 37–153)
BUN: 10 mg/dL (ref 7–25)
CO2: 34 mmol/L — ABNORMAL HIGH (ref 20–32)
Calcium: 9.8 mg/dL (ref 8.6–10.4)
Chloride: 99 mmol/L (ref 98–110)
Creat: 0.68 mg/dL (ref 0.50–0.99)
Globulin: 3.4 g/dL (calc) (ref 1.9–3.7)
Glucose, Bld: 87 mg/dL (ref 65–99)
Potassium: 3.8 mmol/L (ref 3.5–5.3)
Sodium: 139 mmol/L (ref 135–146)
Total Bilirubin: 0.6 mg/dL (ref 0.2–1.2)
Total Protein: 7.6 g/dL (ref 6.1–8.1)

## 2020-01-29 LAB — LIPID PANEL
Cholesterol: 176 mg/dL (ref <200)
HDL: 55 mg/dL (ref >=50)
LDL Cholesterol (Calc): 103 mg/dL (calc) — ABNORMAL HIGH
Non-HDL Cholesterol (Calc): 121 mg/dL (calc) (ref <130)
Total CHOL/HDL Ratio: 3.2 (calc) (ref <5.0)
Triglycerides: 89 mg/dL (ref <150)

## 2020-01-29 LAB — VITAMIN D 25 HYDROXY (VIT D DEFICIENCY, FRACTURES): Vit D, 25-Hydroxy: 40 ng/mL (ref 30–100)

## 2020-01-29 NOTE — Progress Notes (Signed)
patietn cast today for cmfo to address 1) pes planovalgus, 2) callus/corns ipj hallux.

## 2020-02-12 ENCOUNTER — Encounter: Payer: Self-pay | Admitting: Internal Medicine

## 2020-02-19 ENCOUNTER — Other Ambulatory Visit: Payer: Self-pay

## 2020-02-19 ENCOUNTER — Ambulatory Visit: Payer: BC Managed Care – PPO | Admitting: Orthotics

## 2020-02-19 DIAGNOSIS — Q666 Other congenital valgus deformities of feet: Secondary | ICD-10-CM

## 2020-02-19 DIAGNOSIS — M722 Plantar fascial fibromatosis: Secondary | ICD-10-CM

## 2020-02-19 NOTE — Progress Notes (Signed)
Patient came in today to pick up custom made foot orthotics.  The goals were accomplished and the patient reported no dissatisfaction with said orthotics.  Patient was advised of breakin period and how to report any issues. 

## 2020-03-07 ENCOUNTER — Ambulatory Visit (INDEPENDENT_AMBULATORY_CARE_PROVIDER_SITE_OTHER): Payer: BC Managed Care – PPO | Admitting: Family

## 2020-03-07 ENCOUNTER — Other Ambulatory Visit: Payer: Self-pay

## 2020-03-07 ENCOUNTER — Ambulatory Visit (INDEPENDENT_AMBULATORY_CARE_PROVIDER_SITE_OTHER): Payer: BC Managed Care – PPO

## 2020-03-07 VITALS — BP 128/72 | HR 94 | Temp 98.3°F | Ht 62.0 in | Wt 191.0 lb

## 2020-03-07 DIAGNOSIS — M79605 Pain in left leg: Secondary | ICD-10-CM

## 2020-03-07 NOTE — Progress Notes (Signed)
Sylvia Davies is a 59 y.o. female with the following history as recorded in Epic[employer]:  Patient Active Problem List   Diagnosis Date Noted  . Acute upper respiratory infection 09/12/2018  . Status post total replacement of right hip 04/28/2018  . Unilateral primary osteoarthritis, right hip 03/20/2018  . Arthritis of right ankle 05/17/2017  . Stress fracture of right tibia 02/08/2017  . Right knee pain 01/18/2017  . Medication reaction 08/01/2014  . Rash and nonspecific skin eruption 01/06/2014  . Left knee pain 04/11/2013  . Hemorrhoids 12/20/2012  . Contact dermatitis 11/08/2012  . Impaired glucose tolerance 11/08/2012  . Preventative health care 06/29/2011  . LEG PAIN, BILATERAL 03/20/2009  . HYPERTENSION, BENIGN ESSENTIAL 05/04/2007  . Hyperlipidemia 04/02/2007  . OBESITY 04/02/2007  . MIGRAINE HEADACHE 04/02/2007  . MITRAL REGURGITATION, 0 (MILD) 04/02/2007  . Allergic rhinitis 04/02/2007  . GERD 04/02/2007  . Irritable bowel syndrome 04/02/2007  . LOW BACK PAIN 04/02/2007  . FIBROCYSTIC BREAST DISEASE, HX OF 04/02/2007  . BREAST BIOPSY, HX OF 04/02/2007    Current Outpatient Medications  Medication Sig Dispense Refill  . amLODipine (NORVASC) 5 MG tablet Take 1 tablet (5 mg total) by mouth daily. 90 tablet 3  . Ascorbic Acid (VITAMIN C) 100 MG tablet Take 100 mg by mouth daily.    [name] atorvastatin (LIPITOR) 10 MG tablet TAKE 1 TABLET BY MOUTH EVERY DAY AT 6PM 90 tablet 3  . cetirizine (ZYRTEC) 10 MG tablet Take 1 tablet (10 mg total) by mouth at bedtime. 30 tablet 3  . cholecalciferol (VITAMIN D3) 25 MCG (1000 UT) tablet Take 1,000 Units by mouth daily.    . fluticasone (FLONASE) 50 MCG/ACT nasal spray Place 2 sprays into both nostrils daily. 16 g 6  . hydrochlorothiazide (HYDRODIURIL) 25 MG tablet Take 1 tablet (25 mg total) by mouth daily. 90 tablet 3  . meloxicam (MOBIC) 15 MG tablet Take 1 tablet (15 mg total) by mouth daily as needed for pain. 30 tablet 2  .  Multiple Vitamins-Minerals (MULTIVITAMIN PO) Take 1 tablet by mouth daily.     No current facility-administered medications for this visit.    Allergies: Amlodipine besy-benazepril hcl, Cleocin [clindamycin hcl], Hyoscyamine, Penicillins, Tramadol, and Zylet [loteprednol-tobramycin]  Past Medical History:  Diagnosis Date  . ALLERGIC RHINITIS 04/02/2007  . Arthritis   . BREAST BIOPSY, HX OF 04/02/2007  . Diverticulosis   . FIBROCYSTIC BREAST DISEASE, HX OF 04/02/2007  . Hemorrhoids   . HYPERLIPIDEMIA 04/02/2007  . HYPERTENSION, BENIGN ESSENTIAL 05/04/2007  . Impaired glucose tolerance 11/08/2012  . Irritable bowel syndrome 04/02/2007  . MITRAL REGURGITATION, 0 (MILD) 04/02/2007  . OBESITY 04/02/2007    Past Surgical History:  Procedure Laterality Date  . BREAST BIOPSY Left   . BREAST EXCISIONAL BIOPSY Left    benign  . COLONOSCOPY    . DG 5TH DIGIT LEFT FOOT Left   . DILATATION & CURETTAGE/HYSTEROSCOPY WITH MYOSURE N/A 06/02/2016   Procedure: DILATATION & CURETTAGE/HYSTEROSCOPY WITH MYOSURE;  Surgeon: Thurnell Lose, MD;  Location: [facility];  Service: Gynecology;  Laterality: N/A;  Myosure - Endometrial Mass  . EYE SURGERY Left    tear duct  . JOINT REPLACEMENT     Right total hip Dr. Felicie Morn 04-28-18  . LEG SURGERY Left    have rods and srcews  . TOTAL HIP ARTHROPLASTY Right 04/28/2018   Procedure: RIGHT TOTAL HIP ARTHROPLASTY ANTERIOR APPROACH;  Surgeon: Mcarthur Rossetti, MD;  Location: [facility];  Service: Orthopedics;  Laterality:  Right;    Family History  Problem Relation Age of Onset  . Hypertension Mother   . Diabetes Maternal Grandmother     Social History   Tobacco Use  . Smoking status: Never Smoker  . Smokeless tobacco: Never Used  Substance Use Topics  . Alcohol use: No    Subjective:  Complaining of intermittent left knee/ leg pain; has history of broken leg and is concerned that hardware may have moved; wants to work towards goal of running a marathon and  wants to be sure she is safe to train.    Objective:  Vitals:   03/07/20 1036  BP: 128/72  Pulse: 94  Temp: 98.3 F (36.8 C)  SpO2: 97%  Weight: 191 lb (86.6 kg)  Height: 5\' 2"  (1.575 m)    General: Well developed, well nourished, in no acute distress  Skin : Warm and dry.  Head: Normocephalic and atraumatic  Lungs: Respirations unlabored; Musculoskeletal: No deformities; no active joint inflammation  Extremities: No edema, cyanosis, clubbing  Vessels: Symmetric bilaterally  Neurologic: Alert and oriented; speech intact; face symmetrical; moves all extremities well; CNII-XII intact without focal deficit   Assessment:  1. Left leg pain     Plan:  Update x-rays as discussed; continue Mobic 15 mg daily prn; may need to refer back to her orthopedist; follow-up to be determined.  This visit occurred during the SARS-CoV-2 public health emergency.  Safety protocols were in place, including screening questions prior to the visit, additional usage of staff PPE, and extensive cleaning of exam room while observing appropriate contact time as indicated for disinfecting solutions.     No follow-ups on file.  Orders Placed This Encounter  Procedures  . DG Tibia/Fibula Left    Standing Status:   Future    Number of Occurrences:   1    Standing Expiration Date:   03/07/2021    Order Specific Question:   Reason for Exam (SYMPTOM  OR DIAGNOSIS REQUIRED)    Answer:   left leg pain    Order Specific Question:   Is patient pregnant?    Answer:   No    Order Specific Question:   Preferred imaging location?    Answer:   [facility]    Order Specific Question:   Radiology Contrast Protocol - do NOT remove file path    Answer:   [email or web address]  . DG Knee Complete 4 Views Left    Standing Status:   Future    Number of Occurrences:   1    Standing Expiration Date:   03/07/2021    Order Specific Question:   Reason for Exam  (SYMPTOM  OR DIAGNOSIS REQUIRED)    Answer:   left knee pain    Order Specific Question:   Is patient pregnant?    Answer:   No    Order Specific Question:   Preferred imaging location?    Answer:   [facility]    Order Specific Question:   Radiology Contrast Protocol - do NOT remove file path    Answer:   [email or web address]    Requested Prescriptions    No prescriptions requested or ordered in this encounter

## 2020-03-11 ENCOUNTER — Other Ambulatory Visit: Payer: Self-pay | Admitting: Family

## 2020-04-16 ENCOUNTER — Ambulatory Visit: Payer: BC Managed Care – PPO | Admitting: Podiatry

## 2020-04-16 ENCOUNTER — Other Ambulatory Visit: Payer: Self-pay

## 2020-04-16 DIAGNOSIS — Q666 Other congenital valgus deformities of feet: Secondary | ICD-10-CM

## 2020-04-17 ENCOUNTER — Encounter: Payer: Self-pay | Admitting: Podiatry

## 2020-04-17 NOTE — Progress Notes (Signed)
Subjective:  Patient ID: Sylvia Davies, female    DOB: August 14, 1960,  MRN: [id number]  Chief Complaint  Patient presents with  . Callouses    Callus follow up Pt stated that she is doing alot better since she got her inserts     59 y.o. female presents with the above complaint.  Patient presents with follow-up of bilateral hallux IPJ hyperkeratotic lesion secondary to underlying pes planovalgus deformity.  Patient states is doing she is doing a lot better.  She does not have any pain.  The orthotics are functioning well.  No acute concerns.   Review of Systems: Negative except as noted in the HPI. Denies N/V/F/Ch.  Past Medical History:  Diagnosis Date  . ALLERGIC RHINITIS 04/02/2007  . Arthritis   . BREAST BIOPSY, HX OF 04/02/2007  . Diverticulosis   . FIBROCYSTIC BREAST DISEASE, HX OF 04/02/2007  . Hemorrhoids   . HYPERLIPIDEMIA 04/02/2007  . HYPERTENSION, BENIGN ESSENTIAL 05/04/2007  . Impaired glucose tolerance 11/08/2012  . Irritable bowel syndrome 04/02/2007  . MITRAL REGURGITATION, 0 (MILD) 04/02/2007  . OBESITY 04/02/2007    Current Outpatient Medications:  .  amLODipine (NORVASC) 5 MG tablet, Take 1 tablet (5 mg total) by mouth daily., Disp: 90 tablet, Rfl: 3 .  Ascorbic Acid (VITAMIN C) 100 MG tablet, Take 100 mg by mouth daily., Disp: , Rfl:  .  atorvastatin (LIPITOR) 10 MG tablet, TAKE 1 TABLET BY MOUTH EVERY DAY AT 6PM, Disp: 90 tablet, Rfl: 3 .  cetirizine (ZYRTEC) 10 MG tablet, Take 1 tablet (10 mg total) by mouth at bedtime., Disp: 30 tablet, Rfl: 3 .  cholecalciferol (VITAMIN D3) 25 MCG (1000 UT) tablet, Take 1,000 Units by mouth daily., Disp: , Rfl:  .  fluticasone (FLONASE) 50 MCG/ACT nasal spray, SPRAY 2 SPRAYS INTO EACH NOSTRIL EVERY DAY, Disp: 48 mL, Rfl: 2 .  hydrochlorothiazide (HYDRODIURIL) 25 MG tablet, Take 1 tablet (25 mg total) by mouth daily., Disp: 90 tablet, Rfl: 3 .  meloxicam (MOBIC) 15 MG tablet, Take 1 tablet (15 mg total) by mouth daily as needed  for pain., Disp: 30 tablet, Rfl: 2 .  Multiple Vitamins-Minerals (MULTIVITAMIN PO), Take 1 tablet by mouth daily., Disp: , Rfl:   Social History   Tobacco Use  Smoking Status Never Smoker  Smokeless Tobacco Never Used    Allergies  Allergen Reactions  . Amlodipine Besy-Benazepril Hcl Itching    REACTION:sore throat  . Cleocin [Clindamycin Hcl] Itching  . Hyoscyamine   . Penicillins     Has patient had a PCN reaction causing immediate rash, facial/tongue/throat swelling, SOB or lightheadedness with hypotension: yes Has patient had a PCN reaction causing severe rash involving mucus membranes or skin necrosis: no Has patient had a PCN reaction that required hospitalization no Has patient had a PCN reaction occurring within the last 10 years: yes If all of the above answers are "NO", then may proceed with Cephalosporin use.   . Tramadol Hives  . Zylet [Loteprednol-Tobramycin] Itching   Objective:  There were no vitals filed for this visit. There is no height or weight on file to calculate BMI. Constitutional Well developed. Well nourished.  Vascular Dorsalis pedis pulses palpable bilaterally. Posterior tibial pulses palpable bilaterally. Capillary refill normal to all digits.  No cyanosis or clubbing noted. Pedal hair growth normal.  Neurologic Normal speech. Oriented to person, place, and time. Epicritic sensation to light touch grossly present bilaterally.  Dermatologic Nails well groomed and normal in appearance. No open wounds.  No skin lesions.  Orthopedic:  No pain on palpation to the hyperkeratotic lesion to bilateral hallux IPJ.  Gait examination shows calcaneal valgus that is somewhat flexible in nature.  Partially able to recruit the arch.  Patient is able to do single and double heel raises with calcaneus returning to neutral position.  Mild to many toe signs noted.   Radiographs: None Assessment:   1. Pes planovalgus    Plan:  Patient was evaluated and treated  and all questions answered.  Bilateral hallux IPJ calluses secondary to underlying pes planovalgus deformity -Clinically her pain has resolved with underlying hallux IPJ calluses.  I discussed with the patient that if this does recur in the future we can discuss a more conservative treatment option as well as surgical treatment option to prevent the recurrence of the callus formation.  For now I believe patient will continue benefit from orthotics management.  Also discussed with her shoe gear modification as well.  Patient states understanding. -Patient obtain orthotics and seems to be functioning really well.  She states that they seem to be improving the mechanics of her walking.  Overall no acute complaints.  No follow-ups on file.

## 2020-04-22 ENCOUNTER — Encounter: Payer: Self-pay | Admitting: Internal Medicine

## 2020-04-22 ENCOUNTER — Other Ambulatory Visit: Payer: Self-pay

## 2020-04-22 ENCOUNTER — Ambulatory Visit (AMBULATORY_SURGERY_CENTER): Payer: Self-pay | Admitting: *Deleted

## 2020-04-22 VITALS — Ht 62.0 in | Wt 185.0 lb

## 2020-04-22 DIAGNOSIS — Z1211 Encounter for screening for malignant neoplasm of colon: Secondary | ICD-10-CM

## 2020-04-22 MED ORDER — SUTAB 1479-225-188 MG PO TABS: 24.0000 | ORAL_TABLET | ORAL | 0 refills | Status: DC

## 2020-04-22 NOTE — Progress Notes (Signed)

## 2020-05-06 ENCOUNTER — Other Ambulatory Visit: Payer: Self-pay

## 2020-05-06 ENCOUNTER — Ambulatory Visit (AMBULATORY_SURGERY_CENTER): Payer: BC Managed Care – PPO | Admitting: Internal Medicine

## 2020-05-06 ENCOUNTER — Encounter: Payer: Self-pay | Admitting: Internal Medicine

## 2020-05-06 VITALS — BP 127/70 | HR 60 | Temp 97.6°F | Resp 16 | Ht 62.0 in | Wt 185.0 lb

## 2020-05-06 DIAGNOSIS — D123 Benign neoplasm of transverse colon: Secondary | ICD-10-CM

## 2020-05-06 DIAGNOSIS — D125 Benign neoplasm of sigmoid colon: Secondary | ICD-10-CM

## 2020-05-06 DIAGNOSIS — Z1211 Encounter for screening for malignant neoplasm of colon: Secondary | ICD-10-CM

## 2020-05-06 DIAGNOSIS — K635 Polyp of colon: Secondary | ICD-10-CM

## 2020-05-06 MED ORDER — SODIUM CHLORIDE 0.9 % IV SOLN: 500.0000 mL | Freq: Once | INTRAVENOUS | Status: DC

## 2020-05-06 NOTE — Op Note (Signed)
[facility] Patient Name: Sylvia Davies Procedure Date: 05/06/2020 1:22 PM MRN: [id number] Endoscopist: Docia Chuck. Henrene Pastor MD, MD Age: 59 Referring MD:  Date of Birth: 1961-03-25 Gender: Female Account #: [id number] Procedure:                Colonoscopy with cold snare polypectomy x 2 Indications:              Screening for colorectal malignant neoplasm.                            Previous examination June 2011 was negative for                            neoplasia Medicines:                Monitored Anesthesia Care Procedure:                Pre-Anesthesia Assessment:                           - Prior to the procedure, a History and Physical                            was performed, and patient medications and                            allergies were reviewed. The patient's tolerance of                            previous anesthesia was also reviewed. The risks                            and benefits of the procedure and the sedation                            options and risks were discussed with the patient.                            All questions were answered, and informed consent                            was obtained. Prior Anticoagulants: The patient has                            taken no previous anticoagulant or antiplatelet                            agents. ASA Grade Assessment: II - A patient with                            mild systemic disease. After reviewing the risks                            and benefits, the patient was deemed in  satisfactory condition to undergo the procedure.                           After obtaining informed consent, the colonoscope                            was passed under direct vision. Throughout the                            procedure, the patient's blood pressure, pulse, and                            oxygen saturations were monitored continuously. The                            Colonoscope was  introduced through the anus and                            advanced to the the cecum, identified by                            appendiceal orifice and ileocecal valve. The                            ileocecal valve, appendiceal orifice, and rectum                            were photographed. The quality of the bowel                            preparation was excellent. The colonoscopy was                            performed without difficulty. The patient tolerated                            the procedure well. The bowel preparation used was                            SUPREP via split dose instruction. Scope In: 1:41:35 PM Scope Out: 1:53:38 PM Scope Withdrawal Time: 0 hours 8 minutes 47 seconds  Total Procedure Duration: 0 hours 12 minutes 3 seconds  Findings:                 Two polyps were found in the sigmoid colon and                            proximal transverse colon. The polyps were 1 to 3                            mm in size. These polyps were removed with a cold                            snare. Resection and retrieval were  complete.                           The exam was otherwise without abnormality on                            direct and retroflexion views. Complications:            No immediate complications. Estimated blood loss:                            None. Estimated Blood Loss:     Estimated blood loss: none. Impression:               - Two 1 to 3 mm polyps in the sigmoid colon and in                            the proximal transverse colon, removed with a cold                            snare. Resected and retrieved.                           - The examination was otherwise normal on direct                            and retroflexion views. Recommendation:           - Repeat colonoscopy in 7-10 years for surveillance.                           - Patient has a contact number available for                            emergencies. The signs and symptoms of  potential                            delayed complications were discussed with the                            patient. Return to normal activities tomorrow.                            Written discharge instructions were provided to the                            patient.                           - Resume previous diet.                           - Continue present medications.                           - Await pathology results. Docia Chuck. Henrene Pastor MD, MD 05/06/2020 1:59:42 PM This report has been signed electronically.

## 2020-05-06 NOTE — Progress Notes (Signed)
Pt's states no medical or surgical changes since previsit or office visit.   VS taken by CW 

## 2020-05-06 NOTE — Patient Instructions (Signed)
Handout given for polyps.  YOU HAD AN ENDOSCOPIC PROCEDURE TODAY AT [facility]:   Refer to the procedure report that was given to you for any specific questions about what was found during the examination.  If the procedure report does not answer your questions, please call your gastroenterologist to clarify.  If you requested that your care partner not be given the details of your procedure findings, then the procedure report has been included in a sealed envelope for you to review at your convenience later.  YOU SHOULD EXPECT: Some feelings of bloating in the abdomen. Passage of more gas than usual.  Walking can help get rid of the air that was put into your GI tract during the procedure and reduce the bloating. If you had a lower endoscopy (such as a colonoscopy or flexible sigmoidoscopy) you may notice spotting of blood in your stool or on the toilet paper. If you underwent a bowel prep for your procedure, you may not have a normal bowel movement for a few days.  Please Note:  You might notice some irritation and congestion in your nose or some drainage.  This is from the oxygen used during your procedure.  There is no need for concern and it should clear up in a day or so.  SYMPTOMS TO REPORT IMMEDIATELY:   Following lower endoscopy (colonoscopy or flexible sigmoidoscopy):  Excessive amounts of blood in the stool  Significant tenderness or worsening of abdominal pains  Swelling of the abdomen that is new, acute  Fever of 100F or higher  For urgent or emergent issues, a gastroenterologist can be reached at any hour by calling [phone]. Do not use MyChart messaging for urgent concerns.    DIET:  We do recommend a small meal at first, but then you may proceed to your regular diet.  Drink plenty of fluids but you should avoid alcoholic beverages for 24 hours.  ACTIVITY:  You should plan to take it easy for the rest of today and you should NOT DRIVE or use heavy  machinery until tomorrow (because of the sedation medicines used during the test).    FOLLOW UP: Our staff will call the number listed on your records Thursday 10/28 between 715 am and 8 am.  following your procedure to check on you and address any questions or concerns that you may have regarding the information given to you following your procedure. If we do not reach you, we will leave a message.  We will attempt to reach you two times.  During this call, we will ask if you have developed any symptoms of COVID 19. If you develop any symptoms (ie: fever, flu-like symptoms, shortness of breath, cough etc.) before then, please call [phone].  If you test positive for Covid 19 in the 2 weeks post procedure, please call and report this information to [place].    If any biopsies were taken you will be contacted by phone or by letter within the next 1-3 weeks.  Please call us at [phone] if you have not heard about the biopsies in 3 weeks.    SIGNATURES/CONFIDENTIALITY: You and/or your care partner have signed paperwork which will be entered into your electronic medical record.  These signatures attest to the fact that that the information above on your After Visit Summary has been reviewed and is understood.  Full responsibility of the confidentiality of this discharge information lies with you and/or your care-partner.

## 2020-05-06 NOTE — Progress Notes (Addendum)
Called to room to assist during endoscopic procedure.  Patient ID and intended procedure confirmed with present staff. Received instructions for my participation in the procedure from the performing physician.  Did not retrieve sigmoid polyp.  Only retrieved transverse colon polyp.  Dr. Henrene Pastor is aware.

## 2020-05-06 NOTE — Progress Notes (Signed)
Report to PACU, RN, vss, BBS= Clear.  

## 2020-05-08 ENCOUNTER — Telehealth: Payer: Self-pay | Admitting: *Deleted

## 2020-05-08 NOTE — Telephone Encounter (Signed)
1. Have you developed a fever since your procedure? no  2.   Have you had an respiratory symptoms (SOB or cough) since your procedure? no  3.   Have you tested positive for COVID 19 since your procedure no  4.   Have you had any family members/close contacts diagnosed with the COVID 19 since your procedure?  no   If yes to any of these questions please route to Joylene John, RN and Joella Prince, RN Follow up Call-  Call back number 05/06/2020  Post procedure Call Back phone  # [phone]  Permission to leave phone message Yes  Some recent data might be hidden     Patient questions:  Do you have a fever, pain , or abdominal swelling? No. Pain Score  0 *  Have you tolerated food without any problems? Yes.    Have you been able to return to your normal activities? Yes.    Do you have any questions about your discharge instructions: Diet   No. Medications  No. Follow up visit  No.  Do you have questions or concerns about your Care? No.  Actions: * If pain score is 4 or above: No action needed, pain <4.

## 2020-05-12 ENCOUNTER — Encounter: Payer: Self-pay | Admitting: Internal Medicine

## 2020-05-21 ENCOUNTER — Telehealth: Payer: Self-pay

## 2020-05-21 NOTE — Telephone Encounter (Signed)
My patients do not require any antibiotics for dental office visits.

## 2020-05-21 NOTE — Telephone Encounter (Signed)
Note completed saying this and faxed to dental office

## 2020-05-21 NOTE — Telephone Encounter (Signed)
Sharyn Lull from [employer] called she is requesting a written form for pre med so patient can be seen in their dental office, the form can be faxed. Call back:[phone] Fax: [phone]

## 2020-06-30 ENCOUNTER — Other Ambulatory Visit: Payer: Self-pay

## 2020-06-30 ENCOUNTER — Ambulatory Visit: Payer: BC Managed Care – PPO | Admitting: Orthotics

## 2020-06-30 DIAGNOSIS — M7662 Achilles tendinitis, left leg: Secondary | ICD-10-CM

## 2020-06-30 DIAGNOSIS — M722 Plantar fascial fibromatosis: Secondary | ICD-10-CM

## 2020-06-30 DIAGNOSIS — Q666 Other congenital valgus deformities of feet: Secondary | ICD-10-CM

## 2020-06-30 NOTE — Progress Notes (Signed)
Make f/o 3/4 length so have more room in shoe

## 2020-07-01 ENCOUNTER — Ambulatory Visit: Payer: BC Managed Care – PPO | Admitting: Podiatry

## 2020-07-01 DIAGNOSIS — L989 Disorder of the skin and subcutaneous tissue, unspecified: Secondary | ICD-10-CM | POA: Diagnosis not present

## 2020-07-02 ENCOUNTER — Encounter: Payer: Self-pay | Admitting: Podiatry

## 2020-07-02 NOTE — Progress Notes (Signed)
Subjective:  Patient ID: Sylvia Davies, female    DOB: [birth year],  MRN: [id number]  Chief Complaint  Patient presents with  . Callouses    Right hallux callous came back    59 y.o. female presents with the above complaint.  Patient presents with follow-up of bilateral hallux IPJ hyperkeratotic lesion secondary to underlying pes planovalgus deformity.  Patient states is doing she is doing a lot better.  She does not have any pain.  The orthotics are functioning well.  No acute concerns.   Review of Systems: Negative except as noted in the HPI. Denies N/V/F/Ch.  Past Medical History:  Diagnosis Date  . ALLERGIC RHINITIS 04/02/2007  . Allergy   . Arthritis   . BREAST BIOPSY, HX OF 04/02/2007  . Diverticulosis   . FIBROCYSTIC BREAST DISEASE, HX OF 04/02/2007  . GERD (gastroesophageal reflux disease)   . Hemorrhoids   . HYPERLIPIDEMIA 04/02/2007  . HYPERTENSION, BENIGN ESSENTIAL 05/04/2007  . Impaired glucose tolerance 11/08/2012  . Irritable bowel syndrome 04/02/2007  . MITRAL REGURGITATION, 0 (MILD) 04/02/2007  . OBESITY 04/02/2007    Current Outpatient Medications:  .  Acetaminophen (TYLENOL ARTHRITIS PAIN PO), Take by mouth as needed., Disp: , Rfl:  .  amLODipine (NORVASC) 5 MG tablet, Take 1 tablet (5 mg total) by mouth daily., Disp: 90 tablet, Rfl: 3 .  ascorbic acid (VITAMIN C) 250 MG CHEW, Chew 250 mg by mouth daily., Disp: , Rfl:  .  atorvastatin (LIPITOR) 10 MG tablet, TAKE 1 TABLET BY MOUTH EVERY DAY AT 6PM, Disp: 90 tablet, Rfl: 3 .  cetirizine (ZYRTEC) 10 MG tablet, Take 1 tablet (10 mg total) by mouth at bedtime., Disp: 30 tablet, Rfl: 3 .  cholecalciferol (VITAMIN D3) 25 MCG (1000 UT) tablet, Take 1,000 Units by mouth daily., Disp: , Rfl:  .  fluticasone (FLONASE) 50 MCG/ACT nasal spray, SPRAY 2 SPRAYS INTO EACH NOSTRIL EVERY DAY, Disp: 48 mL, Rfl: 2 .  hydrochlorothiazide (HYDRODIURIL) 25 MG tablet, Take 1 tablet (25 mg total) by mouth daily., Disp: 90 tablet, Rfl:  3 .  meloxicam (MOBIC) 15 MG tablet, Take 1 tablet (15 mg total) by mouth daily as needed for pain., Disp: 30 tablet, Rfl: 2 .  Multiple Vitamins-Minerals (MULTIVITAMIN PO), Take 1 tablet by mouth daily., Disp: , Rfl:   Social History   Tobacco Use  Smoking Status Never Smoker  Smokeless Tobacco Never Used    Allergies  Allergen Reactions  . Amlodipine Besy-Benazepril Hcl Itching    REACTION:sore throat  . Cleocin [Clindamycin Hcl] Itching  . Hyoscyamine   . Penicillins     Has patient had a PCN reaction causing immediate rash, facial/tongue/throat swelling, SOB or lightheadedness with hypotension: yes Has patient had a PCN reaction causing severe rash involving mucus membranes or skin necrosis: no Has patient had a PCN reaction that required hospitalization no Has patient had a PCN reaction occurring within the last 10 years: yes If all of the above answers are "NO", then may proceed with Cephalosporin use.   . Tramadol Hives  . Zylet [Loteprednol-Tobramycin] Itching   Objective:  There were no vitals filed for this visit. There is no height or weight on file to calculate BMI. Constitutional Well developed. Well nourished.  Vascular Dorsalis pedis pulses palpable bilaterally. Posterior tibial pulses palpable bilaterally. Capillary refill normal to all digits.  No cyanosis or clubbing noted. Pedal hair growth normal.  Neurologic Normal speech. Oriented to person, place, and time. Epicritic sensation to light touch  grossly present bilaterally.  Dermatologic Nails well groomed and normal in appearance. No open wounds. No skin lesions.  Orthopedic:  No pain on palpation to the hyperkeratotic lesion to bilateral hallux IPJ.  Gait examination shows calcaneal valgus that is somewhat flexible in nature.  Partially able to recruit the arch.  Patient is able to do single and double heel raises with calcaneus returning to neutral position.  Mild to many toe signs noted.    Radiographs: None Assessment:   No diagnosis found. Plan:  Patient was evaluated and treated and all questions answered.  Bilateral hallux IPJ calluses secondary to underlying pes planovalgus deformity -Clinically her pain has resolved with underlying hallux IPJ calluses.  I discussed with the patient that if this does recur in the future we can discuss a more conservative treatment option as well as surgical treatment option to prevent the recurrence of the callus formation.  For now I believe patient will continue benefit from orthotics management.  Also discussed with her shoe gear modification as well.  Patient states understanding. -Patient obtain orthotics and seems to be functioning really well.  She states that they seem to be improving the mechanics of her walking.  Overall no acute complaints.  No follow-ups on file.

## 2020-07-16 ENCOUNTER — Telehealth: Payer: Self-pay | Admitting: Podiatry

## 2020-07-16 NOTE — Telephone Encounter (Signed)
Pt wanted to know what she can do to ease the pain for her callus on her right foot. She is scheduled to come back in April

## 2020-07-29 ENCOUNTER — Ambulatory Visit: Payer: BC Managed Care – PPO | Admitting: Family

## 2020-09-01 ENCOUNTER — Ambulatory Visit: Payer: BC Managed Care – PPO | Admitting: Family

## 2020-09-01 ENCOUNTER — Other Ambulatory Visit: Payer: Self-pay

## 2020-09-01 ENCOUNTER — Encounter: Payer: Self-pay | Admitting: Family

## 2020-09-01 VITALS — BP 122/60 | HR 71 | Temp 98.2°F | Resp 18 | Ht 62.0 in | Wt 183.4 lb

## 2020-09-01 DIAGNOSIS — M79605 Pain in left leg: Secondary | ICD-10-CM

## 2020-09-01 NOTE — Progress Notes (Signed)
Sylvia Davies is a 60 y.o. female with the following history as recorded in EpicCare:  Patient Active Problem List   Diagnosis Date Noted  . Acute upper respiratory infection 09/12/2018  . Status post total replacement of right hip 04/28/2018  . Unilateral primary osteoarthritis, right hip 03/20/2018  . Arthritis of right ankle 05/17/2017  . Stress fracture of right tibia 02/08/2017  . Right knee pain 01/18/2017  . Medication reaction 08/01/2014  . Rash and nonspecific skin eruption 01/06/2014  . Left knee pain 04/11/2013  . Hemorrhoids 12/20/2012  . Contact dermatitis 11/08/2012  . Impaired glucose tolerance 11/08/2012  . Preventative health care 06/29/2011  . LEG PAIN, BILATERAL 03/20/2009  . HYPERTENSION, BENIGN ESSENTIAL 05/04/2007  . Hyperlipidemia 04/02/2007  . OBESITY 04/02/2007  . MIGRAINE HEADACHE 04/02/2007  . MITRAL REGURGITATION, 0 (MILD) 04/02/2007  . Allergic rhinitis 04/02/2007  . GERD 04/02/2007  . Irritable bowel syndrome 04/02/2007  . LOW BACK PAIN 04/02/2007  . FIBROCYSTIC BREAST DISEASE, HX OF 04/02/2007  . BREAST BIOPSY, HX OF 04/02/2007    Current Outpatient Medications  Medication Sig Dispense Refill  . Acetaminophen (TYLENOL ARTHRITIS PAIN PO) Take by mouth as needed.    [name] amLODipine (NORVASC) 5 MG tablet Take 1 tablet (5 mg total) by mouth daily. 90 tablet 3  . ascorbic acid (VITAMIN C) 250 MG CHEW Chew 250 mg by mouth daily.    [name] atorvastatin (LIPITOR) 10 MG tablet TAKE 1 TABLET BY MOUTH EVERY DAY AT 6PM 90 tablet 3  . cetirizine (ZYRTEC) 10 MG tablet Take 1 tablet (10 mg total) by mouth at bedtime. 30 tablet 3  . cholecalciferol (VITAMIN D3) 25 MCG (1000 UT) tablet Take 1,000 Units by mouth daily.    . fluticasone (FLONASE) 50 MCG/ACT nasal spray SPRAY 2 SPRAYS INTO EACH NOSTRIL EVERY DAY 48 mL 2  . hydrochlorothiazide (HYDRODIURIL) 25 MG tablet Take 1 tablet (25 mg total) by mouth daily. 90 tablet 3  . meloxicam (MOBIC) 15 MG tablet Take 1  tablet (15 mg total) by mouth daily as needed for pain. 30 tablet 2  . Multiple Vitamins-Minerals (MULTIVITAMIN PO) Take 1 tablet by mouth daily.    . vitamin B-12 (CYANOCOBALAMIN) 1000 MCG tablet Take 1,000 mcg by mouth daily.    [name] aspirin (BAYER ASPIRIN EC LOW DOSE) 81 MG EC tablet Take 81 mg by mouth daily.     No current facility-administered medications for this visit.    Allergies: Amlodipine besy-benazepril hcl, Cleocin [clindamycin hcl], Hyoscyamine, Penicillins, Tramadol, and Zylet [loteprednol-tobramycin]  Past Medical History:  Diagnosis Date  . ALLERGIC RHINITIS 04/02/2007  . Allergy   . Arthritis   . BREAST BIOPSY, HX OF 04/02/2007  . Diverticulosis   . FIBROCYSTIC BREAST DISEASE, HX OF 04/02/2007  . GERD (gastroesophageal reflux disease)   . Hemorrhoids   . HYPERLIPIDEMIA 04/02/2007  . HYPERTENSION, BENIGN ESSENTIAL 05/04/2007  . Impaired glucose tolerance 11/08/2012  . Irritable bowel syndrome 04/02/2007  . MITRAL REGURGITATION, 0 (MILD) 04/02/2007  . OBESITY 04/02/2007    Past Surgical History:  Procedure Laterality Date  . BREAST BIOPSY Left   . BREAST EXCISIONAL BIOPSY Left    benign  . COLONOSCOPY    . DG 5TH DIGIT LEFT FOOT Left   . DILATATION & CURETTAGE/HYSTEROSCOPY WITH MYOSURE N/A 06/02/2016   Procedure: DILATATION & CURETTAGE/HYSTEROSCOPY WITH MYOSURE;  Surgeon: Thurnell Lose, MD;  Location: [facility];  Service: Gynecology;  Laterality: N/A;  Myosure - Endometrial Mass  . EYE SURGERY Left  tear duct  . JOINT REPLACEMENT     Right total hip Dr. Felicie Morn 04-28-18  . LEG SURGERY Left    have rods and srcews  . TOTAL HIP ARTHROPLASTY Right 04/28/2018   Procedure: RIGHT TOTAL HIP ARTHROPLASTY ANTERIOR APPROACH;  Surgeon: Mcarthur Rossetti, MD;  Location: [facility];  Service: Orthopedics;  Laterality: Right;    Family History  Problem Relation Age of Onset  . Hypertension Mother   . Diabetes Maternal Grandmother   . Colon cancer Neg Hx   . Colon polyps  Neg Hx   . Esophageal cancer Neg Hx   . Rectal cancer Neg Hx   . Stomach cancer Neg Hx     Social History   Tobacco Use  . Smoking status: Never Smoker  . Smokeless tobacco: Never Used  Substance Use Topics  . Alcohol use: No    Subjective:  6 month history of "throbbing" in her left lower leg; similar complaint noted at last OV in August 2021; no swelling; very intermittent symptoms- history of broken leg and does have rod in left lower leg; is trying to exercise more regularly and does feel this has helped improve the pain; was recommended to see sports medicine or orthopedist in August after X-rays were normal at that time;     Objective:  Vitals:   09/01/20 0950  BP: 122/60  Pulse: 71  Resp: 18  Temp: 98.2 F (36.8 C)  TempSrc: Oral  SpO2: 98%  Weight: 183 lb 6.4 oz (83.2 kg)  Height: 5\' 2"  (1.575 m)    General: Well developed, well nourished, in no acute distress  Skin : Warm and dry.  Head: Normocephalic and atraumatic  Lungs: Respirations unlabored;  Musculoskeletal: No deformities; no active joint inflammation  Extremities: No edema, cyanosis, clubbing  Vessels: Symmetric bilaterally  Neurologic: Alert and oriented; speech intact; face symmetrical; moves all extremities well; CNII-XII intact without focal deficit   Assessment:  1. Left leg pain     Plan:   Will check vascular U/S- patient is concerned about possible DVT and agree this is possible but unlikely; U/S scheduled for tomorrow; she will need to see sports medicine or orthopedist most likely;  This visit occurred during the SARS-CoV-2 public health emergency.  Safety protocols were in place, including screening questions prior to the visit, additional usage of staff PPE, and extensive cleaning of exam room while observing appropriate contact time as indicated for disinfecting solutions.     No follow-ups on file.  No orders of the defined types were placed in this encounter.   Requested  Prescriptions    No prescriptions requested or ordered in this encounter

## 2020-09-02 ENCOUNTER — Ambulatory Visit (HOSPITAL_COMMUNITY)
Admission: RE | Admit: 2020-09-02 | Discharge: 2020-09-02 | Disposition: A | Discharge status: Home / Self Care | Payer: BC Managed Care – PPO | Source: Ambulatory Visit | Attending: Cardiovascular Disease | Admitting: Cardiovascular Disease

## 2020-09-02 DIAGNOSIS — M79605 Pain in left leg: Secondary | ICD-10-CM | POA: Insufficient documentation

## 2020-09-03 ENCOUNTER — Ambulatory Visit: Payer: Self-pay | Admitting: Family

## 2020-09-16 ENCOUNTER — Telehealth: Payer: Self-pay | Admitting: Family

## 2020-09-16 NOTE — Telephone Encounter (Signed)
   Patient requesting order for hip xray, states she is "walking funny"

## 2020-09-17 NOTE — Telephone Encounter (Signed)
Pt notified of PCP response & verb understanding. 

## 2020-09-17 NOTE — Telephone Encounter (Signed)
Please keep the planned follow up with sports medicine and they can determine the appropriate imaging for her.

## 2020-10-02 NOTE — Progress Notes (Signed)
I, Sylvia Davies, LAT, ATC, am serving as scribe for Dr. Lynne Leader.  Sylvia Davies is a 60 y.o. female who presents to [facility] today for L leg pain x 6-7 months.  She was last seen by Dr. Georgina Snell on 11/21/19 for R knee pain. She has been seen by her PCP for her new L leg pain c/o on 09/01/20 and referred for a venous doppler US. Of note, pt has hx of fractured L lower leg w/ rod placement in 2000.  She locates her pain to lateral hip and pain aches down through anterior thigh and into ankle. Pt thinks her antalgic gait is contributing to pain. 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  L lower leg swelling: no Aggravating factors: standing Treatments tried: tylenol arthritis, heat, meloxicam - no relief, exercises  Diagnostic testing: L LE venous doppler US- 09/02/20; L knee and L tib/fib XR-03/07/20   Pertinent review of systems: No fevers or chills  Relevant historical information: Mitral regurgitation.  History of right hip replacement   Exam:  BP 128/82 (BP Location: Right Arm, Patient Position: Sitting, Cuff Size: Normal)   Pulse 76   Wt 184 lb (83.5 kg)   LMP 07/31/2013   SpO2 98%   BMI 33.65 kg/m  General: Well Developed, well nourished, and in no acute distress.   MSK: Left hip normal-appearing Decreased range of motion to flexion and rotation. Tender palpation greater trochanter. Reduced strength to hip abduction and external rotation. Pain with flexion and internal rotation.  Spine normal.  Nontender lumbar spine Normal lumbar motion. Negative slump test bilateral lower extremities.   Lab and Radiology Results  X-ray images left hip and lumbar spine obtained today personally and independently interpreted  Left hip: Severe hip OA.  No fractures visible.  Lumbar spine: DDD L5-S1 no fractures.  Await formal radiology review    Assessment and Plan: 60 y.o. female with left leg pain primarily located in groin and  anterior thigh related to hip arthritis.  Additionally patient does have some evidence of hip bursitis and tendinopathy.  Plan for limited trial of physical therapy as this should help hip strength and possibly some range of motion.  Recheck in about a month.  Likely will proceed with trial of diagnostic and therapeutic left hip injection however anticipate left total hip replacement likely this summer.  Trial of gabapentin.  She may have a component of lumbar radiculopathy that may be contributing to some of the pain.  However I believe the majority the pain is related to left hip.   PDMP not reviewed this encounter. Orders Placed This Encounter  Procedures  . DG Hip Unilat W OR W/O Pelvis 2-3 Views Left    Standing Status:   Future    Number of Occurrences:   1    Standing Expiration Date:   10/03/2021    Order Specific Question:   Reason for Exam (SYMPTOM  OR DIAGNOSIS REQUIRED)    Answer:   eval left hip pain    Order Specific Question:   Preferred imaging location?    Answer:   [facility]  . DG Lumbar Spine 2-3 Views    Standing Status:   Future    Number of Occurrences:   1    Standing Expiration Date:   10/03/2021    Order Specific Question:   Reason for Exam (SYMPTOM  OR DIAGNOSIS REQUIRED)    Answer:   eval poss left l4  radi    Order Specific Question:   Preferred imaging location?    Answer:   [facility]  . Ambulatory referral to Physical Therapy    Referral Priority:   Routine    Referral Type:   Physical Medicine    Referral Reason:   Specialty Services Required    Requested Specialty:   Physical Therapy   Meds ordered this encounter  Medications  . gabapentin (NEURONTIN) 100 MG capsule    Sig: Take 1-3 capsules (100-300 mg total) by mouth at bedtime as needed (nerve pain).    Dispense:  90 capsule    Refill:  3     Discussed warning signs or symptoms. Please see discharge instructions. Patient expresses understanding.   The above documentation  has been reviewed and is accurate and complete Lynne Leader, M.D.

## 2020-10-03 ENCOUNTER — Ambulatory Visit (INDEPENDENT_AMBULATORY_CARE_PROVIDER_SITE_OTHER): Payer: BC Managed Care – PPO

## 2020-10-03 ENCOUNTER — Other Ambulatory Visit: Payer: Self-pay

## 2020-10-03 ENCOUNTER — Ambulatory Visit: Payer: Self-pay

## 2020-10-03 ENCOUNTER — Ambulatory Visit: Payer: BC Managed Care – PPO | Admitting: Family Medicine

## 2020-10-03 ENCOUNTER — Ambulatory Visit: Payer: BC Managed Care – PPO | Admitting: Podiatry

## 2020-10-03 VITALS — BP 128/82 | HR 76 | Wt 184.0 lb

## 2020-10-03 DIAGNOSIS — M79605 Pain in left leg: Secondary | ICD-10-CM

## 2020-10-03 DIAGNOSIS — M25552 Pain in left hip: Secondary | ICD-10-CM

## 2020-10-03 MED ORDER — GABAPENTIN 100 MG PO CAPS: 100.0000 mg | ORAL_CAPSULE | Freq: Every evening | ORAL | 3 refills | Status: DC | PRN

## 2020-10-03 NOTE — Patient Instructions (Addendum)
Thank you for coming in today.  Please get an Xray today before you leave  I've referred you to Physical Therapy.  Let us know if you don't hear from them in one week.  Try the gabapentin at bedtime.   Recheck in 1 month.

## 2020-10-06 NOTE — Progress Notes (Signed)
X-ray left hip shows moderate to severe left hip arthritis.  That is probably causing a lot of your hip pain.  I would recommend that he proceed to trial of physical therapy however on recheck in a month if not improved we should try an injection.

## 2020-10-06 NOTE — Progress Notes (Signed)
X-ray lumbar spine shows some mild arthritis

## 2020-10-09 ENCOUNTER — Telehealth: Payer: Self-pay

## 2020-10-09 DIAGNOSIS — M1612 Unilateral primary osteoarthritis, left hip: Secondary | ICD-10-CM

## 2020-10-09 DIAGNOSIS — M25552 Pain in left hip: Secondary | ICD-10-CM

## 2020-10-09 NOTE — Telephone Encounter (Signed)
Patient called wanting to know if she is gong to go ahead and get the hip surgery does she really need to start PT before the surgery to build up strength? Patient was concerned because its 50 dollars each visit and then if she is going a head with the surgery she will get PT afterward.

## 2020-10-09 NOTE — Telephone Encounter (Signed)
You could go directly to surgery if you would like.  Would you like me to place a referral to orthopedic surgery to discuss surgical options?

## 2020-10-10 NOTE — Telephone Encounter (Signed)
Called pt and asked her to return call to confirm if she would like Dr. Georgina Snell to place a referral to Orthopedics for possible surgical consultation.  Please advise and route message directly to Dr. Georgina Snell.

## 2020-10-10 NOTE — Telephone Encounter (Signed)
Patient called back and would like to be referred to orthopedic surgery. Patient states Dr. Ninfa Linden did her last surgery and wants to be referred to his office again.

## 2020-10-13 NOTE — Telephone Encounter (Signed)
Referral placed for Dr. Ninfa Linden.

## 2020-10-13 NOTE — Addendum Note (Signed)
Addended by: Gregor Hams on: 10/13/2020 07:28 AM   Modules accepted: Orders

## 2020-10-24 ENCOUNTER — Ambulatory Visit: Payer: BC Managed Care – PPO | Admitting: Podiatry

## 2020-10-27 ENCOUNTER — Ambulatory Visit: Payer: BC Managed Care – PPO | Admitting: Family Medicine

## 2020-10-27 ENCOUNTER — Encounter: Payer: Self-pay | Admitting: Orthopaedic Surgery

## 2020-10-27 ENCOUNTER — Ambulatory Visit: Payer: BC Managed Care – PPO | Admitting: Orthopaedic Surgery

## 2020-10-27 VITALS — Ht 62.0 in | Wt 184.0 lb

## 2020-10-27 DIAGNOSIS — M1612 Unilateral primary osteoarthritis, left hip: Secondary | ICD-10-CM

## 2020-10-27 NOTE — Progress Notes (Signed)
Office Visit Note   Patient: Sylvia Davies           Date of Birth: 07-Sep-1960           MRN: [id number] Visit Date: 10/27/2020              Requested by: Gregor Hams, MD [place],  [place] [address] PCP: [name], FNP   Assessment & Plan: Visit Diagnoses:  1. Unilateral primary osteoarthritis, left hip     Plan: The patient does have now severe end-stage arthritis of her left hip and I agree with the need for hip replacement surgery.  Having had this before she is fully aware of the risk and benefits of the surgery.  We talked about the interoperative and postoperative course.  She would like this scheduled in June when school was done.  She would also like [place] to specifically put there are no restrictions for her after surgery.  All question concerns were answered and addressed.  I agree with getting this scheduled.  There is no other conservative treatment we could try and given the rapid worsening of her LEFT hip arthritis I do feel this is medically necessary at this standpoint.  Follow-Up Instructions: Return for 2 weeks post-op.   Orders:  No orders of the defined types were placed in this encounter.  No orders of the defined types were placed in this encounter.     Procedures: No procedures performed   Clinical Data: No additional findings.   Subjective: Chief Complaint  Patient presents with  . Left Hip - Pain    Patient reports worse in last few weeks. She did not realize how much the weather played in her pain and stiffness.  The patient is well-known to me.  Actually replaced her right hip in June 2020.  She has developed left hip and groin pain and stiffness.  She has x-rays on the canopy system showing severe end-stage arthritis of the left hip.  Is actually worsened significantly over the last 12 months.  Her previous x-rays of the left hip showed a normal-appearing hip but it is dramatically different now.  She denies any  injury and she is now diabetic.  Her left hip pain is now definitely affecting her mobility, her quality of life and her actives daily living.  It is more difficult getting on her shoes and socks on the left side.  Her right operative hip is doing great and she has no complaints with it at all.  She has had no other acute change in her medical status.  She is not a diabetic.  She does take gabapentin to help her sleep.  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  HPI  Review of Systems She currently denies any headache, chest pain, shortness of breath, fever, chills, nausea, vomiting  Objective: Vital Signs: Ht 5\' 2"  (1.575 m)   Wt 184 lb (83.5 kg)   LMP 07/31/2013   BMI 33.65 kg/m   Physical Exam She is alert and orient x3 and in no acute distress Ortho Exam Examination of her right operative hip shows it moves smoothly and normally.  Her left hip has stiffness with internal and external rotation and severe pain in the groin with attempts of rotation of the left hip. Specialty Comments:  No specialty comments available.  Imaging: No results found. Recent x-rays of her pelvis and  left hip on the canopy system were reviewed and compared to x-rays from June 2020.  She has severe end-stage arthritis of her left hip at this point.  There are cystic changes in the femoral head as well as flattening of the femoral head and complete loss of the joint space.  This is drastically changed from x-rays that were done in June 2020.  PMFS History: Patient Active Problem List   Diagnosis Date Noted  . Unilateral primary osteoarthritis, left hip 10/27/2020  . Acute upper respiratory infection 09/12/2018  . Status post total replacement of right hip 04/28/2018  . Unilateral primary osteoarthritis, right hip 03/20/2018  . Arthritis of right ankle 05/17/2017  . Stress fracture of right tibia 02/08/2017  . Right knee pain 01/18/2017  . Medication  reaction 08/01/2014  . Rash and nonspecific skin eruption 01/06/2014  . Left knee pain 04/11/2013  . Hemorrhoids 12/20/2012  . Contact dermatitis 11/08/2012  . Impaired glucose tolerance 11/08/2012  . Preventative health care 06/29/2011  . LEG PAIN, BILATERAL 03/20/2009  . HYPERTENSION, BENIGN ESSENTIAL 05/04/2007  . Hyperlipidemia 04/02/2007  . OBESITY 04/02/2007  . MIGRAINE HEADACHE 04/02/2007  . MITRAL REGURGITATION, 0 (MILD) 04/02/2007  . Allergic rhinitis 04/02/2007  . GERD 04/02/2007  . Irritable bowel syndrome 04/02/2007  . LOW BACK PAIN 04/02/2007  . FIBROCYSTIC BREAST DISEASE, HX OF 04/02/2007  . BREAST BIOPSY, HX OF 04/02/2007   Past Medical History:  Diagnosis Date  . ALLERGIC RHINITIS 04/02/2007  . Allergy   . Arthritis   . BREAST BIOPSY, HX OF 04/02/2007  . Diverticulosis   . FIBROCYSTIC BREAST DISEASE, HX OF 04/02/2007  . GERD (gastroesophageal reflux disease)   . Hemorrhoids   . HYPERLIPIDEMIA 04/02/2007  . HYPERTENSION, BENIGN ESSENTIAL 05/04/2007  . Impaired glucose tolerance 11/08/2012  . Irritable bowel syndrome 04/02/2007  . MITRAL REGURGITATION, 0 (MILD) 04/02/2007  . OBESITY 04/02/2007    Family History  Problem Relation Age of Onset  . Hypertension Mother   . Diabetes Maternal Grandmother   . Colon cancer Neg Hx   . Colon polyps Neg Hx   . Esophageal cancer Neg Hx   . Rectal cancer Neg Hx   . Stomach cancer Neg Hx     Past Surgical History:  Procedure Laterality Date  . BREAST BIOPSY Left   . BREAST EXCISIONAL BIOPSY Left    benign  . COLONOSCOPY    . DG 5TH DIGIT LEFT FOOT Left   . DILATATION & CURETTAGE/HYSTEROSCOPY WITH MYOSURE N/A 06/02/2016   Procedure: DILATATION & CURETTAGE/HYSTEROSCOPY WITH MYOSURE;  Surgeon: Thurnell Lose, MD;  Location: [facility];  Service: Gynecology;  Laterality: N/A;  Myosure - Endometrial Mass  . EYE SURGERY Left    tear duct  . JOINT REPLACEMENT     Right total hip Dr. Felicie Morn 04-28-18  . LEG SURGERY Left     have rods and srcews  . TOTAL HIP ARTHROPLASTY Right 04/28/2018   Procedure: RIGHT TOTAL HIP ARTHROPLASTY ANTERIOR APPROACH;  Surgeon: Mcarthur Rossetti, MD;  Location: [facility];  Service: Orthopedics;  Laterality: Right;   Social History   Occupational History  . Occupation: Photographer: [employer]  Tobacco Use  . Smoking status: Never Smoker  . Smokeless tobacco: Never Used  Vaping Use  . Vaping Use: Never used  Substance and Sexual Activity  . Alcohol use: No  . Drug use: No  . Sexual activity: Yes    Birth control/protection: Post-menopausal

## 2020-10-29 ENCOUNTER — Ambulatory Visit: Payer: BC Managed Care – PPO | Admitting: Podiatry

## 2020-10-31 ENCOUNTER — Ambulatory Visit: Payer: BC Managed Care – PPO | Admitting: Podiatry

## 2020-10-31 ENCOUNTER — Ambulatory Visit: Payer: BC Managed Care – PPO | Admitting: Family Medicine

## 2020-11-17 ENCOUNTER — Other Ambulatory Visit: Payer: Self-pay

## 2020-11-18 ENCOUNTER — Other Ambulatory Visit: Payer: Self-pay | Admitting: Family

## 2020-11-18 DIAGNOSIS — Z1231 Encounter for screening mammogram for malignant neoplasm of breast: Secondary | ICD-10-CM

## 2020-12-22 ENCOUNTER — Other Ambulatory Visit: Payer: Self-pay | Admitting: Physician Assistant

## 2020-12-24 ENCOUNTER — Ambulatory Visit
Admission: RE | Admit: 2020-12-24 | Discharge: 2020-12-24 | Disposition: A | Discharge status: Home / Self Care | Payer: BC Managed Care – PPO | Source: Ambulatory Visit | Attending: Family | Admitting: Family

## 2020-12-24 ENCOUNTER — Other Ambulatory Visit: Payer: Self-pay

## 2020-12-24 DIAGNOSIS — Z1231 Encounter for screening mammogram for malignant neoplasm of breast: Secondary | ICD-10-CM

## 2020-12-24 NOTE — Patient Instructions (Addendum)
DUE TO COVID-19 ONLY ONE VISITOR IS ALLOWED TO COME WITH YOU AND STAY IN THE WAITING ROOM ONLY DURING PRE OP AND PROCEDURE.    **NO VISITORS ARE ALLOWED IN THE SHORT STAY AREA OR RECOVERY ROOM!!**  IF YOU WILL BE ADMITTED INTO THE HOSPITAL YOU ARE ALLOWED ONLY TWO SUPPORT PEOPLE DURING VISITATION HOURS ONLY (10AM -8PM)   The support person(s) may change daily. The support person(s) must pass our screening, gel in and out, and wear a mask at all times, including in the patient's room. Patients must also wear a mask when staff or their support person are in the room.  No visitors under the age of 66. Any visitor under the age of 42 must be accompanied by an adult.    COVID SWAB TESTING MUST BE COMPLETED ON:  12/31/20 at 9:00 am    [address] [place], [place] [address]   You are not required to quarantine, however you are required to wear a well-fitted mask when you are out and around people not in your household.  Hand Hygiene often Do NOT share personal items Notify your provider if you are in close contact with someone who has COVID or you develop fever 100.4 or greater, new onset of sneezing, cough, sore throat, shortness of breath or body aches.  [address] [place], [address], must go inside of the hospital, NOT A DRIVE THRU!  (Must self quarantine after testing. Follow instructions on handout.)       Your procedure is scheduled on: 01/02/21   Report to [facility] Main  Entrance       Report to admitting at 6:20 AM   Call this number if you have problems the morning of surgery [phone]   Do not eat food :After Midnight.   May have liquids until   5:30 am day of surgery  CLEAR LIQUID DIET  Foods Allowed                                                                     Foods Excluded  Water, Black Coffee and tea, regular and decaf                             liquids that you cannot  Plain Jell-O in  any flavor  (No red)                                           see through such as: Fruit ices (not with fruit pulp)                                     milk, soups, orange juice              Iced Popsicles (No red)  All solid food                                   Apple juices Sports drinks like Gatorade (No red) Lightly seasoned clear broth or consume(fat free) Sugar, honey syrup           The day of surgery:  Drink ONE (1) Pre-Surgery Clear Ensure or G2 by 5:30 am the morning of surgery. Drink in one sitting. Do not sip.  This drink was given to you during your hospital  pre-op appointment visit. Nothing else to drink after completing the  Pre-Surgery Clear Ensure or G2.          If you have questions, please contact your surgeon's office.     Oral Hygiene is also important to reduce your risk of infection.                                    Remember - BRUSH YOUR TEETH THE MORNING OF SURGERY WITH YOUR REGULAR TOOTHPASTE    Do NOT smoke after Midnight   Take these medicines the morning of surgery with A SIP OF WATER: Amlodipine , Flonase if needed                                You may not have any metal on your body including hair pins, jewelry, and body piercing             Do not wear make-up, lotions, powders, perfumes/cologne, or deodorant  Do not wear nail polish including gel and S&S, artificial/acrylic nails, or any other type of covering on natural nails including finger and toenails. If you have artificial nails, gel coating, etc. that needs to be removed by a nail salon please have this removed prior to surgery or surgery may need to be canceled/ delayed if the surgeon/ anesthesia feels like they are unable to be safely monitored.   Do not shave  48 hours prior to surgery.     Do not bring valuables to the hospital. [place].   Contacts, dentures or bridgework may not be worn  into surgery.   Bring small overnight bag day of surgery.    Patients discharged the day of surgery will not be allowed to drive home.   Special Instructions: Bring a copy of your healthcare power of attorney and living will documents    the day of surgery if you haven't scanned them in before.              Please read over the following fact sheets you were given:  IF YOU HAVE QUESTIONS ABOUT YOUR PRE OP INSTRUCTIONS PLEASE CALL [phone]    [facility] - Preparing for Surgery Before surgery, you can play an important role.  Because skin is not sterile, your skin needs to be as free of germs as possible.  You can reduce the number of germs on your skin by washing with CHG (chlorahexidine gluconate) soap before surgery.  CHG is an antiseptic cleaner which kills germs and bonds with the skin to continue killing germs even after washing. Please DO NOT use if you have an allergy to CHG or  antibacterial soaps.  If your skin becomes reddened/irritated stop using the CHG and inform your nurse when you arrive at Short Stay. Do not shave (including legs and underarms) for at least 48 hours prior to the first CHG shower.   Please follow these instructions carefully:  1.  Shower with CHG Soap the night before surgery and the  morning of Surgery.  2.  If you choose to wash your hair, wash your hair first as usual with your  normal  shampoo.  3.  After you shampoo, rinse your hair and body thoroughly to remove the  shampoo.                                       4.  Use CHG as you would any other liquid soap.  You can apply chg directly  to the skin and wash                       Gently with a scrungie or clean washcloth.  5.  Apply the CHG Soap to your body ONLY FROM THE NECK DOWN.   Do not use on face/ open                           Wound or open sores. Avoid contact with eyes, ears mouth and genitals (private parts).                       Wash face,  Genitals (private parts) with your normal  soap.             6.  Wash thoroughly, paying special attention to the area where your surgery  will be performed.  7.  Thoroughly rinse your body with warm water from the neck down.  8.  DO NOT shower/wash with your normal soap after using and rinsing off  the CHG Soap.             9.  Pat yourself dry with a clean towel.            10.  Wear clean pajamas.            11.  Place clean sheets on your bed the night of your first shower and do not  sleep with pets. Day of Surgery : Do not apply any lotions/deodorants the morning of surgery.  Please wear clean clothes to the hospital/surgery center.  FAILURE TO FOLLOW THESE INSTRUCTIONS MAY RESULT IN THE CANCELLATION OF YOUR SURGERY PATIENT SIGNATURE_________________________________  NURSE SIGNATURE__________________________________  ________________________________________________________________________   [name]  An incentive spirometer is a tool that can help keep your lungs clear and active. This tool measures how well you are filling your lungs with each breath. Taking long deep breaths may help reverse or decrease the chance of developing breathing (pulmonary) problems (especially infection) following: A long period of time when you are unable to move or be active. BEFORE THE PROCEDURE  If the spirometer includes an indicator to show your best effort, your nurse or respiratory therapist will set it to a desired goal. If possible, sit up straight or lean slightly forward. Try not to slouch. Hold the incentive spirometer in an upright position. INSTRUCTIONS FOR USE  Sit on the edge of your bed if possible, or sit up as far as you can in bed  or on a chair. Hold the incentive spirometer in an upright position. Breathe out normally. Place the mouthpiece in your mouth and seal your lips tightly around it. Breathe in slowly and as deeply as possible, raising the piston or the ball toward the top of the column. Hold your  breath for 3-5 seconds or for as long as possible. Allow the piston or ball to fall to the bottom of the column. Remove the mouthpiece from your mouth and breathe out normally. Rest for a few seconds and repeat Steps 1 through 7 at least 10 times every 1-2 hours when you are awake. Take your time and take a few normal breaths between deep breaths. The spirometer may include an indicator to show your best effort. Use the indicator as a goal to work toward during each repetition. After each set of 10 deep breaths, practice coughing to be sure your lungs are clear. If you have an incision (the cut made at the time of surgery), support your incision when coughing by placing a pillow or rolled up towels firmly against it. Once you are able to get out of bed, walk around indoors and cough well. You may stop using the incentive spirometer when instructed by your caregiver.  RISKS AND COMPLICATIONS Take your time so you do not get dizzy or light-headed. If you are in pain, you may need to take or ask for pain medication before doing incentive spirometry. It is harder to take a deep breath if you are having pain. AFTER USE Rest and breathe slowly and easily. It can be helpful to keep track of a log of your progress. Your caregiver can provide you with a simple table to help with this. If you are using the spirometer at home, follow these instructions: [place] IF:  You are having difficultly using the spirometer. You have trouble using the spirometer as often as instructed. Your pain medication is not giving enough relief while using the spirometer. You develop fever of 100.5 F (38.1 C) or higher. SEEK IMMEDIATE MEDICAL CARE IF:  You cough up bloody sputum that had not been present before. You develop fever of 102 F (38.9 C) or greater. You develop worsening pain at or near the incision site. MAKE SURE YOU:  Understand these instructions. Will watch your condition. Will get help right  away if you are not doing well or get worse. Document Released: 11/08/2006 Document Revised: 09/20/2011 Document Reviewed: 01/09/2007 [facility] Patient Information 2014 [place], [place].   ________________________________________________________________________

## 2020-12-25 ENCOUNTER — Encounter (HOSPITAL_COMMUNITY): Payer: Self-pay

## 2020-12-25 ENCOUNTER — Other Ambulatory Visit: Payer: Self-pay

## 2020-12-25 ENCOUNTER — Encounter (HOSPITAL_COMMUNITY)
Admission: RE | Admit: 2020-12-25 | Discharge: 2020-12-25 | Disposition: A | Discharge status: Home / Self Care | Payer: BC Managed Care – PPO | Source: Ambulatory Visit | Attending: Orthopaedic Surgery | Admitting: Orthopaedic Surgery

## 2020-12-25 DIAGNOSIS — Z01818 Encounter for other preprocedural examination: Secondary | ICD-10-CM | POA: Insufficient documentation

## 2020-12-25 HISTORY — DX: Cardiac murmur, unspecified: R01.1

## 2020-12-25 LAB — CBC
HCT: 41 % (ref 36.0–46.0)
Hemoglobin: 13.3 g/dL (ref 12.0–15.0)
MCH: 27.3 pg (ref 26.0–34.0)
MCHC: 32.4 g/dL (ref 30.0–36.0)
MCV: 84 fL (ref 80.0–100.0)
Platelets: 265 10*3/uL (ref 150–400)
RBC: 4.88 MIL/uL (ref 3.87–5.11)
RDW: 15.1 % (ref 11.5–15.5)
WBC: 5.4 10*3/uL (ref 4.0–10.5)
nRBC: 0 % (ref 0.0–0.2)

## 2020-12-25 LAB — BASIC METABOLIC PANEL
Anion gap: 7 (ref 5–15)
BUN: 10 mg/dL (ref 8–23)
CO2: 28 mmol/L (ref 22–32)
Calcium: 9.4 mg/dL (ref 8.9–10.3)
Chloride: 102 mmol/L (ref 98–111)
Creatinine, Ser: 0.93 mg/dL (ref 0.44–1.00)
GFR, Estimated: >60 mL/min (ref >60)
Glucose, Bld: 92 mg/dL (ref 70–99)
Potassium: 3.7 mmol/L (ref 3.5–5.1)
Sodium: 137 mmol/L (ref 135–145)

## 2020-12-25 LAB — SURGICAL PCR SCREEN
MRSA, PCR: NEGATIVE
Staphylococcus aureus: NEGATIVE

## 2020-12-25 NOTE — Progress Notes (Addendum)
COVID Vaccine Completed:Yes Date COVID Vaccine completed:09/29/19-booster 07/01/20 COVID vaccine manufacturer: Pfizer  &  Moderna     PCP - Dr. Mindi Slicker [place] 01/28/20 Cardiologist - none  Chest x-ray - no EKG - 12/25/20-chart Stress Test - no ECHO - 05/23/19-epic done for  mild mitral valve  regurgitation. Everything checked out okay. Cardiac Cath - no Pacemaker/ICD device last checked:NA  Sleep Study - no CPAP -   Fasting Blood Sugar - NA Checks Blood Sugar _____ times a day  Blood Thinner Instructions:ASA 81/ Dr. Valere Dross Aspirin Instructions: Stop 5 or 7 days prior to DOS. Pt will check paperwork when home. Last Dose:6/17/2  Anesthesia review:   Patient denies shortness of breath, fever, cough and chest pain at PAT appointment Yes. Pt has no SOB climbing 3 flights of stairs, doing housework or with ADLs.  Patient verbalized understanding of instructions that were given to them at the PAT appointment. Patient was also instructed that they will need to review over the PAT instructions again at home before surgery. Yes

## 2020-12-31 ENCOUNTER — Other Ambulatory Visit (HOSPITAL_COMMUNITY)
Admission: RE | Admit: 2020-12-31 | Discharge: 2020-12-31 | Disposition: A | Discharge status: Home / Self Care | Payer: BC Managed Care – PPO | Source: Ambulatory Visit | Attending: Orthopaedic Surgery | Admitting: Orthopaedic Surgery

## 2020-12-31 DIAGNOSIS — Z20822 Contact with and (suspected) exposure to covid-19: Secondary | ICD-10-CM | POA: Insufficient documentation

## 2020-12-31 DIAGNOSIS — Z01812 Encounter for preprocedural laboratory examination: Secondary | ICD-10-CM | POA: Diagnosis not present

## 2020-12-31 LAB — SARS CORONAVIRUS 2 (TAT 6-24 HRS): SARS Coronavirus 2: NEGATIVE

## 2021-01-01 NOTE — H&P (Signed)
TOTAL HIP ADMISSION H&P  Patient is admitted for left total hip arthroplasty.  Subjective:  Chief Complaint: left hip pain  HPI: Sylvia Davies, 60 y.o. female, has a history of pain and functional disability in the left hip(s) due to arthritis and patient has failed non-surgical conservative treatments for greater than 12 weeks to include NSAID's and/or analgesics, corticosteriod injections, flexibility and strengthening excercises, weight reduction as appropriate, and activity modification.  Onset of symptoms was gradual starting 1 years ago with gradually worsening course since that time.The patient noted no past surgery on the left hip(s).  Patient currently rates pain in the left hip at 10 out of 10 with activity. Patient has night pain, worsening of pain with activity and weight bearing, trendelenberg gait, pain that interfers with activities of daily living, pain with passive range of motion, and crepitus. Patient has evidence of subchondral cysts, subchondral sclerosis, periarticular osteophytes, and joint space narrowing by imaging studies. This condition presents safety issues increasing the risk of falls.  There is no current active infection.  Patient Active Problem List   Diagnosis Date Noted   Unilateral primary osteoarthritis, left hip 10/27/2020   Acute upper respiratory infection 09/12/2018   Status post total replacement of right hip 04/28/2018   Unilateral primary osteoarthritis, right hip 03/20/2018   Arthritis of right ankle 05/17/2017   Stress fracture of right tibia 02/08/2017   Right knee pain 01/18/2017   Medication reaction 08/01/2014   Rash and nonspecific skin eruption 01/06/2014   Left knee pain 04/11/2013   Hemorrhoids 12/20/2012   Contact dermatitis 11/08/2012   Impaired glucose tolerance 11/08/2012   Preventative health care 06/29/2011   LEG PAIN, BILATERAL 03/20/2009   HYPERTENSION, BENIGN ESSENTIAL 05/04/2007   Hyperlipidemia 04/02/2007   OBESITY  04/02/2007   MIGRAINE HEADACHE 04/02/2007   MITRAL REGURGITATION, 0 (MILD) 04/02/2007   Allergic rhinitis 04/02/2007   GERD 04/02/2007   Irritable bowel syndrome 04/02/2007   LOW BACK PAIN 04/02/2007   FIBROCYSTIC BREAST DISEASE, HX OF 04/02/2007   BREAST BIOPSY, HX OF 04/02/2007   Past Medical History:  Diagnosis Date   ALLERGIC RHINITIS 04/02/2007   Allergy    Arthritis    BREAST BIOPSY, HX OF 04/02/2007   Diverticulosis    FIBROCYSTIC BREAST DISEASE, HX OF 04/02/2007   GERD (gastroesophageal reflux disease)    Heart murmur    Hemorrhoids    HYPERLIPIDEMIA 04/02/2007   HYPERTENSION, BENIGN ESSENTIAL 05/04/2007   Impaired glucose tolerance 11/08/2012   Irritable bowel syndrome 04/02/2007   MITRAL REGURGITATION, 0 (MILD) 04/02/2007   OBESITY 04/02/2007    Past Surgical History:  Procedure Laterality Date   BREAST BIOPSY Left    BREAST EXCISIONAL BIOPSY Left    benign   COLONOSCOPY     DG 5TH DIGIT LEFT FOOT Left    DILATATION & CURETTAGE/HYSTEROSCOPY WITH MYOSURE N/A 06/02/2016   Procedure: DILATATION & CURETTAGE/HYSTEROSCOPY WITH MYOSURE;  Surgeon: Thurnell Lose, MD;  Location: [facility];  Service: Gynecology;  Laterality: N/A;  Myosure - Endometrial Mass   EYE SURGERY Left    tear duct   JOINT REPLACEMENT     Right total hip Dr. Felicie Morn 04-28-18   LEG SURGERY Left    have rods and srcews   TOTAL HIP ARTHROPLASTY Right 04/28/2018   Procedure: RIGHT TOTAL HIP ARTHROPLASTY ANTERIOR APPROACH;  Surgeon: Mcarthur Rossetti, MD;  Location: [facility];  Service: Orthopedics;  Laterality: Right;    No current facility-administered medications for this encounter.   Current  Outpatient Medications  Medication Sig Dispense Refill Last Dose   acetaminophen (TYLENOL) 650 MG CR tablet Take 1,300 mg by mouth every 8 (eight) hours as needed for pain.      amLODipine (NORVASC) 5 MG tablet Take 1 tablet (5 mg total) by mouth daily. 90 tablet 3    aspirin 81 MG EC tablet Take 81 mg  by mouth daily.      atorvastatin (LIPITOR) 10 MG tablet TAKE 1 TABLET BY MOUTH EVERY DAY AT 6PM (Patient taking differently: Take 10 mg by mouth every evening. TAKE 1 TABLET BY MOUTH EVERY DAY AT 6PM) 90 tablet 3    cetirizine (ZYRTEC) 10 MG tablet Take 1 tablet (10 mg total) by mouth at bedtime. (Patient taking differently: Take 10 mg by mouth 2 (two) times daily.) 30 tablet 3    cholecalciferol (VITAMIN D3) 25 MCG (1000 UT) tablet Take 1,000 Units by mouth daily.      fluticasone (FLONASE) 50 MCG/ACT nasal spray SPRAY 2 SPRAYS INTO EACH NOSTRIL EVERY DAY (Patient taking differently: Place 2 sprays into both nostrils in the morning and at bedtime.) 48 mL 2    hydrochlorothiazide (HYDRODIURIL) 25 MG tablet Take 1 tablet (25 mg total) by mouth daily. 90 tablet 3    Multiple Vitamins-Minerals (MULTIVITAMIN PO) Take 1 tablet by mouth daily.      vitamin B-12 (CYANOCOBALAMIN) 1000 MCG tablet Take 1,000 mcg by mouth daily.      gabapentin (NEURONTIN) 100 MG capsule Take 1-3 capsules (100-300 mg total) by mouth at bedtime as needed (nerve pain). (Patient not taking: No sig reported) 90 capsule 3 Not Taking   Allergies  Allergen Reactions   Tramadol Hives   Amlodipine Besy-Benazepril Hcl Itching    REACTION:sore throat   Penicillins     Has patient had a PCN reaction causing immediate rash, facial/tongue/throat swelling, SOB or lightheadedness with hypotension: yes Has patient had a PCN reaction causing severe rash involving mucus membranes or skin necrosis: no Has patient had a PCN reaction that required hospitalization no Has patient had a PCN reaction occurring within the last 10 years: yes If all of the above answers are "NO", then may proceed with Cephalosporin use.    Cleocin [Clindamycin Hcl] Itching   Hyoscyamine Other (See Comments)    Intolerance    Zylet [Loteprednol-Tobramycin] Itching    Social History   Tobacco Use   Smoking status: Never   Smokeless tobacco: Never  Substance  Use Topics   Alcohol use: No    Family History  Problem Relation Age of Onset   Hypertension Mother    Diabetes Maternal Grandmother    Colon cancer Neg Hx    Colon polyps Neg Hx    Esophageal cancer Neg Hx    Rectal cancer Neg Hx    Stomach cancer Neg Hx      Review of Systems  Musculoskeletal:  Positive for gait problem.  All other systems reviewed and are negative.  Objective:  Physical Exam Vitals reviewed.  Constitutional:      Appearance: Normal appearance.  HENT:     Head: Normocephalic and atraumatic.  Eyes:     Extraocular Movements: Extraocular movements intact.     Pupils: Pupils are equal, round, and reactive to light.  Cardiovascular:     Rate and Rhythm: Normal rate and regular rhythm.     Pulses: Normal pulses.  Pulmonary:     Effort: Pulmonary effort is normal.     Breath sounds: Normal breath  sounds.  Abdominal:     Palpations: Abdomen is soft.  Musculoskeletal:     Cervical back: Normal range of motion and neck supple.     Left hip: Tenderness and bony tenderness present. Decreased range of motion. Decreased strength.  Neurological:     Mental Status: She is alert and oriented to person, place, and time.  Psychiatric:        Behavior: Behavior normal.    Vital signs in last 24 hours:    Labs:   Estimated body mass index is 33.84 kg/m as calculated from the following:   Height as of 12/25/20: 5\' 2"  (1.575 m).   Weight as of 12/25/20: 83.9 kg.   Imaging Review Plain radiographs demonstrate severe degenerative joint disease of the left hip(s). The bone quality appears to be excellent for age and reported activity level.      Assessment/Plan:  End stage arthritis, left hip(s)  The patient history, physical examination, clinical judgement of the provider and imaging studies are consistent with end stage degenerative joint disease of the left hip(s) and total hip arthroplasty is deemed medically necessary. The treatment options including  medical management, injection therapy, arthroscopy and arthroplasty were discussed at length. The risks and benefits of total hip arthroplasty were presented and reviewed. The risks due to aseptic loosening, infection, stiffness, dislocation/subluxation,  thromboembolic complications and other imponderables were discussed.  The patient acknowledged the explanation, agreed to proceed with the plan and consent was signed. Patient is being admitted for inpatient treatment for surgery, pain control, PT, OT, prophylactic antibiotics, VTE prophylaxis, progressive ambulation and ADL's and discharge planning.The patient is planning to be discharged home with home health services

## 2021-01-02 ENCOUNTER — Encounter (HOSPITAL_COMMUNITY): Payer: Self-pay | Admitting: Orthopaedic Surgery

## 2021-01-02 ENCOUNTER — Encounter (HOSPITAL_COMMUNITY)
Admission: RE | Disposition: A | Discharge status: Home / Self Care | Payer: Self-pay | Source: Ambulatory Visit | Attending: Orthopaedic Surgery

## 2021-01-02 ENCOUNTER — Other Ambulatory Visit: Payer: Self-pay

## 2021-01-02 ENCOUNTER — Ambulatory Visit (HOSPITAL_COMMUNITY): Payer: BC Managed Care – PPO

## 2021-01-02 ENCOUNTER — Ambulatory Visit (HOSPITAL_COMMUNITY): Payer: BC Managed Care – PPO | Admitting: Registered Nurse

## 2021-01-02 ENCOUNTER — Observation Stay (HOSPITAL_COMMUNITY)
Admission: RE | Admit: 2021-01-02 | Discharge: 2021-01-03 | Disposition: A | Discharge status: Home / Self Care | Payer: BC Managed Care – PPO | Source: Ambulatory Visit | Attending: Orthopaedic Surgery | Admitting: Orthopaedic Surgery

## 2021-01-02 ENCOUNTER — Ambulatory Visit (HOSPITAL_COMMUNITY): Payer: BC Managed Care – PPO | Admitting: Physician Assistant

## 2021-01-02 ENCOUNTER — Observation Stay (HOSPITAL_COMMUNITY): Payer: BC Managed Care – PPO

## 2021-01-02 DIAGNOSIS — Z96641 Presence of right artificial hip joint: Secondary | ICD-10-CM | POA: Insufficient documentation

## 2021-01-02 DIAGNOSIS — Z96642 Presence of left artificial hip joint: Secondary | ICD-10-CM

## 2021-01-02 DIAGNOSIS — Z79899 Other long term (current) drug therapy: Secondary | ICD-10-CM | POA: Diagnosis not present

## 2021-01-02 DIAGNOSIS — Z419 Encounter for procedure for purposes other than remedying health state, unspecified: Secondary | ICD-10-CM

## 2021-01-02 DIAGNOSIS — M1612 Unilateral primary osteoarthritis, left hip: Secondary | ICD-10-CM | POA: Diagnosis present

## 2021-01-02 DIAGNOSIS — Z7982 Long term (current) use of aspirin: Secondary | ICD-10-CM | POA: Insufficient documentation

## 2021-01-02 DIAGNOSIS — I10 Essential (primary) hypertension: Secondary | ICD-10-CM | POA: Diagnosis not present

## 2021-01-02 HISTORY — PX: TOTAL HIP ARTHROPLASTY: SHX124

## 2021-01-02 LAB — TYPE AND SCREEN
ABO/RH(D): A POS
Antibody Screen: NEGATIVE

## 2021-01-02 LAB — ABO/RH: ABO/RH(D): A POS

## 2021-01-02 SURGERY — ARTHROPLASTY, HIP, TOTAL, ANTERIOR APPROACH
Anesthesia: Monitor Anesthesia Care | Site: Hip | Laterality: Left

## 2021-01-02 MED ORDER — ALUM & MAG HYDROXIDE-SIMETH 200-200-20 MG/5ML PO SUSP: 30.0000 mL | ORAL | Status: DC | PRN

## 2021-01-02 MED ORDER — ONDANSETRON HCL 4 MG PO TABS
4.0000 mg | ORAL_TABLET | Freq: Four times a day (QID) | ORAL | Status: DC | PRN
  Filled 2021-01-02: qty 1

## 2021-01-02 MED ORDER — EPHEDRINE SULFATE-NACL 50-0.9 MG/10ML-% IV SOSY
PREFILLED_SYRINGE | INTRAVENOUS | Status: DC | PRN
  Administered 2021-01-02: 5 mg via INTRAVENOUS

## 2021-01-02 MED ORDER — FENTANYL CITRATE (PF) 100 MCG/2ML IJ SOLN
INTRAMUSCULAR | Status: DC | PRN
  Administered 2021-01-02: 50 ug via INTRAVENOUS

## 2021-01-02 MED ORDER — PROPOFOL 1000 MG/100ML IV EMUL
INTRAVENOUS | Status: AC
  Filled 2021-01-02: qty 100

## 2021-01-02 MED ORDER — ORAL CARE MOUTH RINSE: 15.0000 mL | Freq: Once | OROMUCOSAL | Status: AC

## 2021-01-02 MED ORDER — VANCOMYCIN HCL IN DEXTROSE 1-5 GM/200ML-% IV SOLN
1000.0000 mg | Freq: Two times a day (BID) | INTRAVENOUS | Status: AC
  Administered 2021-01-02: 1000 mg via INTRAVENOUS
  Filled 2021-01-02: qty 200

## 2021-01-02 MED ORDER — PROMETHAZINE HCL 25 MG/ML IJ SOLN: 6.2500 mg | INTRAMUSCULAR | Status: DC | PRN

## 2021-01-02 MED ORDER — METOCLOPRAMIDE HCL 5 MG/ML IJ SOLN
5.0000 mg | Freq: Three times a day (TID) | INTRAMUSCULAR | Status: DC | PRN
  Administered 2021-01-02: 10 mg via INTRAVENOUS
  Filled 2021-01-02: qty 2

## 2021-01-02 MED ORDER — KETOROLAC TROMETHAMINE 30 MG/ML IJ SOLN: 30.0000 mg | Freq: Once | INTRAMUSCULAR | Status: DC | PRN

## 2021-01-02 MED ORDER — CEFAZOLIN SODIUM-DEXTROSE 2-4 GM/100ML-% IV SOLN
2.0000 g | Freq: Once | INTRAVENOUS | Status: AC
  Administered 2021-01-02: 2 g via INTRAVENOUS
  Filled 2021-01-02: qty 100

## 2021-01-02 MED ORDER — MEPERIDINE HCL 50 MG/ML IJ SOLN: 6.2500 mg | INTRAMUSCULAR | Status: DC | PRN

## 2021-01-02 MED ORDER — DEXAMETHASONE SODIUM PHOSPHATE 10 MG/ML IJ SOLN
INTRAMUSCULAR | Status: DC | PRN
  Administered 2021-01-02: 6 mg via INTRAVENOUS

## 2021-01-02 MED ORDER — PHENOL 1.4 % MT LIQD: 1.0000 | OROMUCOSAL | Status: DC | PRN

## 2021-01-02 MED ORDER — DEXAMETHASONE SODIUM PHOSPHATE 10 MG/ML IJ SOLN
INTRAMUSCULAR | Status: AC
  Filled 2021-01-02: qty 1

## 2021-01-02 MED ORDER — ONDANSETRON HCL 4 MG/2ML IJ SOLN
INTRAMUSCULAR | Status: AC
  Filled 2021-01-02: qty 2

## 2021-01-02 MED ORDER — METHOCARBAMOL 500 MG PO TABS
500.0000 mg | ORAL_TABLET | Freq: Four times a day (QID) | ORAL | Status: DC | PRN
  Administered 2021-01-02 – 2021-01-03 (×3): 500 mg via ORAL
  Filled 2021-01-02 (×3): qty 1

## 2021-01-02 MED ORDER — OXYCODONE HCL 5 MG PO TABS: 5.0000 mg | ORAL_TABLET | Freq: Once | ORAL | Status: DC | PRN

## 2021-01-02 MED ORDER — DIPHENHYDRAMINE HCL 12.5 MG/5ML PO ELIX: 12.5000 mg | ORAL_SOLUTION | ORAL | Status: DC | PRN

## 2021-01-02 MED ORDER — OXYCODONE HCL 5 MG PO TABS: 5.0000 mg | ORAL_TABLET | ORAL | Status: DC | PRN

## 2021-01-02 MED ORDER — POVIDONE-IODINE 10 % EX SWAB
2.0000 "application " | Freq: Once | CUTANEOUS | Status: AC
  Administered 2021-01-02: 2 via TOPICAL

## 2021-01-02 MED ORDER — PROPOFOL 10 MG/ML IV BOLUS
INTRAVENOUS | Status: DC | PRN
  Administered 2021-01-02: 30 mg via INTRAVENOUS
  Administered 2021-01-02: 20 mg via INTRAVENOUS

## 2021-01-02 MED ORDER — ACETAMINOPHEN 325 MG PO TABS: 325.0000 mg | ORAL_TABLET | Freq: Four times a day (QID) | ORAL | Status: DC | PRN

## 2021-01-02 MED ORDER — MIDAZOLAM HCL 2 MG/2ML IJ SOLN
INTRAMUSCULAR | Status: AC
  Filled 2021-01-02: qty 2

## 2021-01-02 MED ORDER — CHLORHEXIDINE GLUCONATE CLOTH 2 % EX PADS: 6.0000 | MEDICATED_PAD | Freq: Every day | CUTANEOUS | Status: DC

## 2021-01-02 MED ORDER — LACTATED RINGERS IV SOLN: INTRAVENOUS | Status: DC

## 2021-01-02 MED ORDER — ACETAMINOPHEN 500 MG PO TABS
1000.0000 mg | ORAL_TABLET | Freq: Four times a day (QID) | ORAL | Status: DC | PRN
  Administered 2021-01-02 – 2021-01-03 (×3): 1000 mg via ORAL
  Filled 2021-01-02 (×2): qty 2

## 2021-01-02 MED ORDER — ONDANSETRON HCL 4 MG/2ML IJ SOLN
4.0000 mg | Freq: Four times a day (QID) | INTRAMUSCULAR | Status: DC | PRN
  Administered 2021-01-02: 4 mg via INTRAVENOUS
  Filled 2021-01-02: qty 2

## 2021-01-02 MED ORDER — MIDAZOLAM HCL 2 MG/2ML IJ SOLN
1.0000 mg | INTRAMUSCULAR | Status: DC
Start: 2021-01-02 — End: 2021-01-02
  Filled 2021-01-02: qty 2

## 2021-01-02 MED ORDER — PHENYLEPHRINE HCL-NACL 20-0.9 MG/250ML-% IV SOLN
INTRAVENOUS | Status: DC | PRN
  Administered 2021-01-02: 25 ug/min via INTRAVENOUS

## 2021-01-02 MED ORDER — TRANEXAMIC ACID-NACL 1000-0.7 MG/100ML-% IV SOLN
1000.0000 mg | INTRAVENOUS | Status: AC
  Administered 2021-01-02: 1000 mg via INTRAVENOUS
  Filled 2021-01-02 (×2): qty 100

## 2021-01-02 MED ORDER — FLUTICASONE PROPIONATE 50 MCG/ACT NA SUSP
2.0000 | Freq: Every day | NASAL | Status: DC
  Administered 2021-01-03: 2 via NASAL
  Filled 2021-01-02: qty 16

## 2021-01-02 MED ORDER — SODIUM CHLORIDE 0.9 % IV SOLN: INTRAVENOUS | Status: DC

## 2021-01-02 MED ORDER — MENTHOL 3 MG MT LOZG: 1.0000 | LOZENGE | OROMUCOSAL | Status: DC | PRN

## 2021-01-02 MED ORDER — FENTANYL CITRATE (PF) 100 MCG/2ML IJ SOLN
50.0000 ug | INTRAMUSCULAR | Status: DC
  Filled 2021-01-02: qty 2

## 2021-01-02 MED ORDER — METOCLOPRAMIDE HCL 5 MG PO TABS
5.0000 mg | ORAL_TABLET | Freq: Three times a day (TID) | ORAL | Status: DC | PRN
  Filled 2021-01-02: qty 2

## 2021-01-02 MED ORDER — PANTOPRAZOLE SODIUM 40 MG PO TBEC
40.0000 mg | DELAYED_RELEASE_TABLET | Freq: Every day | ORAL | Status: DC
  Administered 2021-01-03: 40 mg via ORAL
  Filled 2021-01-02: qty 1

## 2021-01-02 MED ORDER — VITAMIN B-12 1000 MCG PO TABS
1000.0000 ug | ORAL_TABLET | Freq: Every day | ORAL | Status: DC
  Administered 2021-01-03: 1000 ug via ORAL
  Filled 2021-01-02: qty 1

## 2021-01-02 MED ORDER — HYDROMORPHONE HCL 1 MG/ML IJ SOLN: 0.5000 mg | INTRAMUSCULAR | Status: DC | PRN

## 2021-01-02 MED ORDER — 0.9 % SODIUM CHLORIDE (POUR BTL) OPTIME
TOPICAL | Status: DC | PRN
  Administered 2021-01-02: 1000 mL

## 2021-01-02 MED ORDER — CHLORHEXIDINE GLUCONATE 0.12 % MT SOLN
15.0000 mL | Freq: Once | OROMUCOSAL | Status: AC
  Administered 2021-01-02: 15 mL via OROMUCOSAL

## 2021-01-02 MED ORDER — OXYCODONE HCL 5 MG/5ML PO SOLN: 5.0000 mg | Freq: Once | ORAL | Status: DC | PRN

## 2021-01-02 MED ORDER — EPHEDRINE 5 MG/ML INJ
INTRAVENOUS | Status: AC
  Filled 2021-01-02: qty 10

## 2021-01-02 MED ORDER — METHOCARBAMOL 500 MG IVPB - SIMPLE MED
500.0000 mg | Freq: Four times a day (QID) | INTRAVENOUS | Status: DC | PRN
  Filled 2021-01-02: qty 50

## 2021-01-02 MED ORDER — ASPIRIN 81 MG PO CHEW
81.0000 mg | CHEWABLE_TABLET | Freq: Two times a day (BID) | ORAL | Status: DC
  Administered 2021-01-02 – 2021-01-03 (×2): 81 mg via ORAL
  Filled 2021-01-02 (×2): qty 1

## 2021-01-02 MED ORDER — PROPOFOL 500 MG/50ML IV EMUL
INTRAVENOUS | Status: DC | PRN
  Administered 2021-01-02: 40 ug/kg/min via INTRAVENOUS

## 2021-01-02 MED ORDER — PHENYLEPHRINE HCL (PRESSORS) 10 MG/ML IV SOLN
INTRAVENOUS | Status: AC
  Filled 2021-01-02: qty 2

## 2021-01-02 MED ORDER — VITAMIN D 25 MCG (1000 UNIT) PO TABS
1000.0000 [IU] | ORAL_TABLET | Freq: Every day | ORAL | Status: DC
  Administered 2021-01-03: 1000 [IU] via ORAL
  Filled 2021-01-02: qty 1

## 2021-01-02 MED ORDER — OXYCODONE HCL 5 MG PO TABS: 10.0000 mg | ORAL_TABLET | ORAL | Status: DC | PRN

## 2021-01-02 MED ORDER — ONDANSETRON HCL 4 MG/2ML IJ SOLN
INTRAMUSCULAR | Status: DC | PRN
  Administered 2021-01-02: 4 mg via INTRAVENOUS

## 2021-01-02 MED ORDER — VANCOMYCIN HCL IN DEXTROSE 1-5 GM/200ML-% IV SOLN
1000.0000 mg | INTRAVENOUS | Status: DC
  Filled 2021-01-02: qty 200

## 2021-01-02 MED ORDER — FENTANYL CITRATE (PF) 100 MCG/2ML IJ SOLN
INTRAMUSCULAR | Status: AC
  Filled 2021-01-02: qty 2

## 2021-01-02 MED ORDER — HYDROMORPHONE HCL 1 MG/ML IJ SOLN: 0.2500 mg | INTRAMUSCULAR | Status: DC | PRN

## 2021-01-02 MED ORDER — ACETAMINOPHEN 500 MG PO TABS
1000.0000 mg | ORAL_TABLET | Freq: Once | ORAL | Status: AC
  Administered 2021-01-02: 1000 mg via ORAL
  Filled 2021-01-02: qty 2

## 2021-01-02 MED ORDER — DEXAMETHASONE SODIUM PHOSPHATE 10 MG/ML IJ SOLN
INTRAMUSCULAR | Status: AC
  Filled 2021-01-02: qty 2

## 2021-01-02 MED ORDER — HYDROCHLOROTHIAZIDE 25 MG PO TABS
25.0000 mg | ORAL_TABLET | Freq: Every day | ORAL | Status: DC
  Administered 2021-01-03: 25 mg via ORAL
  Filled 2021-01-02: qty 1

## 2021-01-02 MED ORDER — CEFAZOLIN SODIUM-DEXTROSE 2-4 GM/100ML-% IV SOLN
INTRAVENOUS | Status: AC
  Filled 2021-01-02: qty 100

## 2021-01-02 MED ORDER — DOCUSATE SODIUM 100 MG PO CAPS
100.0000 mg | ORAL_CAPSULE | Freq: Two times a day (BID) | ORAL | Status: DC
  Administered 2021-01-02 – 2021-01-03 (×2): 100 mg via ORAL
  Filled 2021-01-02 (×2): qty 1

## 2021-01-02 MED ORDER — ATORVASTATIN CALCIUM 10 MG PO TABS
10.0000 mg | ORAL_TABLET | Freq: Every evening | ORAL | Status: DC
  Administered 2021-01-02: 10 mg via ORAL
  Filled 2021-01-02: qty 1

## 2021-01-02 MED ORDER — AMLODIPINE BESYLATE 5 MG PO TABS
5.0000 mg | ORAL_TABLET | Freq: Every day | ORAL | Status: DC
  Administered 2021-01-03: 5 mg via ORAL
  Filled 2021-01-02: qty 1

## 2021-01-02 MED ORDER — SODIUM CHLORIDE 0.9 % IR SOLN
Status: DC | PRN
  Administered 2021-01-02: 1000 mL

## 2021-01-02 MED ORDER — BUPIVACAINE IN DEXTROSE 0.75-8.25 % IT SOLN
INTRATHECAL | Status: DC | PRN
  Administered 2021-01-02: 1.7 mL via INTRATHECAL

## 2021-01-02 MED ORDER — MIDAZOLAM HCL 5 MG/5ML IJ SOLN
INTRAMUSCULAR | Status: DC | PRN
  Administered 2021-01-02: 2 mg via INTRAVENOUS

## 2021-01-02 SURGICAL SUPPLY — 48 items
APL SKNCLS STERI-STRIP NONHPOA (GAUZE/BANDAGES/DRESSINGS) ×1
BAG SPEC THK2 15X12 ZIP CLS (MISCELLANEOUS)
BAG ZIPLOCK 12X15 (MISCELLANEOUS) IMPLANT
BENZOIN TINCTURE PRP APPL 2/3 (GAUZE/BANDAGES/DRESSINGS) ×2 IMPLANT
BLADE SAW SGTL 18X1.27X75 (BLADE) ×2 IMPLANT
BLADE SAW SGTL 18X1.27X75MM (BLADE) ×1
CLOSURE WOUND 1/2 X4 (GAUZE/BANDAGES/DRESSINGS) ×1
COVER PERINEAL POST (MISCELLANEOUS) ×3 IMPLANT
COVER SURGICAL LIGHT HANDLE (MISCELLANEOUS) ×3 IMPLANT
COVER WAND RF STERILE (DRAPES) ×3 IMPLANT
DRAPE FOOT SWITCH (DRAPES) ×3 IMPLANT
DRAPE STERI IOBAN 125X83 (DRAPES) ×3 IMPLANT
DRAPE U-SHAPE 47X51 STRL (DRAPES) ×6 IMPLANT
DRESSING AQUACEL AG SP 3.5X10 (GAUZE/BANDAGES/DRESSINGS) IMPLANT
DRSG AQUACEL AG ADV 3.5X10 (GAUZE/BANDAGES/DRESSINGS) ×3 IMPLANT
DRSG AQUACEL AG SP 3.5X10 (GAUZE/BANDAGES/DRESSINGS) ×3
DURAPREP 26ML APPLICATOR (WOUND CARE) ×3 IMPLANT
ELECT REM PT RETURN 15FT ADLT (MISCELLANEOUS) ×3 IMPLANT
GAUZE XEROFORM 1X8 LF (GAUZE/BANDAGES/DRESSINGS) ×3 IMPLANT
GLOVE SRG 8 PF TXTR STRL LF DI (GLOVE) ×2 IMPLANT
GLOVE SURG ENC MOIS LTX SZ7 (GLOVE) ×2 IMPLANT
GLOVE SURG ENC MOIS LTX SZ7.5 (GLOVE) ×3 IMPLANT
GLOVE SURG LTX SZ8 (GLOVE) ×3 IMPLANT
GLOVE SURG POLYISO LF SZ7 (GLOVE) ×6 IMPLANT
GLOVE SURG UNDER POLY LF SZ7 (GLOVE) ×4 IMPLANT
GLOVE SURG UNDER POLY LF SZ8 (GLOVE) ×6
GOWN STRL REUS W/TWL XL LVL3 (GOWN DISPOSABLE) ×6 IMPLANT
HANDPIECE INTERPULSE COAX TIP (DISPOSABLE) ×3
HEAD M SROM 36MM PLUS 1.5 (Hips) IMPLANT
HOLDER FOLEY CATH W/STRAP (MISCELLANEOUS) ×3 IMPLANT
KIT TURNOVER KIT A (KITS) ×3 IMPLANT
LINER ACETAB NEUTRAL 36ID 520D (Liner) ×2 IMPLANT
PACK ANTERIOR HIP CUSTOM (KITS) ×3 IMPLANT
PENCIL SMOKE EVACUATOR (MISCELLANEOUS) ×2 IMPLANT
PIN SECTOR W/GRIP ACE CUP 52MM (Hips) ×2 IMPLANT
SET HNDPC FAN SPRY TIP SCT (DISPOSABLE) ×1 IMPLANT
SROM M HEAD 36MM PLUS 1.5 (Hips) ×3 IMPLANT
STAPLER VISISTAT 35W (STAPLE) ×2 IMPLANT
STEM CORAIL KA12 (Stem) ×2 IMPLANT
STRIP CLOSURE SKIN 1/2X4 (GAUZE/BANDAGES/DRESSINGS) ×1 IMPLANT
SUT ETHIBOND NAB CT1 #1 30IN (SUTURE) ×3 IMPLANT
SUT ETHILON 2 0 PS N (SUTURE) IMPLANT
SUT MNCRL AB 4-0 PS2 18 (SUTURE) ×2 IMPLANT
SUT VIC AB 0 CT1 36 (SUTURE) ×5 IMPLANT
SUT VIC AB 1 CT1 36 (SUTURE) ×5 IMPLANT
SUT VIC AB 2-0 CT1 27 (SUTURE) ×6
SUT VIC AB 2-0 CT1 TAPERPNT 27 (SUTURE) ×2 IMPLANT
TRAY FOLEY MTR SLVR 16FR STAT (SET/KITS/TRAYS/PACK) IMPLANT

## 2021-01-02 NOTE — Brief Op Note (Signed)
01/02/2021  10:42 AM  PATIENT:  Sylvia Davies  60 y.o. female  PRE-OPERATIVE DIAGNOSIS:  Osteoarthritis Left Hip  POST-OPERATIVE DIAGNOSIS:  Osteoarthritis Left Hip  PROCEDURE:  Procedure(s): LEFT TOTAL HIP ARTHROPLASTY ANTERIOR APPROACH (Left)  SURGEON:  Surgeon(s) and Role:    Mcarthur Rossetti, MD - Primary  PHYSICIAN ASSISTANT:  Benita Stabile, PA-C  ANESTHESIA:   spinal  EBL:  250 mL   COUNTS:  YES  DICTATION: .Other Dictation: Dictation Number [id number]  PLAN OF CARE: Admit for overnight observation  PATIENT DISPOSITION:  PACU - hemodynamically stable.   Delay start of Pharmacological VTE agent (>24hrs) due to surgical blood loss or risk of bleeding: no

## 2021-01-02 NOTE — Evaluation (Signed)
Physical Therapy Evaluation Patient Details Name: Sylvia Davies MRN: [id number] DOB: 01-05-1961 Today's Date: 01/02/2021   History of Present Illness  Patient is 60 y.o. female s/p Lt THA anterior approach on 01/02/21 with PMH significant for GERD, HLD, HTN, OA, Rt THA and revision.    Clinical Impression  Sylvia Davies is a 60 y.o. female POD 0 s/p Lt THA. Patient reports independence with mobility at baseline. Patient is now limited by functional impairments (see PT problem list below) and requires min assist for transfers and gait with RW. Patient was able to ambulate ~6 feet with RW and min assist. She was limited today by nausea and bouts of dry heaving; seated rest provided and RN notified. Patient instructed in exercise to facilitate circulation to manage edema and reduce risk of DVT. Patient will benefit from continued skilled PT interventions to address impairments and progress towards PLOF. Acute PT will follow to progress mobility and stair training in preparation for safe discharge home.     Follow Up Recommendations Follow surgeon's recommendation for DC plan and follow-up therapies;Home health PT    Equipment Recommendations  Rolling walker with 5" wheels    Recommendations for Other Services       Precautions / Restrictions Precautions Precautions: Fall Restrictions Weight Bearing Restrictions: No LLE Weight Bearing: Weight bearing as tolerated      Mobility  Bed Mobility Overal bed mobility: Needs Assistance Bed Mobility: Supine to Sit     Supine to sit: Min assist;HOB elevated     General bed mobility comments: cues to use bed rail and assist for Lt LE to move to EOB.    Transfers Overall transfer level: Needs assistance Equipment used: Rolling walker (2 wheeled) Transfers: Sit to/from Stand Sit to Stand: Min assist;Min guard            Ambulation/Gait Ambulation/Gait assistance: Herbalist (Feet): 6 Feet Assistive device:  Rolling walker (2 wheeled) Gait Pattern/deviations: Step-to pattern;Decreased stride length;Decreased weight shift to left Gait velocity: decr   General Gait Details: cues for step pattern and proximity to RW. pt required min assist to manage walker and c/o nausea after short distance in room. seated rest provided and RN notified.  Stairs            Wheelchair Mobility    Modified Rankin (Stroke Patients Only)       Balance Overall balance assessment: Needs assistance Sitting-balance support: Feet supported Sitting balance-Leahy Scale: Good     Standing balance support: During functional activity;Bilateral upper extremity supported Standing balance-Leahy Scale: Poor                               Pertinent Vitals/Pain Pain Assessment: 0-10 Pain Score: 6  Pain Location: Lt hip Pain Descriptors / Indicators: Aching;Discomfort (pt nauseous) Pain Intervention(s): Limited activity within patient's tolerance;Monitored during session;Repositioned;Patient requesting pain meds-RN notified (nausea meds)    Home Living Family/patient expects to be discharged to:: Private residence Living Arrangements: Children Available Help at Discharge: Family Type of Home: House Home Access: Stairs to enter   Technical brewer of Steps: 3 Home Layout: One level Home Equipment: Walker - standard;Grab bars - tub/shower Additional Comments: pt's daughter will help her recover    Prior Function Level of Independence: Independent               Hand Dominance   Dominant Hand: Right    Extremity/Trunk Assessment  Upper Extremity Assessment Upper Extremity Assessment: Overall WFL for tasks assessed    Lower Extremity Assessment Lower Extremity Assessment: Overall WFL for tasks assessed    Cervical / Trunk Assessment Cervical / Trunk Assessment: Normal  Communication   Communication: No difficulties  Cognition Arousal/Alertness: Awake/alert Behavior During  Therapy: WFL for tasks assessed/performed Overall Cognitive Status: Within Functional Limits for tasks assessed                                        General Comments      Exercises Total Joint Exercises Ankle Circles/Pumps: AROM;Both;10 reps;Seated   Assessment/Plan    PT Assessment Patient needs continued PT services  PT Problem List Decreased strength;Decreased range of motion;Decreased activity tolerance;Decreased balance;Decreased mobility;Decreased knowledge of use of DME;Decreased knowledge of precautions;Pain       PT Treatment Interventions DME instruction;Gait training;Stair training;Functional mobility training;Therapeutic activities;Therapeutic exercise;Balance training;Patient/family education    PT Goals (Current goals can be found in the Care Plan section)  Acute Rehab PT Goals Patient Stated Goal: get back home and independent again PT Goal Formulation: With patient Time For Goal Achievement: 01/09/21 Potential to Achieve Goals: Good    Frequency 7X/week   Barriers to discharge        Co-evaluation               AM-PAC PT "6 Clicks" Mobility  Outcome Measure Help needed turning from your back to your side while in a flat bed without using bedrails?: A Little Help needed moving from lying on your back to sitting on the side of a flat bed without using bedrails?: A Little Help needed moving to and from a bed to a chair (including a wheelchair)?: A Little Help needed standing up from a chair using your arms (e.g., wheelchair or bedside chair)?: A Little Help needed to walk in hospital room?: A Little Help needed climbing 3-5 steps with a railing? : A Lot 6 Click Score: 17    End of Session Equipment Utilized During Treatment: Gait belt Activity Tolerance: Treatment limited secondary to medical complications (Comment) (nausea) Patient left: in chair;with call bell/phone within reach;with chair alarm set;with family/visitor  present Nurse Communication: Mobility status;Other (comment) (nausea meds) PT Visit Diagnosis: Muscle weakness (generalized) (M62.81);Difficulty in walking, not elsewhere classified (R26.2)    Time: 0370-4888 PT Time Calculation (min) (ACUTE ONLY): 20 min   Charges:   PT Evaluation $PT Eval Low Complexity: 1 Low          Verner Mould, DPT Acute Rehabilitation Services Office [phone] Pager [phone]   Jacques Navy 01/02/2021, 6:01 PM

## 2021-01-02 NOTE — Anesthesia Postprocedure Evaluation (Signed)
Anesthesia Post Note  Patient: Sylvia Davies  Procedure(s) Performed: LEFT TOTAL HIP ARTHROPLASTY ANTERIOR APPROACH (Left: Hip)     Patient location during evaluation: PACU Anesthesia Type: MAC and Spinal Level of consciousness: awake and alert and oriented Pain management: pain level controlled Vital Signs Assessment: post-procedure vital signs reviewed and stable Respiratory status: spontaneous breathing, nonlabored ventilation and respiratory function stable Cardiovascular status: blood pressure returned to baseline and stable Postop Assessment: no headache, no backache, spinal receding, patient able to bend at knees and no apparent nausea or vomiting Anesthetic complications: no   No notable events documented.  Last Vitals:  Vitals:   01/02/21 1115 01/02/21 1130  BP: 110/67 107/73  Pulse: 75 75  Resp: (!) 22 16  Temp: 36.4 C   SpO2: 95% 96%    Last Pain:  Vitals:   01/02/21 1115  TempSrc:   PainSc: 0-No pain                 Pervis Hocking

## 2021-01-02 NOTE — Anesthesia Preprocedure Evaluation (Addendum)
Anesthesia Evaluation  Patient identified by MRN, date of birth, ID band Patient awake    Reviewed: Allergy & Precautions, NPO status , Patient's Chart, lab work & pertinent test results  Airway Mallampati: III  TM Distance: >3 FB Neck ROM: Full    Dental no notable dental hx. (+) Teeth Intact, Dental Advisory Given   Pulmonary neg pulmonary ROS,    Pulmonary exam normal breath sounds clear to auscultation       Cardiovascular hypertension, Pt. on medications Normal cardiovascular exam Rhythm:Regular Rate:Normal  Echo 05/2019: 1. Left ventricular ejection fraction, by visual estimation, is 65 to  70%. The left ventricle has normal function. There is no left ventricular  hypertrophy.  2. Global right ventricle has normal systolic function.The right  ventricular size is normal. No increase in right ventricular wall  thickness.  3. Left atrial size was normal.  4. Right atrial size was normal.  5. The mitral valve is normal in structure. No evidence of mitral valve  regurgitation. No evidence of mitral stenosis.  6. The tricuspid valve is normal in structure. Tricuspid valve  regurgitation is trivial.  7. The aortic valve is normal in structure. Aortic valve regurgitation is  not visualized. No evidence of aortic valve sclerosis or stenosis.  8. The pulmonic valve was normal in structure. Pulmonic valve  regurgitation is not visualized.  9. The atrial septum is grossly normal.    Neuro/Psych  Headaches, negative psych ROS   GI/Hepatic Neg liver ROS, GERD  Medicated and Controlled,  Endo/Other  Obesity BMI 34  Renal/GU negative Renal ROS  negative genitourinary   Musculoskeletal  (+) Arthritis , Osteoarthritis,  OA L hip   Abdominal   Peds  Hematology negative hematology ROS (+) hct 41, plt 265   Anesthesia Other Findings   Reproductive/Obstetrics negative OB ROS                            Anesthesia Physical Anesthesia Plan  ASA: 2  Anesthesia Plan: Spinal and MAC   Post-op Pain Management:    Induction:   PONV Risk Score and Plan: 2 and Propofol infusion and TIVA  Airway Management Planned: Natural Airway and Nasal Cannula  Additional Equipment: None  Intra-op Plan:   Post-operative Plan:   Informed Consent: I have reviewed the patients History and Physical, chart, labs and discussed the procedure including the risks, benefits and alternatives for the proposed anesthesia with the patient or authorized representative who has indicated his/her understanding and acceptance.       Plan Discussed with: CRNA  Anesthesia Plan Comments: (Other hip done in 2019 under spinal )        Anesthesia Quick Evaluation

## 2021-01-02 NOTE — Interval H&P Note (Signed)
History and Physical Interval Note:   The patient understands that she is here today for a left total hip replacement to treat her left hip osteoarthritis.  There has been no interval or acute change in her medical status.  Please see recent H&P.  The risks and benefits of surgery have been explained in detail and informed consent is obtained.  The left hip has been marked.  01/02/2021 8:01 AM  Sylvia Davies  has presented today for surgery, with the diagnosis of Osteoarthritis Left Hip.  The various methods of treatment have been discussed with the patient and family. After consideration of risks, benefits and other options for treatment, the patient has consented to  Procedure(s): LEFT TOTAL HIP ARTHROPLASTY ANTERIOR APPROACH (Left) as a surgical intervention.  The patient's history has been reviewed, patient examined, no change in status, stable for surgery.  I have reviewed the patient's chart and labs.  Questions were answered to the patient's satisfaction.     Mcarthur Rossetti

## 2021-01-02 NOTE — Anesthesia Procedure Notes (Signed)
Spinal  Start time: 01/02/2021 9:11 AM End time: 01/02/2021 9:16 AM Reason for block: surgical anesthesia Staffing Performed: resident/CRNA  Resident/CRNA: Victoriano Lain, CRNA Preanesthetic Checklist Completed: patient identified, IV checked, site marked, risks and benefits discussed, surgical consent, monitors and equipment checked, pre-op evaluation and timeout performed Spinal Block Patient position: sitting Prep: DuraPrep and site prepped and draped Patient monitoring: heart rate, continuous pulse ox and blood pressure Approach: midline Location: L3-4 Injection technique: single-shot Needle Needle type: Pencan  Needle gauge: 24 G Needle length: 10 cm Assessment Sensory level: T4 Events: CSF return Additional Notes Pt placed in sitting position for spinal placement. Spinal kit expiration date checked and verified. Sterile prep and drape of back. One attempt by CRNA. + clear CSF, - heme. Pt tolerated well.

## 2021-01-02 NOTE — Transfer of Care (Signed)
Immediate Anesthesia Transfer of Care Note  Patient: Sylvia Davies  Procedure(s) Performed: LEFT TOTAL HIP ARTHROPLASTY ANTERIOR APPROACH (Left: Hip)  Patient Location: PACU  Anesthesia Type:MAC and Spinal  Level of Consciousness: awake, alert , oriented and patient cooperative  Airway & Oxygen Therapy: Patient Spontanous Breathing and Patient connected to face mask oxygen  Post-op Assessment: Report given to RN and Post -op Vital signs reviewed and stable  Post vital signs: Reviewed and stable  Last Vitals:  Vitals Value Taken Time  BP 90/58 01/02/21 1101  Temp    Pulse 70 01/02/21 1105  Resp 20 01/02/21 1105  SpO2 100 % 01/02/21 1105  Vitals shown include unvalidated device data.  Last Pain:  Vitals:   01/02/21 0645  TempSrc: Oral         Complications: No notable events documented.

## 2021-01-02 NOTE — Op Note (Signed)
NAME: Sylvia Davies, Sylvia B. MEDICAL RECORD NO: [id number] ACCOUNT NO: [id number] DATE OF BIRTH: 1960-10-01 FACILITY: [facility] LOCATION: [id number] PHYSICIAN: Lind Guest. Ninfa Linden, MD  Operative Report   DATE OF PROCEDURE: 01/02/2021  PREOPERATIVE DIAGNOSIS:  Primary osteoarthritis and degenerative joint disease, left hip.  POSTOPERATIVE DIAGNOSIS:  Primary osteoarthritis and degenerative joint disease, left hip.  PROCEDURE:  Left total hip arthroplasty through direct anterior approach.  IMPLANTS:  DePuy sector Gription acetabular component size 52, size 36+0 neutral polyethylene liner, size 12 Corail femoral component with standard offset, size 36+1.5 metal hip ball.  SURGEON:  Lind Guest. Ninfa Linden, MD  ASSISTANT:  Benita Stabile, PA-C  ANESTHESIA:  Spinal.  ANTIBIOTICS:  2 g IV Ancef.  ESTIMATED BLOOD LOSS:  250 mL.  COMPLICATIONS:  None.  INDICATIONS:  The patient is a 60 year old female, well known to me.  She has debilitating arthritis involving her left hip.  This has been well documented with clinical exam and x-rays.  We actually replaced her right hip for similar reasons back in  2019 and that hip has done very well.  At this point, her left[TIME: 00:57] hip pain has become daily, and it is detrimentally affecting her mobility, her quality of life, and activities of daily living to the point she does express a desire to proceed  with a left total hip arthroplasty and I agree with this as well.  She is morbidly obese.  She understands there is a high risk of acute blood loss anemia, nerve or vessel injury, fracture, infection, DVT, implant failure, dislocation, and skin and soft  tissue issues.  She understands our goals are to decrease pain, improve mobility, and overall improve quality of life.  DESCRIPTION OF PROCEDURE:  After informed consent was obtained, appropriate left hip was marked.  She was brought to the operating room and sat up on a stretcher where spinal anesthesia was  then obtained.  She was laid in supine position on the  stretcher.  Traction boots were placed on both her feet.  A Foley catheter was placed.  She has a significant leg length discrepancy with her left side shorter than the right.  She was placed supine on the [facility] operating table, the perineal post in place  and both legs in line skeletal traction devices, no traction applied.  Her left operative hip was then prepped and draped with DuraPrep and sterile drapes.  A timeout was called. She was identified as correct patient, correct left hip.  I then made an  incision just inferior and posterior to the anterior superior iliac spine and carried this obliquely down the leg.  We dissected down tensor fascia lata muscle.  Tensor fascia was then divided longitudinally to proceed with direct anterior approach to  the hip.  We identified and cauterized circumflex vessels, identified the hip capsule, opened the hip capsule in L-type format finding a moderate joint effusion and significant periarticular osteophytes around the lateral femoral head and neck.  I placed  Cobra retractors in the medial and lateral femoral neck and made a femoral neck cut with the oscillating saw and completed this with an osteotome.  This was proximal to the lesser trochanter.  A corkscrew was placed in the femoral head and removed the  femoral head its entirety and there was a wide area devoid of cartilage.  I then placed a bent Hohmann over the medial acetabular rim and removed remnants of the acetabular labrum and other debris.  We then began reaming under direct visualization from  a  size 43 reamer in a stepwise increments went up to a size 51 reamer with all reamers placed under direct visualization, the last reamer was also placed under direct fluoroscopy, so I could obtain my depth of reaming, my inclination and anteversion.  I  then placed a real DePuy sector Gription acetabular component size 52 and a 36+0 neutral polyethylene  liner based on her offset and experience with her other side.  Attention was then turned to the femur.  With the leg externally rotated to 120 degrees,  extended and adducted, we were able to place a Mueller retractor medially and Hohman retractor behind the greater trochanter. We released lateral joint capsule and used a box cutting osteotome to enter the femoral canal and a rongeur to lateralize, then  began broaching using the Corail broaching system from a size 8 going up to a size 12.  With size 12 in place, we trialed a standard offset femoral neck and a 36+1.5 hip ball and reduced this in acetabulum.  We were pleased with increasing her leg  length.  We were also pleased with her offset, range of motion, and stability, assessed mechanically and radiographically.  We then dislocated the hip and removed the trial components.  We placed the real Corail femoral component size 12 with standard  offset and real 36+1.5 metal hip ball and again reduced this into the acetabulum and it was stable.  We then irrigated the soft tissue with normal saline solution using pulsatile lavage.  We closed the joint capsule with interrupted #1 Ethibond suture  followed by #1 Vicryl to close the tensor fascia.  0 Vicryl was used to close the deep tissue and 2-0 Vicryl was used to close the subcutaneous tissue.  The skin was closed with staples.  Aquacel dressing was applied.  She was taken off of the Hana table  and taken to recovery room in stable condition with all final counts being correct and no complications noted.   Of note, Benita Stabile, PA-C, did assist during the entire case and assistance was crucial for facilitating all aspects of this case.   [facility] D: 01/02/2021 10:40:33 am T: 01/02/2021 3:26:00 pm  JOB: [id number]/ [id number]

## 2021-01-03 DIAGNOSIS — M1612 Unilateral primary osteoarthritis, left hip: Secondary | ICD-10-CM | POA: Diagnosis not present

## 2021-01-03 LAB — CBC
HCT: 37 % (ref 36.0–46.0)
Hemoglobin: 12.3 g/dL (ref 12.0–15.0)
MCH: 27.5 pg (ref 26.0–34.0)
MCHC: 33.2 g/dL (ref 30.0–36.0)
MCV: 82.6 fL (ref 80.0–100.0)
Platelets: 228 10*3/uL (ref 150–400)
RBC: 4.48 MIL/uL (ref 3.87–5.11)
RDW: 14.9 % (ref 11.5–15.5)
WBC: 13.9 10*3/uL — ABNORMAL HIGH (ref 4.0–10.5)
nRBC: 0 % (ref 0.0–0.2)

## 2021-01-03 LAB — BASIC METABOLIC PANEL
Anion gap: 7 (ref 5–15)
BUN: 11 mg/dL (ref 8–23)
CO2: 24 mmol/L (ref 22–32)
Calcium: 9.3 mg/dL (ref 8.9–10.3)
Chloride: 107 mmol/L (ref 98–111)
Creatinine, Ser: 0.6 mg/dL (ref 0.44–1.00)
GFR, Estimated: >60 mL/min (ref >60)
Glucose, Bld: 119 mg/dL — ABNORMAL HIGH (ref 70–99)
Potassium: 4 mmol/L (ref 3.5–5.1)
Sodium: 138 mmol/L (ref 135–145)

## 2021-01-03 MED ORDER — METHOCARBAMOL 500 MG PO TABS: 500.0000 mg | ORAL_TABLET | Freq: Four times a day (QID) | ORAL | 1 refills | Status: DC | PRN

## 2021-01-03 MED ORDER — ASPIRIN 81 MG PO CHEW
81.0000 mg | CHEWABLE_TABLET | Freq: Two times a day (BID) | ORAL | 0 refills | Status: DC
Start: 2021-01-03 — End: 2023-02-23

## 2021-01-03 NOTE — Progress Notes (Signed)
The patient is alert and oriented and has been seen by her physician. The orders for discharge were written. IV has been removed. Went over discharge instructions with patient. She is about to be discharged via wheelchair with all of her belongings.

## 2021-01-03 NOTE — Progress Notes (Signed)
Subjective: 1 Day Post-Op Procedure(s) (LRB): LEFT TOTAL HIP ARTHROPLASTY ANTERIOR APPROACH (Left) Patient reports pain as moderate.  Working well with therapy.  Vitals and labs stable.  Objective: Vital signs in last 24 hours: Temp:  [97.6 F (36.4 C)-98.3 F (36.8 C)] 98.3 F (36.8 C) (06/25 0946) Pulse Rate:  [60-80] 77 (06/25 0946) Resp:  [15-22] 18 (06/25 0946) BP: (90-168)/(58-83) 168/75 (06/25 0946) SpO2:  [95 %-100 %] 100 % (06/25 0946)  Intake/Output from previous day: 06/24 0701 - 06/25 0700 In: 3914 [P.O.:1420; I.V.:2294; IV Piggyback:200] Out: 6734 [Urine:4900; Blood:250] Intake/Output this shift: Total I/O In: 321.5 [P.O.:240; I.V.:81.5] Out: -   Recent Labs    01/03/21 0350  HGB 12.3   Recent Labs    01/03/21 0350  WBC 13.9*  RBC 4.48  HCT 37.0  PLT 228   Recent Labs    01/03/21 0350  NA 138  K 4.0  CL 107  CO2 24  BUN 11  CREATININE 0.60  GLUCOSE 119*  CALCIUM 9.3   No results for input(s): LABPT, INR in the last 72 hours.  Sensation intact distally Intact pulses distally Dorsiflexion/Plantar flexion intact Incision: dressing C/D/I   Assessment/Plan: 1 Day Post-Op Procedure(s) (LRB): LEFT TOTAL HIP ARTHROPLASTY ANTERIOR APPROACH (Left) Up with therapy Discharge home with home health      Mcarthur Rossetti 01/03/2021, 10:53 AM

## 2021-01-03 NOTE — Plan of Care (Signed)
  Problem: Education: Goal: Knowledge of General Education information will improve Description: Including pain rating scale, medication(s)/side effects and non-pharmacologic comfort measures Outcome: Progressing   Problem: Pain Managment: Goal: General experience of comfort will improve Outcome: Progressing   

## 2021-01-03 NOTE — Discharge Summary (Signed)
Patient ID: Sylvia Davies MRN: [id number] DOB/AGE: 1961/03/10 60 y.o.  Admit date: 01/02/2021 Discharge date: 01/03/2021  Admission Diagnoses:  Principal Problem:   Unilateral primary osteoarthritis, left hip Active Problems:   Status post left hip replacement   Discharge Diagnoses:  Same  Past Medical History:  Diagnosis Date   ALLERGIC RHINITIS 04/02/2007   Allergy    Arthritis    BREAST BIOPSY, HX OF 04/02/2007   Diverticulosis    FIBROCYSTIC BREAST DISEASE, HX OF 04/02/2007   GERD (gastroesophageal reflux disease)    Heart murmur    Hemorrhoids    HYPERLIPIDEMIA 04/02/2007   HYPERTENSION, BENIGN ESSENTIAL 05/04/2007   Impaired glucose tolerance 11/08/2012   Irritable bowel syndrome 04/02/2007   MITRAL REGURGITATION, 0 (MILD) 04/02/2007   OBESITY 04/02/2007    Surgeries: Procedure(s): LEFT TOTAL HIP ARTHROPLASTY ANTERIOR APPROACH on 01/02/2021   Consultants:   Discharged Condition: Improved  Hospital Course: Sylvia Davies is an 60 y.o. female who was admitted 01/02/2021 for operative treatment ofUnilateral primary osteoarthritis, left hip. Patient has severe unremitting pain that affects sleep, daily activities, and work/hobbies. After pre-op clearance the patient was taken to the operating room on 01/02/2021 and underwent  Procedure(s): LEFT TOTAL HIP ARTHROPLASTY ANTERIOR APPROACH.    Patient was given perioperative antibiotics:  Anti-infectives (From admission, onward)    Start     Dose/Rate Route Frequency Ordered Stop   01/02/21 2200  vancomycin (VANCOCIN) IVPB 1000 mg/200 mL premix        1,000 mg 200 mL/hr over 60 Minutes Intravenous Every 12 hours 01/02/21 1055 01/02/21 2300   01/02/21 0800  ceFAZolin (ANCEF) IVPB 2g/100 mL premix        2 g 200 mL/hr over 30 Minutes Intravenous  Once 01/02/21 0757 01/02/21 0947   01/02/21 0755  ceFAZolin (ANCEF) 2-4 GM/100ML-% IVPB       Note to Pharmacy: Sylvia Davies  : cabinet override      01/02/21  0755 01/02/21 0921   01/02/21 0630  vancomycin (VANCOCIN) IVPB 1000 mg/200 mL premix  Status:  Discontinued        1,000 mg 200 mL/hr over 60 Minutes Intravenous On call to O.R. 01/02/21 4098 01/02/21 0757        Patient was given sequential compression devices, early ambulation, and chemoprophylaxis to prevent DVT.  Patient benefited maximally from hospital stay and there were no complications.    Recent vital signs: Patient Vitals for the past 24 hrs:  BP Temp Temp src Pulse Resp SpO2  01/03/21 0946 (!) 168/75 98.3 F (36.8 C) Oral 77 18 100 %  01/03/21 0558 (!) 157/83 98 F (36.7 C) Oral 75 15 96 %  01/03/21 0148 135/73 97.9 F (36.6 C) Oral 60 16 96 %  01/02/21 2155 132/74 98 F (36.7 C) Oral 75 16 97 %  01/02/21 1512 132/75 97.8 F (36.6 C) Oral 80 16 97 %  01/02/21 1306 126/79 97.9 F (36.6 C) Oral 68 16 96 %  01/02/21 1230 111/71 97.6 F (36.4 C) -- 72 20 96 %  01/02/21 1215 120/75 -- -- 79 (!) 21 96 %  01/02/21 1200 114/72 -- -- 67 17 95 %  01/02/21 1145 106/67 -- -- 65 15 96 %  01/02/21 1130 107/73 -- -- 75 16 96 %  01/02/21 1115 110/67 97.6 F (36.4 C) -- 75 (!) 22 95 %  01/02/21 1100 (!) 90/58 -- -- -- -- --     Recent laboratory studies:  Recent Labs  01/03/21 0350  WBC 13.9*  HGB 12.3  HCT 37.0  PLT 228  NA 138  K 4.0  CL 107  CO2 24  BUN 11  CREATININE 0.60  GLUCOSE 119*  CALCIUM 9.3     Discharge Medications:   Allergies as of 01/03/2021       Reactions   Tramadol Hives   Amlodipine Besy-benazepril Hcl Itching   REACTION:sore throat   Penicillins    Has patient had a PCN reaction causing immediate rash, facial/tongue/throat swelling, SOB or lightheadedness with hypotension: no Has patient had a PCN reaction causing severe rash involving mucus membranes or skin necrosis: no Has patient had a PCN reaction that required hospitalization no Has patient had a PCN reaction occurring within the last 10 years: yes If all of the above answers  are "NO", then may proceed with Cephalosporin use.   Cleocin [clindamycin Hcl] Itching   Hyoscyamine Other (See Comments)   Intolerance   Zylet [loteprednol-tobramycin] Itching        Medication List     STOP taking these medications    aspirin 81 MG EC tablet Replaced by: aspirin 81 MG chewable tablet       TAKE these medications    acetaminophen 650 MG CR tablet Commonly known as: TYLENOL Take 1,300 mg by mouth every 8 (eight) hours as needed for pain.   amLODipine 5 MG tablet Commonly known as: NORVASC Take 1 tablet (5 mg total) by mouth daily.   aspirin 81 MG chewable tablet Chew 1 tablet (81 mg total) by mouth 2 (two) times daily. Replaces: aspirin 81 MG EC tablet   cholecalciferol 25 MCG (1000 UNIT) tablet Commonly known as: VITAMIN D3 Take 1,000 Units by mouth daily.   hydrochlorothiazide 25 MG tablet Commonly known as: HYDRODIURIL Take 1 tablet (25 mg total) by mouth daily.   methocarbamol 500 MG tablet Commonly known as: ROBAXIN Take 1 tablet (500 mg total) by mouth every 6 (six) hours as needed for muscle spasms.   MULTIVITAMIN PO Take 1 tablet by mouth daily.   vitamin B-12 1000 MCG tablet Commonly known as: CYANOCOBALAMIN Take 1,000 mcg by mouth daily.       ASK your doctor about these medications    atorvastatin 10 MG tablet Commonly known as: LIPITOR TAKE 1 TABLET BY MOUTH EVERY DAY AT 6PM   cetirizine 10 MG tablet Commonly known as: ZYRTEC Take 1 tablet (10 mg total) by mouth at bedtime.   fluticasone 50 MCG/ACT nasal spray Commonly known as: FLONASE SPRAY 2 SPRAYS INTO EACH NOSTRIL EVERY DAY               Durable Medical Equipment  (From admission, onward)           Start     Ordered   01/02/21 1305  DME 3 n 1  Once        01/02/21 1304   01/02/21 1305  DME Walker rolling  Once       Question Answer Comment  Walker: With 5 Inch Wheels   Patient needs a walker to treat with the following condition Status post  left hip replacement      01/02/21 1304            Diagnostic Studies: DG Pelvis Portable  Result Date: 01/02/2021 CLINICAL DATA:  Postop LEFT-sided anterior approach hip replacement. History of osteoarthritis. EXAM: PORTABLE PELVIS 1-2 VIEWS COMPARISON:  January 02, 2021 intraoperative images. FINDINGS: AP pelvis provided, two views. Gas about  the LEFT hip with skin staples in place overlying the soft tissues of the LEFT hip. LEFT hip appears located on AP view. Postoperative changes of LEFT hip arthroplasty are noted. No apparent complication. IMPRESSION: Postoperative changes of LEFT hip arthroplasty without complicating features. Signs of RIGHT hip arthroplasty as on previous imaging. Electronically Signed   By: Zetta Bills M.D.   On: 01/02/2021 13:29   DG C-Arm 1-60 Min-No Report  Result Date: 01/02/2021 Fluoroscopy was utilized by the requesting physician.  No radiographic interpretation.   MM 3D SCREEN BREAST BILATERAL  Result Date: 12/26/2020 CLINICAL DATA:  Screening. EXAM: DIGITAL SCREENING BILATERAL MAMMOGRAM WITH TOMOSYNTHESIS AND CAD TECHNIQUE: Bilateral screening digital craniocaudal and mediolateral oblique mammograms were obtained. Bilateral screening digital breast tomosynthesis was performed. The images were evaluated with computer-aided detection. COMPARISON:  Previous exam(s). ACR Breast Density Category b: There are scattered areas of fibroglandular density. FINDINGS: There are no findings suspicious for malignancy. IMPRESSION: No mammographic evidence of malignancy. A result letter of this screening mammogram will be mailed directly to the patient. RECOMMENDATION: Screening mammogram in one year. (Code:SM-B-01Y) BI-RADS CATEGORY  1: Negative. Electronically Signed   By: Audie Pinto M.D.   On: 12/26/2020 08:14   DG HIP OPERATIVE UNILAT W OR W/O PELVIS LEFT  Result Date: 01/02/2021 CLINICAL DATA:  Left total hip replacement. EXAM: OPERATIVE LEFT HIP (WITH PELVIS IF  PERFORMED) 7 VIEWS TECHNIQUE: Fluoroscopic spot image(s) were submitted for interpretation post-operatively. COMPARISON:  None. FINDINGS: Fluoro time: 19 seconds. Port radiation dose: 2.33 mGy. Seven C-arm fluoroscopic images are provided for post operative interpretation. These images demonstrate changes of left total hip arthroplasty and partially imaged prior right total hip arthroplasty. No unexpected findings. Please see the performing provider's procedural report for further detail. IMPRESSION: Left total hip arthroplasty. Electronically Signed   By: Margaretha Sheffield MD   On: 01/02/2021 12:30    Disposition: Discharge disposition: 01-Home or [place]     Mcarthur Rossetti, MD Follow up in 2 week(s).   Specialty: Orthopedic Surgery Contact information: [address] [place] [address] [phone]                  Signed: Mcarthur Rossetti 01/03/2021, 10:55 AM

## 2021-01-03 NOTE — Progress Notes (Signed)
Physical Therapy Treatment Patient Details Name: Sylvia Davies MRN: [id number] DOB: 11-03-1960 Today's Date: 01/03/2021    History of Present Illness Patient is 60 y.o. female s/p Lt THA anterior approach on 01/02/21 with PMH significant for GERD, HLD, HTN, OA, Rt THA and revision.    PT Comments    Pt complaining of very mild post-operative pain during mobility today. Pt ambulatory for >100 ft with RW today, no complaints of dizziness or nausea during mobility. Pt requiring light assist for transfers at this time, but improving. PT to see pt for second session to practice stair navigation, gait training, and THA exercises. Will continue to follow.    Follow Up Recommendations  Follow surgeon's recommendation for DC plan and follow-up therapies;Home health PT     Equipment Recommendations  Rolling walker with 5" wheels    Recommendations for Other Services       Precautions / Restrictions Precautions Precautions: Fall Restrictions Weight Bearing Restrictions: No LLE Weight Bearing: Weight bearing as tolerated    Mobility  Bed Mobility Overal bed mobility: Needs Assistance Bed Mobility: Supine to Sit     Supine to sit: Min assist;HOB elevated     General bed mobility comments: min assist for LLE translation to EOB, increased time and effort to scoot self to EOB.    Transfers Overall transfer level: Needs assistance Equipment used: Rolling walker (2 wheeled) Transfers: Sit to/from Stand Sit to Stand: Min assist         General transfer comment: min assist for power up, rise, and steady. STS x2, from EOB and BSC. VC for hand placement when rising/sitting.  Ambulation/Gait Ambulation/Gait assistance: Min guard Gait Distance (Feet): 130 Feet Assistive device: Rolling walker (2 wheeled) Gait Pattern/deviations: Step-through pattern;Decreased stride length;Trunk flexed;Antalgic Gait velocity: decr   General Gait Details: min guard for safety, verbal cuing for  upright posture, placement in RW, sequencing with LLE leading to offweight operative LE as needed.   Stairs             Wheelchair Mobility    Modified Rankin (Stroke Patients Only)       Balance Overall balance assessment: Needs assistance Sitting-balance support: Feet supported Sitting balance-Leahy Scale: Good     Standing balance support: During functional activity;Bilateral upper extremity supported Standing balance-Leahy Scale: Poor Standing balance comment: reliant on external assist                            Cognition Arousal/Alertness: Awake/alert Behavior During Therapy: WFL for tasks assessed/performed Overall Cognitive Status: Within Functional Limits for tasks assessed                                        Exercises Total Joint Exercises Ankle Circles/Pumps: AROM;Both;10 reps;Seated Quad Sets: AROM;Left;5 reps;Seated    General Comments        Pertinent Vitals/Pain Pain Assessment: 0-10 Pain Score: 1  Pain Location: Lt hip Pain Descriptors / Indicators: Aching;Discomfort;Sore Pain Intervention(s): Limited activity within patient's tolerance;Monitored during session;Repositioned    Home Living                      Prior Function            PT Goals (current goals can now be found in the care plan section) Acute Rehab PT Goals Patient Stated Goal: get back  home and independent again PT Goal Formulation: With patient Time For Goal Achievement: 01/09/21 Potential to Achieve Goals: Good Progress towards PT goals: Progressing toward goals    Frequency    7X/week      PT Plan Current plan remains appropriate    Co-evaluation              AM-PAC PT "6 Clicks" Mobility   Outcome Measure  Help needed turning from your back to your side while in a flat bed without using bedrails?: A Little Help needed moving from lying on your back to sitting on the side of a flat bed without using  bedrails?: A Little Help needed moving to and from a bed to a chair (including a wheelchair)?: A Little Help needed standing up from a chair using your arms (e.g., wheelchair or bedside chair)?: A Little Help needed to walk in hospital room?: A Little Help needed climbing 3-5 steps with a railing? : A Lot 6 Click Score: 17    End of Session Equipment Utilized During Treatment: Gait belt Activity Tolerance: Patient tolerated treatment well Patient left: in chair;with call bell/phone within reach;with family/visitor present (pt verbalizes she will press call button and wait for asist prior to mobilizing out of chair) Nurse Communication: Mobility status PT Visit Diagnosis: Muscle weakness (generalized) (M62.81);Difficulty in walking, not elsewhere classified (R26.2)     Time: 5521-7471 PT Time Calculation (min) (ACUTE ONLY): 23 min  Charges:  $Gait Training: 8-22 mins $Therapeutic Activity: 8-22 mins                     Stacie Glaze, PT DPT Acute Rehabilitation Services Pager [phone]  Office [phone]    [place] 01/03/2021, 9:28 AM

## 2021-01-03 NOTE — TOC Progression Note (Signed)
Transition of Care [facility]) - Progression Note    Patient Details  Name: ZEIDY TAYAG MRN: [id number] Date of Birth: 06/06/1961  [facility] CM/SW Contact  Joaquin Courts, RN Phone Number: 01/03/2021, 10:55 AM  Clinical Narrative:    Cm spoke with patient who plans to dc home with HHPT, [facility] to provide services.  [employer] to deliver rolling walker to bedside, patient reports has 3in1.   Expected Discharge Plan: [place] Barriers to Discharge: No Barriers Identified  Expected Discharge Plan and Services Expected Discharge Plan: [place]   Discharge Planning Services: CM Consult Post Acute Care Choice: Home Health   Expected Discharge Date: 01/03/21               DME Arranged: Gilford Rile rolling DME Agency: Other - Comment Celesta Aver) Date DME Agency Contacted: 01/03/21 Time DME Agency Contacted: 1054 Representative spoke with at DME Agency: Brenton Grills HH Arranged: PT [place]: [place]     Representative spoke with at [place]: pre-arranged in md office   Social Determinants of Health (SDOH) Interventions    Readmission Risk Interventions No flowsheet data found.

## 2021-01-03 NOTE — Progress Notes (Signed)
Physical Therapy Treatment Patient Details Name: Sylvia Davies MRN: [id number] DOB: 12/29/1960 Today's Date: 01/03/2021    History of Present Illness Patient is 60 y.o. female s/p Lt THA anterior approach on 01/02/21 with PMH significant for GERD, HLD, HTN, OA, Rt THA and revision.    PT Comments    Pt reporting little to no L hip pain, eager to practice steps, THA exercises, and d/c home. Pt proficiently completed stair navigation with close guard for safety, and completed all THA HEP without difficulty. Exercise handout administered, reviewed, and practiced. Pt with no further questions, will have assist of daughter and husband at d/c, pt appropriate to d/c from acute setting from a PT standpoint.    Follow Up Recommendations  Follow surgeon's recommendation for DC plan and follow-up therapies;Home health PT     Equipment Recommendations  Rolling walker with 5" wheels    Recommendations for Other Services       Precautions / Restrictions Precautions Precautions: Fall Restrictions Weight Bearing Restrictions: No LLE Weight Bearing: Weight bearing as tolerated    Mobility  Bed Mobility Overal bed mobility: Needs Assistance Bed Mobility: Supine to Sit;Sit to Supine     Supine to sit: Min assist Sit to supine: Min guard   General bed mobility comments: min assist for supine>sit for trunk elevation, min guard for return to supine for safety.    Transfers Overall transfer level: Needs assistance Equipment used: Rolling walker (2 wheeled) Transfers: Sit to/from Stand Sit to Stand: Supervision         General transfer comment: for safety, STS x4 during session.  Ambulation/Gait Ambulation/Gait assistance: Supervision Gait Distance (Feet): 45 Feet Assistive device: Rolling walker (2 wheeled) Gait Pattern/deviations: Step-through pattern;Decreased stride length;Trunk flexed;Antalgic Gait velocity: decr   General Gait Details: supervision for safety, verbal cuing  for upright posture and placement in RW   Stairs Stairs: Yes Stairs assistance: Min guard Stair Management: One rail Left;Sideways;Forwards;Step to pattern Number of Stairs: 3 General stair comments: min guard for safety, verbal cuing for sequencing (up with the "good leg" leading, down with the "bad leg" leading), foot placement on steps, step-to pattern   Wheelchair Mobility    Modified Rankin (Stroke Patients Only)       Balance Overall balance assessment: Needs assistance Sitting-balance support: Feet supported Sitting balance-Leahy Scale: Good     Standing balance support: During functional activity;Bilateral upper extremity supported Standing balance-Leahy Scale: Poor Standing balance comment: reliant on external assist                            Cognition Arousal/Alertness: Awake/alert Behavior During Therapy: WFL for tasks assessed/performed Overall Cognitive Status: Within Functional Limits for tasks assessed                                        Exercises Total Joint Exercises Ankle Circles/Pumps: AROM;Both;10 reps;Seated Quad Sets: AROM;Left;5 reps;Seated Short Arc Quad: AROM;Left;5 reps;Seated Heel Slides: AAROM;Left;5 reps;Seated Hip ABduction/ADduction: AROM;Left;5 reps;Seated Long Arc Quad: AROM;Left;10 reps;Seated    General Comments        Pertinent Vitals/Pain Pain Assessment: Faces Faces Pain Scale: No hurt Pain Location: Lt hip Pain Descriptors / Indicators: Aching;Discomfort;Sore Pain Intervention(s): Limited activity within patient's tolerance;Monitored during session;Repositioned    Home Living  Prior Function            PT Goals (current goals can now be found in the care plan section) Acute Rehab PT Goals Patient Stated Goal: get back home and independent again PT Goal Formulation: With patient Time For Goal Achievement: 01/09/21 Potential to Achieve Goals:  Good Progress towards PT goals: Progressing toward goals    Frequency    7X/week      PT Plan Current plan remains appropriate    Co-evaluation              AM-PAC PT "6 Clicks" Mobility   Outcome Measure  Help needed turning from your back to your side while in a flat bed without using bedrails?: A Little Help needed moving from lying on your back to sitting on the side of a flat bed without using bedrails?: A Little Help needed moving to and from a bed to a chair (including a wheelchair)?: A Little Help needed standing up from a chair using your arms (e.g., wheelchair or bedside chair)?: A Little Help needed to walk in hospital room?: A Little Help needed climbing 3-5 steps with a railing? : A Little 6 Click Score: 18    End of Session Equipment Utilized During Treatment: Gait belt Activity Tolerance: Patient tolerated treatment well Patient left: with call bell/phone within reach;in bed;with bed alarm set Nurse Communication: Mobility status PT Visit Diagnosis: Muscle weakness (generalized) (M62.81);Difficulty in walking, not elsewhere classified (R26.2)     Time: 7218-2883 PT Time Calculation (min) (ACUTE ONLY): 32 min  Charges:  $Gait Training: 8-22 mins $Therapeutic Exercise: 8-22 mins                     Stacie Glaze, PT DPT Acute Rehabilitation Services Pager [phone]  Office [phone]    [place] 01/03/2021, 1:33 PM

## 2021-01-03 NOTE — Discharge Instructions (Signed)

## 2021-01-04 ENCOUNTER — Encounter (HOSPITAL_COMMUNITY): Payer: Self-pay | Admitting: Orthopaedic Surgery

## 2021-01-16 ENCOUNTER — Telehealth: Payer: Self-pay | Admitting: Orthopaedic Surgery

## 2021-01-16 NOTE — Telephone Encounter (Signed)
I called pt and advised. She stated understanding  

## 2021-01-16 NOTE — Telephone Encounter (Signed)
Pt called requesting antibiotics for her vaginal infection. Please send to pharmacy on file. Patient phone number is [phone].

## 2021-01-19 ENCOUNTER — Encounter: Payer: Self-pay | Admitting: Orthopaedic Surgery

## 2021-01-19 ENCOUNTER — Ambulatory Visit (INDEPENDENT_AMBULATORY_CARE_PROVIDER_SITE_OTHER): Payer: BC Managed Care – PPO | Admitting: Orthopaedic Surgery

## 2021-01-19 DIAGNOSIS — Z96642 Presence of left artificial hip joint: Secondary | ICD-10-CM

## 2021-01-19 MED ORDER — METHOCARBAMOL 500 MG PO TABS: 500.0000 mg | ORAL_TABLET | Freq: Four times a day (QID) | ORAL | 1 refills | Status: DC | PRN

## 2021-01-19 NOTE — Progress Notes (Signed)
Patient is just over 2 weeks status post a left total hip replacement.  She is doing very well overall.  She is ambulating just using a cane.  She has been taking her baby aspirin twice a day.  She is only taking Tylenol for pain.  She is requesting a refill of Robaxin.  She denies any significant swelling in her legs.  Her feet are not swollen and the calf is soft on the left operative side.  Her staples have been removed and Steri-Strips applied.  There is no significant swelling or seroma.  Her leg lengths appear equal.  At this point she will continue to increase her activities as comfort allows.  She can go back to just her once a day baby aspirin.  I will send in some more Robaxin.  We will see her back in 4 weeks to see how she is doing overall but no x-rays are needed.  All questions and concerns were answered and addressed.

## 2021-01-29 ENCOUNTER — Telehealth: Payer: Self-pay

## 2021-01-29 ENCOUNTER — Ambulatory Visit: Payer: BC Managed Care – PPO | Admitting: Internal Medicine

## 2021-01-29 ENCOUNTER — Other Ambulatory Visit: Payer: Self-pay | Admitting: Family

## 2021-01-29 ENCOUNTER — Encounter: Payer: Self-pay | Admitting: Internal Medicine

## 2021-01-29 ENCOUNTER — Other Ambulatory Visit: Payer: Self-pay

## 2021-01-29 VITALS — BP 140/82 | HR 70 | Temp 98.4°F | Resp 16 | Ht 62.0 in | Wt 184.0 lb

## 2021-01-29 DIAGNOSIS — E785 Hyperlipidemia, unspecified: Secondary | ICD-10-CM | POA: Insufficient documentation

## 2021-01-29 DIAGNOSIS — I119 Hypertensive heart disease without heart failure: Secondary | ICD-10-CM | POA: Diagnosis not present

## 2021-01-29 DIAGNOSIS — Z0001 Encounter for general adult medical examination with abnormal findings: Secondary | ICD-10-CM | POA: Diagnosis not present

## 2021-01-29 DIAGNOSIS — I10 Essential (primary) hypertension: Secondary | ICD-10-CM | POA: Diagnosis not present

## 2021-01-29 DIAGNOSIS — R739 Hyperglycemia, unspecified: Secondary | ICD-10-CM | POA: Diagnosis not present

## 2021-01-29 DIAGNOSIS — Z23 Encounter for immunization: Secondary | ICD-10-CM

## 2021-01-29 LAB — CBC WITH DIFFERENTIAL/PLATELET
Basophils Absolute: 0 10*3/uL (ref 0.0–0.1)
Basophils Relative: 0.6 % (ref 0.0–3.0)
Eosinophils Absolute: 0.2 10*3/uL (ref 0.0–0.7)
Eosinophils Relative: 3.1 % (ref 0.0–5.0)
HCT: 37.7 % (ref 36.0–46.0)
Hemoglobin: 12.5 g/dL (ref 12.0–15.0)
Lymphocytes Relative: 40.7 % (ref 12.0–46.0)
Lymphs Abs: 2.2 10*3/uL (ref 0.7–4.0)
MCHC: 33 g/dL (ref 30.0–36.0)
MCV: 82.6 fl (ref 78.0–100.0)
Monocytes Absolute: 0.5 10*3/uL (ref 0.1–1.0)
Monocytes Relative: 8.4 % (ref 3.0–12.0)
Neutro Abs: 2.6 10*3/uL (ref 1.4–7.7)
Neutrophils Relative %: 47.2 % (ref 43.0–77.0)
Platelets: 252 10*3/uL (ref 150.0–400.0)
RBC: 4.56 Mil/uL (ref 3.87–5.11)
RDW: 15.4 % (ref 11.5–15.5)
WBC: 5.5 10*3/uL (ref 4.0–10.5)

## 2021-01-29 LAB — LIPID PANEL
Cholesterol: 181 mg/dL (ref 0–200)
HDL: 48.5 mg/dL (ref >39.00)
LDL Cholesterol: 108 mg/dL — ABNORMAL HIGH (ref 0–99)
NonHDL: 132.27
Total CHOL/HDL Ratio: 4
Triglycerides: 122 mg/dL (ref 0.0–149.0)
VLDL: 24.4 mg/dL (ref 0.0–40.0)

## 2021-01-29 LAB — HEMOGLOBIN A1C: Hgb A1c MFr Bld: 5.8 % (ref 4.6–6.5)

## 2021-01-29 LAB — TSH: TSH: 0.71 u[IU]/mL (ref 0.35–5.50)

## 2021-01-29 NOTE — Telephone Encounter (Signed)
Pt notified that her request has been accepted.  Pt states she would like TOC to Dr Sharlet Salina. TOC appt made for 07/27/20.

## 2021-01-29 NOTE — Telephone Encounter (Signed)
Pt is requesting a TOC to [place] or [place].  Sending to Dr Ronnald Ramp for approval.

## 2021-01-29 NOTE — Progress Notes (Signed)
Subjective:  Patient ID: Sylvia Davies, female    DOB: 1960-09-02  Age: 60 y.o. MRN: [id number]  CC: Annual Exam, Hypertension, and Hyperlipidemia  This visit occurred during the SARS-CoV-2 public health emergency.  Safety protocols were in place, including screening questions prior to the visit, additional usage of staff PPE, and extensive cleaning of exam room while observing appropriate contact time as indicated for disinfecting solutions.    HPI [name] presents for f/up and to establish.  She has not been exercising much recently because she recently underwent left total hip replacement.  She had a preoperative EKG that showed minimal LVH.  She thinks her blood pressure has been well controlled.  She denies headache, blurred vision, dizziness, lightheadedness, chest pain, shortness of breath, or edema.  Outpatient Medications Prior to Visit  Medication Sig Dispense Refill   acetaminophen (TYLENOL) 650 MG CR tablet Take 1,300 mg by mouth every 8 (eight) hours as needed for pain.     amLODipine (NORVASC) 5 MG tablet Take 1 tablet (5 mg total) by mouth daily. 90 tablet 3   aspirin 81 MG chewable tablet Chew 1 tablet (81 mg total) by mouth 2 (two) times daily. 30 tablet 0   atorvastatin (LIPITOR) 10 MG tablet TAKE 1 TABLET BY MOUTH EVERY DAY AT 6PM (Patient taking differently: Take 10 mg by mouth every evening. TAKE 1 TABLET BY MOUTH EVERY DAY AT 6PM) 90 tablet 3   cetirizine (ZYRTEC) 10 MG tablet Take 1 tablet (10 mg total) by mouth at bedtime. (Patient taking differently: Take 10 mg by mouth 2 (two) times daily.) 30 tablet 3   cholecalciferol (VITAMIN D3) 25 MCG (1000 UT) tablet Take 1,000 Units by mouth daily.     Fluocinolone Acetonide Scalp 0.01 % OIL SMARTSIG:1 Milliliter(s) Topical As Directed     fluticasone (FLONASE) 50 MCG/ACT nasal spray SPRAY 2 SPRAYS INTO EACH NOSTRIL EVERY DAY (Patient taking differently: Place 2 sprays into both nostrils in the morning and at  bedtime.) 48 mL 2   hydrochlorothiazide (HYDRODIURIL) 25 MG tablet Take 1 tablet (25 mg total) by mouth daily. 90 tablet 3   methocarbamol (ROBAXIN) 500 MG tablet Take 1 tablet (500 mg total) by mouth every 6 (six) hours as needed for muscle spasms. 30 tablet 1   Multiple Vitamins-Minerals (MULTIVITAMIN PO) Take 1 tablet by mouth daily.     vitamin B-12 (CYANOCOBALAMIN) 1000 MCG tablet Take 1,000 mcg by mouth daily.     No facility-administered medications prior to visit.    ROS Review of Systems  Constitutional:  Negative for chills, fatigue and fever.  HENT: Negative.  Negative for trouble swallowing.   Respiratory:  Negative for apnea, cough, shortness of breath and wheezing.   Cardiovascular:  Negative for chest pain, palpitations and leg swelling.  Gastrointestinal:  Negative for abdominal pain, constipation, diarrhea, nausea and vomiting.  Genitourinary:  Negative for difficulty urinating.  Musculoskeletal:  Positive for arthralgias. Negative for back pain and myalgias.  Skin: Negative.   Neurological: Negative.  Negative for dizziness, weakness and headaches.  Hematological:  Negative for adenopathy. Does not bruise/bleed easily.  Psychiatric/Behavioral: Negative.     Objective:  BP 140/82 (BP Location: Right Arm, Patient Position: Sitting, Cuff Size: Large)   Pulse 70   Temp 98.4 F (36.9 C) (Oral)   Resp 16   Ht '5\' 2"'$  (1.575 m)   Wt 184 lb (83.5 kg)   LMP 07/31/2013   SpO2 97%   BMI 33.65 kg/m  BP Readings from Last 3 Encounters:  01/29/21 140/82  01/03/21 140/67  12/25/20 (!) 145/75    Wt Readings from Last 3 Encounters:  01/29/21 184 lb (83.5 kg)  01/02/21 185 lb (83.9 kg)  12/25/20 185 lb (83.9 kg)    Physical Exam Vitals reviewed.  HENT:     Mouth/Throat:     Mouth: Mucous membranes are moist.  Eyes:     General: No scleral icterus.    Conjunctiva/sclera: Conjunctivae normal.  Cardiovascular:     Rate and Rhythm: Normal rate.     Heart sounds:  No murmur heard. Pulmonary:     Effort: Pulmonary effort is normal.     Breath sounds: No stridor. No wheezing, rhonchi or rales.  Abdominal:     General: Abdomen is flat.     Palpations: There is no mass.     Tenderness: There is no abdominal tenderness. There is no guarding.  Musculoskeletal:        General: Normal range of motion.     Cervical back: Neck supple.     Right lower leg: No edema.     Left lower leg: No edema.  Lymphadenopathy:     Cervical: No cervical adenopathy.  Skin:    General: Skin is warm and dry.  Neurological:     General: No focal deficit present.     Mental Status: She is alert.  Psychiatric:        Mood and Affect: Mood normal.        Behavior: Behavior normal.    Lab Results  Component Value Date   WBC 5.5 01/29/2021   HGB 12.5 01/29/2021   HCT 37.7 01/29/2021   PLT 252.0 01/29/2021   GLUCOSE 119 (H) 01/03/2021   CHOL 181 01/29/2021   TRIG 122.0 01/29/2021   HDL 48.50 01/29/2021   LDLDIRECT 144.4 10/16/2012   LDLCALC 108 (H) 01/29/2021   ALT 14 01/28/2020   AST 17 01/28/2020   NA 138 01/03/2021   K 4.0 01/03/2021   CL 107 01/03/2021   CREATININE 0.60 01/03/2021   BUN 11 01/03/2021   CO2 24 01/03/2021   TSH 0.71 01/29/2021   HGBA1C 5.8 01/29/2021    No results found.  Assessment & Plan:   Matilda was seen today for annual exam, hypertension and hyperlipidemia.  Diagnoses and all orders for this visit:  Hyperglycemia- Her A1c is normal at 5.8%. -     Hemoglobin A1c; Future -     Hemoglobin A1c  Hyperlipidemia with target LDL less than 130- LDL goal achieved. Doing well on the statin  -     Lipid panel; Future -     TSH; Future -     TSH -     Lipid panel  Encounter for general adult medical examination with abnormal findings- Exam completed, labs reviewed, vaccines reviewed and updated, cancer screenings are up-to-date, patient education was given.  HYPERTENSION, BENIGN ESSENTIAL- Her blood pressure is not quite  adequately well controlled.  Will continue the current antihypertensives and she will restart her exercise regimen when her hip has recovered. -     CBC with Differential/Platelet; Future -     CBC with Differential/Platelet  LVH (left ventricular hypertrophy) due to hypertensive disease, without heart failure- There is no evidence of heart failure.  Will work to get better control of her blood pressure.  Need for shingles vaccine -     Zoster Vaccine Adjuvanted [facility]) injection; Inject 0.5 mLs into the muscle once  for 1 dose.  I am having Jahara B. Ghazi start on Shingrix. I am also having her maintain her Multiple Vitamins-Minerals (MULTIVITAMIN PO), cholecalciferol, cetirizine, amLODipine, atorvastatin, hydrochlorothiazide, fluticasone, vitamin B-12, acetaminophen, aspirin, methocarbamol, and Fluocinolone Acetonide Scalp.  Meds ordered this encounter  Medications   Zoster Vaccine Adjuvanted [facility]) injection    Sig: Inject 0.5 mLs into the muscle once for 1 dose.    Dispense:  0.5 mL    Refill:  1      Follow-up: Return in about 6 months (around 08/01/2021).  Scarlette Calico, MD

## 2021-01-29 NOTE — Patient Instructions (Signed)
Health Maintenance, Female Adopting a healthy lifestyle and getting preventive care are important in promoting health and wellness. Ask your health care provider about: The right schedule for you to have regular tests and exams. Things you can do on your own to prevent diseases and keep yourself healthy. What should I know about diet, weight, and exercise? Eat a healthy diet  Eat a diet that includes plenty of vegetables, fruits, low-fat dairy products, and lean protein. Do not eat a lot of foods that are high in solid fats, added sugars, or sodium.  Maintain a healthy weight Body mass index (BMI) is used to identify weight problems. It estimates body fat based on height and weight. Your health care provider can help determineyour BMI and help you achieve or maintain a healthy weight. Get regular exercise Get regular exercise. This is one of the most important things you can do for your health. Most adults should: Exercise for at least 150 minutes each week. The exercise should increase your heart rate and make you sweat (moderate-intensity exercise). Do strengthening exercises at least twice a week. This is in addition to the moderate-intensity exercise. Spend less time sitting. Even light physical activity can be beneficial. Watch cholesterol and blood lipids Have your blood tested for lipids and cholesterol at 62 years of age, then havethis test every 5 years. Have your cholesterol levels checked more often if: Your lipid or cholesterol levels are high. You are older than 62 years of age. You are at high risk for heart disease. What should I know about cancer screening? Depending on your health history and family history, you may need to have cancer screening at various ages. This may include screening for: Breast cancer. Cervical cancer. Colorectal cancer. Skin cancer. Lung cancer. What should I know about heart disease, diabetes, and high blood pressure? Blood pressure and heart  disease High blood pressure causes heart disease and increases the risk of stroke. This is more likely to develop in people who have high blood pressure readings, are of African descent, or are overweight. Have your blood pressure checked: Every 3-5 years if you are 18-39 years of age. Every year if you are 40 years old or older. Diabetes Have regular diabetes screenings. This checks your fasting blood sugar level. Have the screening done: Once every three years after age 40 if you are at a normal weight and have a low risk for diabetes. More often and at a younger age if you are overweight or have a high risk for diabetes. What should I know about preventing infection? Hepatitis B If you have a higher risk for hepatitis B, you should be screened for this virus. Talk with your health care provider to find out if you are at risk forhepatitis B infection. Hepatitis C Testing is recommended for: Everyone born from 1945 through 1965. Anyone with known risk factors for hepatitis C. Sexually transmitted infections (STIs) Get screened for STIs, including gonorrhea and chlamydia, if: You are sexually active and are younger than 62 years of age. You are older than 62 years of age and your health care provider tells you that you are at risk for this type of infection. Your sexual activity has changed since you were last screened, and you are at increased risk for chlamydia or gonorrhea. Ask your health care provider if you are at risk. Ask your health care provider about whether you are at high risk for HIV. Your health care provider may recommend a prescription medicine to help   prevent HIV infection. If you choose to take medicine to prevent HIV, you should first get tested for HIV. You should then be tested every 3 months for as long as you are taking the medicine. Pregnancy If you are about to stop having your period (premenopausal) and you may become pregnant, seek counseling before you get  pregnant. Take 400 to 800 micrograms (mcg) of folic acid every day if you become pregnant. Ask for birth control (contraception) if you want to prevent pregnancy. Osteoporosis and menopause Osteoporosis is a disease in which the bones lose minerals and strength with aging. This can result in bone fractures. If you are 65 years old or older, or if you are at risk for osteoporosis and fractures, ask your health care provider if you should: Be screened for bone loss. Take a calcium or vitamin D supplement to lower your risk of fractures. Be given hormone replacement therapy (HRT) to treat symptoms of menopause. Follow these instructions at home: Lifestyle Do not use any products that contain nicotine or tobacco, such as cigarettes, e-cigarettes, and chewing tobacco. If you need help quitting, ask your health care provider. Do not use street drugs. Do not share needles. Ask your health care provider for help if you need support or information about quitting drugs. Alcohol use Do not drink alcohol if: Your health care provider tells you not to drink. You are pregnant, may be pregnant, or are planning to become pregnant. If you drink alcohol: Limit how much you use to 0-1 drink a day. Limit intake if you are breastfeeding. Be aware of how much alcohol is in your drink. In the [place] one drink equals one 12 oz bottle of beer (355 mL), one 5 oz glass of wine (148 mL), or one 1 oz glass of hard liquor (44 mL). General instructions Schedule regular health, dental, and eye exams. Stay current with your vaccines. Tell your health care provider if: You often feel depressed. You have ever been abused or do not feel safe at home. Summary Adopting a healthy lifestyle and getting preventive care are important in promoting health and wellness. Follow your health care provider's instructions about healthy diet, exercising, and getting tested or screened for diseases. Follow your health care provider's  instructions on monitoring your cholesterol and blood pressure. This information is not intended to replace advice given to you by your health care provider. Make sure you discuss any questions you have with your healthcare provider. Document Revised: 06/21/2018 Document Reviewed: 06/21/2018 Elsevier Patient Education  2022 Elsevier Inc.  

## 2021-01-30 ENCOUNTER — Encounter: Payer: BC Managed Care – PPO | Admitting: Family

## 2021-01-30 DIAGNOSIS — I119 Hypertensive heart disease without heart failure: Secondary | ICD-10-CM | POA: Insufficient documentation

## 2021-01-30 MED ORDER — AMLODIPINE BESYLATE 5 MG PO TABS: 5.0000 mg | ORAL_TABLET | Freq: Every day | ORAL | 1 refills | Status: DC

## 2021-01-30 MED ORDER — ATORVASTATIN CALCIUM 10 MG PO TABS: ORAL_TABLET | ORAL | 1 refills | Status: DC

## 2021-01-30 MED ORDER — SHINGRIX 50 MCG/0.5ML IM SUSR: 0.5000 mL | Freq: Once | INTRAMUSCULAR | 1 refills | Status: AC

## 2021-01-30 MED ORDER — HYDROCHLOROTHIAZIDE 25 MG PO TABS: 25.0000 mg | ORAL_TABLET | Freq: Every day | ORAL | 1 refills | Status: DC

## 2021-02-16 ENCOUNTER — Other Ambulatory Visit: Payer: Self-pay

## 2021-02-16 ENCOUNTER — Encounter: Payer: Self-pay | Admitting: Orthopaedic Surgery

## 2021-02-16 ENCOUNTER — Ambulatory Visit (INDEPENDENT_AMBULATORY_CARE_PROVIDER_SITE_OTHER): Payer: BC Managed Care – PPO | Admitting: Orthopaedic Surgery

## 2021-02-16 DIAGNOSIS — Z96642 Presence of left artificial hip joint: Secondary | ICD-10-CM

## 2021-02-16 MED ORDER — METHOCARBAMOL 500 MG PO TABS: 500.0000 mg | ORAL_TABLET | Freq: Four times a day (QID) | ORAL | 2 refills | Status: DC | PRN

## 2021-02-16 NOTE — Progress Notes (Signed)
The patient is now between 6 and 7 weeks status post a left hip replacement.  She says she is doing well overall and has really no concerns.  Examination of her left hip shows it moves smoothly and fluidly.  There is some IT band and trochanteric pain which is to be expected.  Her leg lengths are equal.  She is walking without an assistive device.  She is a very active 60 year old female.  She does need a refill of her muscle relaxant and I agree with this.  From my standpoint I do not need to see her back for 6 months unless there are issues.  At that visit we will have a standing low AP pelvis and lateral of her left operative hip.  All questions and concerns were answered and addressed.

## 2021-04-15 ENCOUNTER — Ambulatory Visit: Payer: BC Managed Care – PPO | Admitting: Allergy & Immunology

## 2021-05-08 ENCOUNTER — Ambulatory Visit: Payer: BC Managed Care – PPO | Admitting: Allergy & Immunology

## 2021-05-08 ENCOUNTER — Ambulatory Visit: Payer: BC Managed Care – PPO | Admitting: Allergy

## 2021-05-08 ENCOUNTER — Other Ambulatory Visit: Payer: Self-pay

## 2021-05-08 ENCOUNTER — Encounter: Payer: Self-pay | Admitting: Allergy

## 2021-05-08 VITALS — BP 150/90 | HR 83 | Temp 98.2°F | Resp 18 | Ht 62.0 in | Wt 186.6 lb

## 2021-05-08 DIAGNOSIS — J3089 Other allergic rhinitis: Secondary | ICD-10-CM

## 2021-05-08 MED ORDER — LEVOCETIRIZINE DIHYDROCHLORIDE 5 MG PO TABS: 5.0000 mg | ORAL_TABLET | Freq: Every evening | ORAL | 5 refills | Status: DC

## 2021-05-08 MED ORDER — AZELASTINE-FLUTICASONE 137-50 MCG/ACT NA SUSP: 1.0000 | Freq: Two times a day (BID) | NASAL | 5 refills | Status: DC

## 2021-05-08 NOTE — Patient Instructions (Addendum)
-  environmental allergy testing is non-reactive today thus will obtain environmental panel via serum IgE levels -allergen avoidance measures discussed/handouts provided -change Zyrtec to Xyzal 5mg  or Allegra 180mg  daily.  Xyzal and Allegra are long-acting antihistamines that may be more effective than Zyrtec.  Both are over-the-counter options.  Samples provided -for nasal congestion and drainage control recommend a combination nasal spray that has a nasal steroid component for nasal congestion control and a nasal antihistamine component for nasal drainage control.  Provided with sample of Ryaltris today that you can use 2 sprays each nostril twice a day.  The other combo spray option is Dymista.  Will see which of these options may be covered for you -if blood work shows environmental allergens then you would be eligible for allergen immunotherapy.  Allergen immunotherapy discussed today including protocol, benefits and risk.   Follow-up in 4-6 months or sooner if needed

## 2021-05-08 NOTE — Progress Notes (Signed)
New Patient Note  RE: Sylvia Davies MRN: [id number] DOB: Jan 14, 1961 Date of Office Visit: 05/08/2021  Referring provider: [name],* Primary care provider: [name], MD  Chief Complaint: Allergies  History of present illness: Sylvia Davies is a 60 y.o. female presenting today for consultation for allergic rhinitis She is a former pt of our practice previously seeing Dr. Shaune Leeks.   She returns today for symptoms of nasal drainage, itchy throat and sore throat and itchy ear. She does throat throat clear often as well.  She states she has nasal congestion in the mornings more so.  Symptoms are year-round but worse during changes of season.   She takes OTC Allertec twice a day as needed that "kinda helps".  She also uses flonase 2 spray each nostril twice a day as needed that does help temporarily.    She had allergy testing around '97/'98 and recalls being allergic to mold/mildew and dust.    No history asthma, eczema or food allergy.   Review of systems: Review of Systems  Constitutional: Negative.   HENT:         See HPI  Eyes: Negative.   Respiratory: Negative.    Cardiovascular: Negative.   Gastrointestinal: Negative.   Musculoskeletal: Negative.   Skin: Negative.   Neurological: Negative.    All other systems negative unless noted above in HPI  Past medical history: Past Medical History:  Diagnosis Date   ALLERGIC RHINITIS 04/02/2007   Allergy    Arthritis    BREAST BIOPSY, HX OF 04/02/2007   Diverticulosis    FIBROCYSTIC BREAST DISEASE, HX OF 04/02/2007   GERD (gastroesophageal reflux disease)    Heart murmur    Hemorrhoids    HYPERLIPIDEMIA 04/02/2007   HYPERTENSION, BENIGN ESSENTIAL 05/04/2007   Impaired glucose tolerance 11/08/2012   Irritable bowel syndrome 04/02/2007   MITRAL REGURGITATION, 0 (MILD) 04/02/2007   OBESITY 04/02/2007    Past surgical history: Past Surgical History:  Procedure Laterality Date    BREAST BIOPSY Left    BREAST EXCISIONAL BIOPSY Left    benign   COLONOSCOPY     DG 5TH DIGIT LEFT FOOT Left    DILATATION & CURETTAGE/HYSTEROSCOPY WITH MYOSURE N/A 06/02/2016   Procedure: DILATATION & CURETTAGE/HYSTEROSCOPY WITH MYOSURE;  Surgeon: Thurnell Lose, MD;  Location: [facility];  Service: Gynecology;  Laterality: N/A;  Myosure - Endometrial Mass   EYE SURGERY Left    tear duct   JOINT REPLACEMENT     Right total hip Dr. Felicie Morn 04-28-18   LEG SURGERY Left    have rods and srcews   TOTAL HIP ARTHROPLASTY Right 04/28/2018   Procedure: RIGHT TOTAL HIP ARTHROPLASTY ANTERIOR APPROACH;  Surgeon: Mcarthur Rossetti, MD;  Location: [facility];  Service: Orthopedics;  Laterality: Right;   TOTAL HIP ARTHROPLASTY Left 01/02/2021   Procedure: LEFT TOTAL HIP ARTHROPLASTY ANTERIOR APPROACH;  Surgeon: Mcarthur Rossetti, MD;  Location: [facility];  Service: Orthopedics;  Laterality: Left;    Family history:  Family History  Problem Relation Age of Onset   Hypertension Mother    Diabetes Maternal Grandmother    Colon cancer Neg Hx    Colon polyps Neg Hx    Esophageal cancer Neg Hx    Rectal cancer Neg Hx    Stomach cancer Neg Hx     Social history: Lives in a home with carpeting in the bedroom with electric heating and central cooling.  No concern for water damage, mildew or roaches  in the home.  She does not report a smoking history.  She works in the school system.   Medication List: Current Outpatient Medications  Medication Sig Dispense Refill   acetaminophen (TYLENOL) 650 MG CR tablet Take 1,300 mg by mouth every 8 (eight) hours as needed for pain.     amLODipine (NORVASC) 5 MG tablet Take 1 tablet (5 mg total) by mouth daily. 90 tablet 1   aspirin 81 MG chewable tablet Chew 1 tablet (81 mg total) by mouth 2 (two) times daily. 30 tablet 0   atorvastatin (LIPITOR) 10 MG tablet TAKE 1 TABLET BY MOUTH EVERY DAY AT 6PM 90 tablet 1   Azelastine-Fluticasone (DYMISTA) 137-50 MCG/ACT  SUSP Place 1 spray into both nostrils in the morning and at bedtime. 23 g 5   cholecalciferol (VITAMIN D3) 25 MCG (1000 UT) tablet Take 1,000 Units by mouth daily.     Fluocinolone Acetonide Scalp 0.01 % OIL SMARTSIG:1 Milliliter(s) Topical As Directed     hydrochlorothiazide (HYDRODIURIL) 25 MG tablet Take 1 tablet (25 mg total) by mouth daily. 90 tablet 1   levocetirizine (XYZAL) 5 MG tablet Take 1 tablet (5 mg total) by mouth every evening. 30 tablet 5   methocarbamol (ROBAXIN) 500 MG tablet Take 1 tablet (500 mg total) by mouth every 6 (six) hours as needed for muscle spasms. 30 tablet 2   Multiple Vitamins-Minerals (MULTIVITAMIN PO) Take 1 tablet by mouth daily.     vitamin B-12 (CYANOCOBALAMIN) 1000 MCG tablet Take 1,000 mcg by mouth daily.     No current facility-administered medications for this visit.    Known medication allergies: Allergies  Allergen Reactions   Tramadol Hives   Amlodipine Besy-Benazepril Hcl Itching    REACTION:sore throat   Penicillins     Has patient had a PCN reaction causing immediate rash, facial/tongue/throat swelling, SOB or lightheadedness with hypotension: no Has patient had a PCN reaction causing severe rash involving mucus membranes or skin necrosis: no Has patient had a PCN reaction that required hospitalization no Has patient had a PCN reaction occurring within the last 10 years: yes If all of the above answers are "NO", then may proceed with Cephalosporin use.    Cleocin [Clindamycin Hcl] Itching   Hyoscyamine Other (See Comments)    Intolerance    Zylet [Loteprednol-Tobramycin] Itching     Physical examination: Blood pressure (!) 150/90, pulse 83, temperature 98.2 F (36.8 C), temperature source Temporal, resp. rate 18, height 5\' 2"  (1.575 m), weight 186 lb 9.6 oz (84.6 kg), last menstrual period 07/31/2013, SpO2 98 %.  General: Alert, interactive, in no acute distress. HEENT: PERRLA, TMs pearly gray, turbinates mildly edematous without  discharge, post-pharynx non erythematous. Neck: Supple without lymphadenopathy. Lungs: Clear to auscultation without wheezing, rhonchi or rales. {no increased work of breathing. CV: Normal S1, S2 without murmurs. Abdomen: Nondistended, nontender. Skin: Warm and dry, without lesions or rashes. Extremities:  No clubbing, cyanosis or edema. Neuro:   Grossly intact.  Diagnositics/Labs:  Allergy testing: Environmental allergy skin prick testing is nonreactive including histamine. Allergy testing results were read and interpreted by provider, documented by clinical staff.   Assessment and plan: Allergic rhinitis -environmental allergy testing is non-reactive today thus will obtain environmental panel via serum IgE levels -allergen avoidance measures discussed/handouts provided -change Zyrtec to Xyzal 5mg  or Allegra 180mg  daily.  Xyzal and Allegra are long-acting antihistamines that may be more effective than Zyrtec.  Both are over-the-counter options.  Samples provided -for nasal congestion and drainage  control recommend a combination nasal spray that has a nasal steroid component for nasal congestion control and a nasal antihistamine component for nasal drainage control.  Provided with sample of Ryaltris today that you can use 2 sprays each nostril twice a day.  The other combo spray option is Dymista.  Will see which of these options may be covered for you -if blood work shows environmental allergens then you would be eligible for allergen immunotherapy.  Allergen immunotherapy discussed today including protocol, benefits and risk.   Follow-up in 4-6 months or sooner if needed  I appreciate the opportunity to take part in Sakara's care. Please do not hesitate to contact me with questions.  Sincerely,   Prudy Feeler, MD Allergy/Immunology [facility]

## 2021-05-15 LAB — ALLERGENS W/TOTAL IGE AREA 2

## 2021-05-22 ENCOUNTER — Other Ambulatory Visit: Payer: Self-pay | Admitting: Internal Medicine

## 2021-05-22 DIAGNOSIS — I10 Essential (primary) hypertension: Secondary | ICD-10-CM

## 2021-05-22 DIAGNOSIS — E785 Hyperlipidemia, unspecified: Secondary | ICD-10-CM

## 2021-05-22 DIAGNOSIS — I119 Hypertensive heart disease without heart failure: Secondary | ICD-10-CM

## 2021-05-25 ENCOUNTER — Telehealth: Payer: Self-pay | Admitting: Internal Medicine

## 2021-05-25 NOTE — Telephone Encounter (Signed)
Pt has new pcp and needs medication refilled. Pt stated since Valere Dross was the last to refill medication [facility] advised her to contact office. If any new questions develop please contact pt @ [phone] or [phone]. Please advise.   atorvastatin (LIPITOR) 10 MG tablet [[id number]] amLODipine (NORVASC) 5 MG tablet [[id number]] [id number] - [place], [place] - [address] AT [address]  [address], [place] [address]  Phone:  [phone]  Fax:  [phone]

## 2021-05-26 ENCOUNTER — Other Ambulatory Visit: Payer: Self-pay | Admitting: Family

## 2021-05-26 DIAGNOSIS — I119 Hypertensive heart disease without heart failure: Secondary | ICD-10-CM

## 2021-05-26 DIAGNOSIS — I10 Essential (primary) hypertension: Secondary | ICD-10-CM

## 2021-05-26 DIAGNOSIS — E785 Hyperlipidemia, unspecified: Secondary | ICD-10-CM

## 2021-05-26 MED ORDER — AMLODIPINE BESYLATE 5 MG PO TABS: 5.0000 mg | ORAL_TABLET | Freq: Every day | ORAL | 0 refills | Status: DC

## 2021-05-26 MED ORDER — ATORVASTATIN CALCIUM 10 MG PO TABS: ORAL_TABLET | ORAL | 0 refills | Status: DC

## 2021-05-26 NOTE — Telephone Encounter (Signed)
I have called pt back and relayed the message from the provider via VM.

## 2021-07-27 ENCOUNTER — Telehealth: Payer: Self-pay | Admitting: Internal Medicine

## 2021-07-27 ENCOUNTER — Encounter: Payer: Self-pay | Admitting: Internal Medicine

## 2021-07-27 ENCOUNTER — Ambulatory Visit: Payer: BC Managed Care – PPO | Admitting: Internal Medicine

## 2021-07-27 ENCOUNTER — Other Ambulatory Visit: Payer: Self-pay

## 2021-07-27 DIAGNOSIS — E782 Mixed hyperlipidemia: Secondary | ICD-10-CM

## 2021-07-27 DIAGNOSIS — R7302 Impaired glucose tolerance (oral): Secondary | ICD-10-CM

## 2021-07-27 DIAGNOSIS — E6609 Other obesity due to excess calories: Secondary | ICD-10-CM

## 2021-07-27 DIAGNOSIS — I10 Essential (primary) hypertension: Secondary | ICD-10-CM

## 2021-07-27 DIAGNOSIS — K589 Irritable bowel syndrome without diarrhea: Secondary | ICD-10-CM

## 2021-07-27 DIAGNOSIS — Z6833 Body mass index (BMI) 33.0-33.9, adult: Secondary | ICD-10-CM

## 2021-07-27 MED ORDER — PRAVASTATIN SODIUM 20 MG PO TABS: 20.0000 mg | ORAL_TABLET | Freq: Every day | ORAL | 3 refills | Status: DC

## 2021-07-27 NOTE — Patient Instructions (Signed)
Work on portions for food and reducing carbohydrates.

## 2021-07-27 NOTE — Telephone Encounter (Signed)
Spoke with Dr. Sharlet Salina and she advised me to tell the pt that she would do better with pravastatin compared to atorvastatin due to the itching. Pt verbalized understanding.

## 2021-07-27 NOTE — Progress Notes (Signed)
° °  Subjective:   Patient ID: Sylvia Davies, female    DOB: 1961/01/12, 61 y.o.   MRN: [id number]  HPI The patient is a 61 YO female coming in for [facility] and some concerns.   Review of Systems  Constitutional:  Positive for unexpected weight change.  HENT: Negative.    Eyes: Negative.   Respiratory:  Negative for cough, chest tightness and shortness of breath.   Cardiovascular:  Negative for chest pain, palpitations and leg swelling.  Gastrointestinal:  Negative for abdominal distention, abdominal pain, constipation, diarrhea, nausea and vomiting.  Musculoskeletal: Negative.   Skin: Negative.        itching  Neurological: Negative.   Psychiatric/Behavioral: Negative.     Objective:  Physical Exam Constitutional:      Appearance: She is well-developed.  HENT:     Head: Normocephalic and atraumatic.  Cardiovascular:     Rate and Rhythm: Normal rate and regular rhythm.  Pulmonary:     Effort: Pulmonary effort is normal. No respiratory distress.     Breath sounds: Normal breath sounds. No wheezing or rales.  Abdominal:     General: Bowel sounds are normal. There is no distension.     Palpations: Abdomen is soft.     Tenderness: There is no abdominal tenderness. There is no rebound.  Musculoskeletal:     Cervical back: Normal range of motion.  Skin:    General: Skin is warm and dry.  Neurological:     Mental Status: She is alert and oriented to person, place, and time.     Coordination: Coordination normal.    Vitals:   07/27/21 1025  BP: 128/80  Pulse: 60  Resp: 18  SpO2: 98%  Weight: 184 lb 6.4 oz (83.6 kg)  Height: 5\' 2"  (1.575 m)    This visit occurred during the SARS-CoV-2 public health emergency.  Safety protocols were in place, including screening questions prior to the visit, additional usage of staff PPE, and extensive cleaning of exam room while observing appropriate contact time as indicated for disinfecting solutions.   Assessment & Plan:  Visit time 25  minutes in face to face communication with patient and coordination of care, additional 10 minutes spent in record review, coordination or care, ordering tests, communicating/referring to other healthcare professionals, documenting in medical records all on the same day of the visit for total time 35 minutes spent on the visit.

## 2021-07-27 NOTE — Telephone Encounter (Signed)
Patient calling in  Patient had visit today 01/16 & med was changed to pravastatin (PRAVACHOL) 20 MG tablet  Wants to know why it was changed & wants to make sure the dosage is correct

## 2021-07-29 ENCOUNTER — Other Ambulatory Visit: Payer: Self-pay | Admitting: Internal Medicine

## 2021-07-29 ENCOUNTER — Other Ambulatory Visit: Payer: Self-pay | Admitting: Allergy

## 2021-07-29 DIAGNOSIS — I119 Hypertensive heart disease without heart failure: Secondary | ICD-10-CM

## 2021-07-29 DIAGNOSIS — I10 Essential (primary) hypertension: Secondary | ICD-10-CM

## 2021-07-29 NOTE — Assessment & Plan Note (Signed)
Overall stable and controlled with diet.

## 2021-07-29 NOTE — Assessment & Plan Note (Signed)
BP at goal on amlodipine 5 mg daily and hctz 25 mg daily. She is working on weight loss and we could consider to try to stop amlodipine 5 mg daily if weight loss.

## 2021-07-29 NOTE — Assessment & Plan Note (Signed)
We did talk significantly about diet and exercise to help. We talked about different medications to help and we decided to hold off on this for now.

## 2021-07-29 NOTE — Assessment & Plan Note (Signed)
Prior HgA1c reviewed and discussed with patient. Counseled about reduction to carbohydrates and portions to help avoid progression to diabetes. Offered nutrition referral which she does not feel she needs at this time.

## 2021-07-29 NOTE — Assessment & Plan Note (Signed)
She feels that her lipitor is causing itching. We will switch to pravastatin 20 mg daily and stop lipitor 10 mg daily today. Plan to recheck lipid panel in 3-6 months.

## 2021-08-18 ENCOUNTER — Telehealth: Payer: Self-pay | Admitting: *Deleted

## 2021-08-18 NOTE — Telephone Encounter (Signed)
Patient called the office stating she gets a sore throat after using nasal spray.  Reviewed technique and patient does not want to try changing technique or another spray.  Patient does take Levocetirizine 5 mg every evening. [facility].

## 2021-08-19 ENCOUNTER — Ambulatory Visit: Payer: BC Managed Care – PPO | Admitting: Orthopaedic Surgery

## 2021-08-31 ENCOUNTER — Ambulatory Visit: Payer: BC Managed Care – PPO | Admitting: Podiatry

## 2021-08-31 ENCOUNTER — Encounter: Payer: Self-pay | Admitting: Orthopaedic Surgery

## 2021-08-31 ENCOUNTER — Encounter: Payer: Self-pay | Admitting: Podiatry

## 2021-08-31 ENCOUNTER — Other Ambulatory Visit: Payer: Self-pay

## 2021-08-31 ENCOUNTER — Ambulatory Visit: Payer: Self-pay

## 2021-08-31 ENCOUNTER — Ambulatory Visit (INDEPENDENT_AMBULATORY_CARE_PROVIDER_SITE_OTHER): Payer: BC Managed Care – PPO | Admitting: Orthopaedic Surgery

## 2021-08-31 DIAGNOSIS — M1612 Unilateral primary osteoarthritis, left hip: Secondary | ICD-10-CM

## 2021-08-31 DIAGNOSIS — Z96642 Presence of left artificial hip joint: Secondary | ICD-10-CM | POA: Diagnosis not present

## 2021-08-31 DIAGNOSIS — M2041 Other hammer toe(s) (acquired), right foot: Secondary | ICD-10-CM | POA: Diagnosis not present

## 2021-08-31 DIAGNOSIS — Q666 Other congenital valgus deformities of feet: Secondary | ICD-10-CM

## 2021-08-31 DIAGNOSIS — L84 Corns and callosities: Secondary | ICD-10-CM

## 2021-08-31 DIAGNOSIS — M2042 Other hammer toe(s) (acquired), left foot: Secondary | ICD-10-CM

## 2021-08-31 MED ORDER — METHOCARBAMOL 500 MG PO TABS: 500.0000 mg | ORAL_TABLET | Freq: Three times a day (TID) | ORAL | 2 refills | Status: DC | PRN

## 2021-08-31 NOTE — Progress Notes (Signed)
Subjective:   Patient ID: Sylvia Davies, female   DOB: 61 y.o.   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  Today debridement accomplished padding accomplished wider shoes and will be seen back as needed with extensive education given to patient

## 2021-08-31 NOTE — Progress Notes (Signed)
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  We replaced her right hip in 2019.  She says that both hips are doing great and she has no issues at all.  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  She reports good strength and good motion of her hips and is very pleased overall.  She is 61 years old and very active.  On exam both hips move smoothly and fluidly.  She is walking without a limp or an assistive device.  Her leg lengths are equal.  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  At this point follow-up can be as needed since she is doing well.  All questions and concerns were answered and addressed.  She did want me to send in some more Robaxin which I agree with.  If she does develop any issues around her hips she knows to come see [place].

## 2021-09-08 NOTE — Telephone Encounter (Signed)
Called patient - LMOVM to contact office regarding options for nasal sprays.

## 2021-09-08 NOTE — Telephone Encounter (Signed)
Patient called back and needs to have her nasel spray change because she can not take it. [facility] [address] . [phone].

## 2021-09-09 ENCOUNTER — Telehealth: Payer: Self-pay

## 2021-09-09 NOTE — Telephone Encounter (Signed)
Ok to send in [place]? ?

## 2021-09-09 NOTE — Telephone Encounter (Signed)
Called and left a voicemail asking for patient to return call to discuss.  °

## 2021-09-09 NOTE — Telephone Encounter (Signed)
It looks like she has already called her allergy doctor about this and they wanted her to call them back.  ?

## 2021-09-09 NOTE — Telephone Encounter (Signed)
Pt is requesting to get a refill on: ?fluticasone (FLONASE) 50 MCG/ACT nasal spray  ? ?[facility]acy: [id number] [place] [place] - [address] AT [place] ? ?This medication is on the D/C med list. ? ?Pt has this medication current on med list and say she cant take Azelastine-Fluticasone (DYMISTA) 137-50 MCG/ACT SUSP due it causing a really bad sore throat.  ? ?[phone] Pt CB ?

## 2021-09-10 MED ORDER — FLUTICASONE PROPIONATE 50 MCG/ACT NA SUSP: 2.0000 | Freq: Two times a day (BID) | NASAL | 5 refills | Status: DC | PRN

## 2021-09-10 NOTE — Telephone Encounter (Signed)
[facility] [address]  ?

## 2021-09-10 NOTE — Telephone Encounter (Signed)
Patient returned Sylvia Davies's call. Patient states she would like flonase to be sent in instead, she has used it before and it works well for her.  ? ?Please advise ? ?Best contact number: [phone] ?

## 2021-09-10 NOTE — Addendum Note (Signed)
Addended by: Felipa Emory on: 09/10/2021 01:45 PM ? ? Modules accepted: Orders ? ?

## 2021-10-13 ENCOUNTER — Other Ambulatory Visit: Payer: Self-pay | Admitting: Internal Medicine

## 2021-10-13 DIAGNOSIS — I119 Hypertensive heart disease without heart failure: Secondary | ICD-10-CM

## 2021-10-13 DIAGNOSIS — I10 Essential (primary) hypertension: Secondary | ICD-10-CM

## 2021-10-19 ENCOUNTER — Ambulatory Visit: Payer: BC Managed Care – PPO | Admitting: Orthopaedic Surgery

## 2021-10-19 ENCOUNTER — Ambulatory Visit (INDEPENDENT_AMBULATORY_CARE_PROVIDER_SITE_OTHER): Payer: BC Managed Care – PPO

## 2021-10-19 DIAGNOSIS — Z96642 Presence of left artificial hip joint: Secondary | ICD-10-CM | POA: Diagnosis not present

## 2021-10-19 NOTE — Progress Notes (Signed)
The patient is well-known to me.  We replaced her left hip in June of last year.  We replaced her right hip in 2019.  She is getting around well in her left hip but felt like there was a twitch type of feeling recently.  She will make sure her hip is doing well.  She is walking without assistive device.  She denies any pain at all.  She is walking without a limp.  She gets up on the exam table easily and walks around the exam room easily. ? ?Her left hip moves smoothly and fluidly as is her right hip.  Her leg lengths are equal.  An AP pelvis and lateral of the left hip shows a well-seated total hip arthroplasty with no complicating features. ? ?I gave her reassurance that her hip seems to be doing well.  She can continue to increase her activities as comfort allows.  All questions and concerns were answered and addressed.  Follow-up is as needed. ?

## 2021-10-22 ENCOUNTER — Ambulatory Visit: Payer: BC Managed Care – PPO | Admitting: Allergy

## 2021-10-22 ENCOUNTER — Encounter: Payer: Self-pay | Admitting: Allergy

## 2021-10-22 VITALS — BP 112/72 | HR 59 | Temp 97.4°F | Resp 14 | Ht 62.0 in | Wt 183.4 lb

## 2021-10-22 DIAGNOSIS — J3089 Other allergic rhinitis: Secondary | ICD-10-CM | POA: Diagnosis not present

## 2021-10-22 MED ORDER — FLUTICASONE PROPIONATE 50 MCG/ACT NA SUSP: 2.0000 | Freq: Two times a day (BID) | NASAL | 11 refills | Status: DC | PRN

## 2021-10-22 MED ORDER — LEVOCETIRIZINE DIHYDROCHLORIDE 5 MG PO TABS: ORAL_TABLET | ORAL | 3 refills | Status: DC

## 2021-10-22 NOTE — Progress Notes (Signed)
? ? ?Follow-up Note ? ?RE: [name] MRN: [id number] DOB: June 21, 1961 ?Date of Office Visit: 10/22/2021 ? ? ?History of present illness: ?Sylvia Davies is a 61 y.o. female presenting today for follow-up of non-allergic rhinitis.  She was last seen in the office on 05/08/21 by myself.   ? ?She states she is noticing some changes in her symptoms since last visit and does feel like symptoms are much better.  She feels like the flonase is helping the most.  She also is taking Xyzal daily and does feel like it is helpful 2.  She states she was getting a sore throat with use of combination nasal spray Ryaltris even with appropriate technique thus she stopped use.  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  She does her best to keep her purse sanitized.  She wears a mask. ? ?Review of systems: ?Review of Systems  ?Constitutional: Negative.   ?HENT: Negative.    ?Eyes: Negative.   ?Respiratory: Negative.    ?Cardiovascular: Negative.   ?Gastrointestinal: Negative.   ?Musculoskeletal: Negative.   ?Skin: Negative.   ?Allergic/Immunologic: Negative.   ?Neurological: Negative.    ? ?All other systems negative unless noted above in HPI ? ?Past medical/social/surgical/family history have been reviewed and are unchanged unless specifically indicated below. ? ?No changes ? ?Medication List: ?Current Outpatient Medications  ?Medication Sig Dispense Refill  ? acetaminophen (TYLENOL) 650 MG CR tablet Take 1,300 mg by mouth every 8 (eight) hours as needed for pain.    ? amLODipine (NORVASC) 5 MG tablet Take 1 tablet (5 mg total) by mouth daily. 90 tablet 0  ? aspirin 81 MG chewable tablet Chew 1 tablet (81 mg total) by mouth 2 (two) times daily. 30 tablet 0  ? cholecalciferol (VITAMIN D3) 25 MCG (1000 UT) tablet Take 1,000 Units by mouth daily.    ? fluticasone (FLONASE) 50 MCG/ACT nasal spray Place 2 sprays into both nostrils 2 (two) times daily as needed for  allergies or rhinitis. 16 g 5  ? hydrochlorothiazide (HYDRODIURIL) 25 MG tablet Take 1 tablet (25 mg total) by mouth daily. 90 tablet 1  ? levocetirizine (XYZAL) 5 MG tablet TAKE 1 TABLET BY MOUTH EVERY DAY IN THE EVENING 90 tablet 1  ? methocarbamol (ROBAXIN) 500 MG tablet Take 1 tablet (500 mg total) by mouth every 8 (eight) hours as needed for muscle spasms. 60 tablet 2  ? Multiple Vitamins-Minerals (MULTIVITAMIN PO) Take 1 tablet by mouth daily.    ? pravastatin (PRAVACHOL) 20 MG tablet Take 1 tablet (20 mg total) by mouth daily. 90 tablet 3  ? vitamin B-12 (CYANOCOBALAMIN) 1000 MCG tablet Take 1,000 mcg by mouth daily.    ? ?No current facility-administered medications for this visit.  ?  ? ?Known medication allergies: ?Allergies  ?Allergen Reactions  ? Tramadol Hives  ? Amlodipine Besy-Benazepril Hcl Itching  ?  REACTION:sore throat  ? Penicillins   ?  Has patient had a PCN reaction causing immediate rash, facial/tongue/throat swelling, SOB or lightheadedness with hypotension: no ?Has patient had a PCN reaction causing severe rash involving mucus membranes or skin necrosis: no ?Has patient had a PCN reaction that required hospitalization no ?Has patient had a PCN reaction occurring within the last 10 years: yes ?If all of the above answers are "NO", then may proceed with Cephalosporin use. ?  ? Cleocin [Clindamycin Hcl] Itching  ? Hyoscyamine Other (See Comments)  ?  Intolerance ?  ? Zylet [Loteprednol-Tobramycin] Itching  ? ? ? ?Physical examination: ?Blood pressure 112/72, pulse (!) 59, temperature (!) 97.4 ?F (36.3 ?C), resp. rate 14, height '5\' 2"'$  (1.575 m), weight 183 lb 6.4 oz (83.2 kg), last menstrual period 07/31/2013, SpO2 98 %. ? ?General: Alert, interactive, in no acute distress. ?HEENT: PERRLA, TMs pearly gray, turbinates non-edematous without discharge, post-pharynx non erythematous. ?Neck: Supple without lymphadenopathy. ?Lungs: Clear to auscultation without wheezing, rhonchi or rales. {no  increased work of breathing. ?CV: Normal S1, S2 without murmurs. ?Abdomen: Nondistended, nontender. ?Skin: Warm and dry, without lesions or rashes. ?Extremities:  No clubbing, cyanosis or edema. ?Neuro:   Grossly intact. ? ?Diagnositics/Labs: ?Labs:  ?Component ?    Latest Ref Rng 05/08/2021  ?IgE (Immunoglobulin E), Serum ?    6 - 495 IU/mL 6   ?D Pteronyssinus IgE ?    Class 0 kU/L <0.10   ?D Farinae IgE ?    Class 0 kU/L <0.10   ?Cat Dander IgE ?    Class 0 kU/L <0.10   ?Dog Dander IgE ?    Class 0 kU/L <0.10   ?[place] Grass IgE ?    Class 0 kU/L <0.10   ?Timothy Grass IgE ?    Class 0 kU/L <0.10   ?Johnson Grass IgE ?    Class 0 kU/L <0.10   ?Cockroach, [place] IgE ?    Class 0 kU/L <0.10   ?Penicillium Chrysogen IgE ?    Class 0 kU/L <0.10   ?Cladosporium Herbarum IgE ?    Class 0 kU/L <0.10   ?Aspergillus Fumigatus IgE ?    Class 0 kU/L <0.10   ?Alternaria Alternata IgE ?    Class 0 kU/L <0.10   ?Maple/Box Elder IgE ?    Class 0 kU/L <0.10   ?Common Silver Wendee Copp IgE ?    Class 0 kU/L <0.10   ?[place] IgE ?    Class 0 kU/L <0.10   ?Oak, [place] IgE ?    Class 0 kU/L <0.10   ?Elm, American IgE ?    Class 0 kU/L <0.10   ?Cottonwood IgE ?    Class 0 kU/L <0.10   ?Pecan, Hickory IgE ?    Class 0 kU/L <0.10   ?White Mulberry IgE ?    Class 0 kU/L <0.10   ?Ragweed, Short IgE ?    Class 0 kU/L <0.10   ?Pigweed, Rough IgE ?    Class 0 kU/L <0.10   ?Sheep Sorrel IgE Qn ?    Class 0 kU/L <0.10   ?Mouse Urine IgE ?    Class 0 kU/L <0.10   ? ?Assessment and plan: ?  ?Non-allergic rhinitis ?-environmental allergy testing both skin testing and blood testing was negative/non-reactive ?-continue Xyzal '5mg'$  daily as long as it remains effective ?-continue Flonase 2 sprays each nostril daily for 1-2 weeks at a time before stopping once nasal congestion improves for maximum benefit ?-let us know if above regimen becomes ineffective then will consider option of Singulair ? ?Follow-up in 12 months or sooner if needed ? ?I  appreciate the opportunity to take part in Iza's care. Please do not hesitate to contact me with questions. ? ?Sincerely, ? ? ?Prudy Feeler, MD ?Allergy/Immunology ?[facility] ? ? ?

## 2021-10-22 NOTE — Patient Instructions (Addendum)
Non-allergic rhinitis ?-environmental allergy testing both skin testing and blood testing was negative/non-reactive ?-continue Xyzal '5mg'$  daily as long as it remains effective ?-continue Flonase 2 sprays each nostril daily for 1-2 weeks at a time before stopping once nasal congestion improves for maximum benefit ?-let us know if above regimen becomes ineffective then will consider option of Singulair ? ?Follow-up in 12 months or sooner if needed ?

## 2021-11-23 ENCOUNTER — Other Ambulatory Visit: Payer: Self-pay | Admitting: Internal Medicine

## 2021-11-23 DIAGNOSIS — Z1231 Encounter for screening mammogram for malignant neoplasm of breast: Secondary | ICD-10-CM

## 2021-11-30 ENCOUNTER — Other Ambulatory Visit: Payer: Self-pay | Admitting: Internal Medicine

## 2021-11-30 DIAGNOSIS — I10 Essential (primary) hypertension: Secondary | ICD-10-CM

## 2021-11-30 DIAGNOSIS — I119 Hypertensive heart disease without heart failure: Secondary | ICD-10-CM

## 2021-12-01 ENCOUNTER — Telehealth: Payer: Self-pay | Admitting: Internal Medicine

## 2021-12-01 NOTE — Telephone Encounter (Signed)
1.Medication Requested: hydrochlorothiazide (HYDRODIURIL) 25 MG tablet 2. Pharmacy ([place]): [id number] [place]:  [phone] Fax:  [phone]    3. On Med List: yes  4. Last Visit with PCP:  5. Next visit date with PCP:  Pt states medication was rx by another provider, but needs a fill.  Agent: Please be advised that RX refills may take up to 3 business days. We ask that you follow-up with your pharmacy.

## 2021-12-02 NOTE — Telephone Encounter (Signed)
Refill has been sent to the pt's pharmacy  

## 2021-12-15 ENCOUNTER — Other Ambulatory Visit: Payer: Self-pay | Admitting: Internal Medicine

## 2021-12-15 DIAGNOSIS — I10 Essential (primary) hypertension: Secondary | ICD-10-CM

## 2021-12-15 DIAGNOSIS — I119 Hypertensive heart disease without heart failure: Secondary | ICD-10-CM

## 2022-01-06 ENCOUNTER — Other Ambulatory Visit: Payer: Self-pay | Admitting: Obstetrics and Gynecology

## 2022-01-06 DIAGNOSIS — M8580 Other specified disorders of bone density and structure, unspecified site: Secondary | ICD-10-CM

## 2022-01-08 ENCOUNTER — Ambulatory Visit: Payer: BC Managed Care – PPO

## 2022-01-08 ENCOUNTER — Ambulatory Visit
Admission: RE | Admit: 2022-01-08 | Discharge: 2022-01-08 | Disposition: A | Discharge status: Home / Self Care | Payer: BC Managed Care – PPO | Source: Ambulatory Visit | Attending: Internal Medicine | Admitting: Internal Medicine

## 2022-01-08 DIAGNOSIS — Z1231 Encounter for screening mammogram for malignant neoplasm of breast: Secondary | ICD-10-CM

## 2022-02-01 ENCOUNTER — Encounter: Payer: BC Managed Care – PPO | Admitting: Internal Medicine

## 2022-02-05 ENCOUNTER — Encounter: Payer: Self-pay | Admitting: Internal Medicine

## 2022-02-05 ENCOUNTER — Ambulatory Visit (INDEPENDENT_AMBULATORY_CARE_PROVIDER_SITE_OTHER): Payer: BC Managed Care – PPO | Admitting: Internal Medicine

## 2022-02-05 VITALS — BP 118/80 | HR 59 | Resp 18 | Ht 62.0 in | Wt 184.8 lb

## 2022-02-05 DIAGNOSIS — K589 Irritable bowel syndrome without diarrhea: Secondary | ICD-10-CM

## 2022-02-05 DIAGNOSIS — I10 Essential (primary) hypertension: Secondary | ICD-10-CM | POA: Diagnosis not present

## 2022-02-05 DIAGNOSIS — Z0001 Encounter for general adult medical examination with abnormal findings: Secondary | ICD-10-CM

## 2022-02-05 DIAGNOSIS — E782 Mixed hyperlipidemia: Secondary | ICD-10-CM | POA: Diagnosis not present

## 2022-02-05 DIAGNOSIS — R7302 Impaired glucose tolerance (oral): Secondary | ICD-10-CM | POA: Diagnosis not present

## 2022-02-05 DIAGNOSIS — J3089 Other allergic rhinitis: Secondary | ICD-10-CM

## 2022-02-05 LAB — CBC
HCT: 39.9 % (ref 36.0–46.0)
Hemoglobin: 13.4 g/dL (ref 12.0–15.0)
MCHC: 33.4 g/dL (ref 30.0–36.0)
MCV: 81.7 fl (ref 78.0–100.0)
Platelets: 225 10*3/uL (ref 150.0–400.0)
RBC: 4.89 Mil/uL (ref 3.87–5.11)
RDW: 15.6 % — ABNORMAL HIGH (ref 11.5–15.5)
WBC: 4.4 10*3/uL (ref 4.0–10.5)

## 2022-02-05 LAB — LIPID PANEL
Cholesterol: 177 mg/dL (ref 0–200)
HDL: 48.5 mg/dL (ref >39.00)
LDL Cholesterol: 106 mg/dL — ABNORMAL HIGH (ref 0–99)
NonHDL: 128.11
Total CHOL/HDL Ratio: 4
Triglycerides: 113 mg/dL (ref 0.0–149.0)
VLDL: 22.6 mg/dL (ref 0.0–40.0)

## 2022-02-05 LAB — COMPREHENSIVE METABOLIC PANEL
ALT: 13 U/L (ref 0–35)
AST: 18 U/L (ref 0–37)
Albumin: 4.1 g/dL (ref 3.5–5.2)
Alkaline Phosphatase: 100 U/L (ref 39–117)
BUN: 9 mg/dL (ref 6–23)
CO2: 30 mEq/L (ref 19–32)
Calcium: 9.5 mg/dL (ref 8.4–10.5)
Chloride: 103 mEq/L (ref 96–112)
Creatinine, Ser: 0.62 mg/dL (ref 0.40–1.20)
GFR: 95.32 mL/min (ref >60.00)
Glucose, Bld: 97 mg/dL (ref 70–99)
Potassium: 4 mEq/L (ref 3.5–5.1)
Sodium: 140 mEq/L (ref 135–145)
Total Bilirubin: 0.5 mg/dL (ref 0.2–1.2)
Total Protein: 7.5 g/dL (ref 6.0–8.3)

## 2022-02-05 LAB — HEMOGLOBIN A1C: Hgb A1c MFr Bld: 6.1 % (ref 4.6–6.5)

## 2022-02-05 NOTE — Patient Instructions (Addendum)
Try the fodmap diet to help with the stomach.

## 2022-02-05 NOTE — Assessment & Plan Note (Signed)
BP at goal on amlodipine 5 mg daily and hctz 25 mg daily. Checking CMP and adjust as needed.

## 2022-02-05 NOTE — Assessment & Plan Note (Signed)
Continue flonase as needed. Refill as needed.

## 2022-02-05 NOTE — Progress Notes (Signed)
   Subjective:   Patient ID: Sylvia Davies, female    DOB: 1960/11/16, 61 y.o.   MRN: [id number]  HPI The patient is here for physical.  PMH, [facility], social history reviewed and updated  Review of Systems  Constitutional: Negative.   HENT: Negative.    Eyes: Negative.   Respiratory:  Negative for cough, chest tightness and shortness of breath.   Cardiovascular:  Negative for chest pain, palpitations and leg swelling.  Gastrointestinal:  Negative for abdominal distention, abdominal pain, constipation, diarrhea, nausea and vomiting.  Musculoskeletal: Negative.   Skin: Negative.   Neurological: Negative.   Psychiatric/Behavioral: Negative.      Objective:  Physical Exam Constitutional:      Appearance: She is well-developed.  HENT:     Head: Normocephalic and atraumatic.  Cardiovascular:     Rate and Rhythm: Normal rate and regular rhythm.  Pulmonary:     Effort: Pulmonary effort is normal. No respiratory distress.     Breath sounds: Normal breath sounds. No wheezing or rales.  Abdominal:     General: Bowel sounds are normal. There is no distension.     Palpations: Abdomen is soft.     Tenderness: There is no abdominal tenderness. There is no rebound.  Musculoskeletal:     Cervical back: Normal range of motion.  Skin:    General: Skin is warm and dry.  Neurological:     Mental Status: She is alert and oriented to person, place, and time.     Coordination: Coordination normal.     Vitals:   02/05/22 0836  BP: 118/80  Pulse: (!) 59  Resp: 18  SpO2: 98%  Weight: 184 lb 12.8 oz (83.8 kg)  Height: '5\' 2"'$  (1.575 m)    Assessment & Plan:

## 2022-02-05 NOTE — Assessment & Plan Note (Signed)
Flu shot yearly. Covid-19 counseled. Shingrix complete. Tetanus up to date. Colonoscopy up to date. Mammogram up to date, pap smear up to date. Counseled about sun safety and mole surveillance. Counseled about the dangers of distracted driving. Given 10 year screening recommendations.   

## 2022-02-05 NOTE — Assessment & Plan Note (Signed)
Tolerating pravastatin 20 mg daily well and no itching. Checking lipid panel and adjust as needed.

## 2022-02-05 NOTE — Assessment & Plan Note (Signed)
Checking HgA1c today and adjust as needed.

## 2022-02-05 NOTE — Assessment & Plan Note (Signed)
Counseled about fodmap diet to help with gas/bloating.

## 2022-02-09 ENCOUNTER — Telehealth: Payer: Self-pay | Admitting: Internal Medicine

## 2022-02-09 NOTE — Telephone Encounter (Signed)
Pt is returning call to discuss lab results. She would like a callback since she was not available at the time of call. She said she can be reached at [phone].   Please advise.

## 2022-02-23 ENCOUNTER — Other Ambulatory Visit: Payer: BC Managed Care – PPO

## 2022-03-03 ENCOUNTER — Ambulatory Visit: Payer: BC Managed Care – PPO | Admitting: Internal Medicine

## 2022-03-03 ENCOUNTER — Encounter: Payer: Self-pay | Admitting: Internal Medicine

## 2022-03-03 DIAGNOSIS — N9489 Other specified conditions associated with female genital organs and menstrual cycle: Secondary | ICD-10-CM | POA: Insufficient documentation

## 2022-03-03 DIAGNOSIS — E663 Overweight: Secondary | ICD-10-CM | POA: Insufficient documentation

## 2022-03-03 DIAGNOSIS — L299 Pruritus, unspecified: Secondary | ICD-10-CM

## 2022-03-03 DIAGNOSIS — N898 Other specified noninflammatory disorders of vagina: Secondary | ICD-10-CM | POA: Insufficient documentation

## 2022-03-03 DIAGNOSIS — L659 Nonscarring hair loss, unspecified: Secondary | ICD-10-CM | POA: Insufficient documentation

## 2022-03-03 DIAGNOSIS — N950 Postmenopausal bleeding: Secondary | ICD-10-CM | POA: Insufficient documentation

## 2022-03-03 MED ORDER — TRIAMCINOLONE ACETONIDE 0.1 % EX CREA: 1.0000 | TOPICAL_CREAM | Freq: Two times a day (BID) | CUTANEOUS | 0 refills | Status: DC

## 2022-03-03 MED ORDER — PRAVASTATIN SODIUM 10 MG PO TABS: 10.0000 mg | ORAL_TABLET | Freq: Every day | ORAL | 3 refills | Status: DC

## 2022-03-03 NOTE — Patient Instructions (Addendum)
We have sent in a cream to use twice a day on the feet to help the itching.  We will reduce the dose of the pravastatin to 10 mg daily. We have sent in a new prescription.

## 2022-03-03 NOTE — Progress Notes (Unsigned)
   Subjective:   Patient ID: Sylvia Davies, female    DOB: 1960-11-08, 61 y.o.   MRN: [id number]  HPI The patient is a 61 YO female coming in for itching on feet for months since change cholesterol med.  Review of Systems  Constitutional: Negative.   HENT: Negative.    Eyes: Negative.   Respiratory:  Negative for cough, chest tightness and shortness of breath.   Cardiovascular:  Negative for chest pain, palpitations and leg swelling.  Gastrointestinal:  Negative for abdominal distention, abdominal pain, constipation, diarrhea, nausea and vomiting.  Musculoskeletal: Negative.   Skin: Negative.   Neurological: Negative.   Psychiatric/Behavioral: Negative.      Objective:  Physical Exam Constitutional:      Appearance: She is well-developed.  HENT:     Head: Normocephalic and atraumatic.  Cardiovascular:     Rate and Rhythm: Normal rate and regular rhythm.  Pulmonary:     Effort: Pulmonary effort is normal. No respiratory distress.     Breath sounds: Normal breath sounds. No wheezing or rales.  Abdominal:     General: Bowel sounds are normal. There is no distension.     Palpations: Abdomen is soft.     Tenderness: There is no abdominal tenderness. There is no rebound.  Musculoskeletal:     Cervical back: Normal range of motion.  Skin:    General: Skin is warm and dry.  Neurological:     Mental Status: She is alert and oriented to person, place, and time.     Coordination: Coordination normal.     Vitals:   03/03/22 1134  BP: 110/80  Pulse: 60  Temp: 98.6 F (37 C)  SpO2: 98%  Weight: 184 lb (83.5 kg)  Height: '5\' 2"'$  (1.575 m)    Assessment & Plan:

## 2022-03-04 NOTE — Assessment & Plan Note (Signed)
Most likely due to medication. Recent labs from July reviewed and symptoms were present at that time ruling out liver dysfunction, renal dysfunction. Labs are not needed at this time. We will reduce the dose of pravastatin to 10 mg daily to see if this helps. If no improvement we can try wash out period and change to medication.

## 2022-03-20 ENCOUNTER — Other Ambulatory Visit: Payer: Self-pay | Admitting: Internal Medicine

## 2022-03-20 DIAGNOSIS — I119 Hypertensive heart disease without heart failure: Secondary | ICD-10-CM

## 2022-03-20 DIAGNOSIS — I10 Essential (primary) hypertension: Secondary | ICD-10-CM

## 2022-04-05 ENCOUNTER — Encounter: Payer: Self-pay | Admitting: Internal Medicine

## 2022-04-05 ENCOUNTER — Ambulatory Visit: Payer: BC Managed Care – PPO | Admitting: Internal Medicine

## 2022-04-05 DIAGNOSIS — J3089 Other allergic rhinitis: Secondary | ICD-10-CM | POA: Diagnosis not present

## 2022-04-05 MED ORDER — MONTELUKAST SODIUM 10 MG PO TABS: 10.0000 mg | ORAL_TABLET | Freq: Every day | ORAL | 3 refills | Status: DC

## 2022-04-05 NOTE — Progress Notes (Signed)
   Subjective:   Patient ID: Sylvia Davies, female    DOB: [birth year], 61 y.o.   MRN: [id number]  Allergic Reaction Pertinent negatives include no abdominal pain, chest pain, coughing, diarrhea or vomiting.   The patient is a 61 YO female coming in for persistent post nasal drip and throat irritation.  Review of Systems  Constitutional: Negative.   HENT:  Positive for sore throat.   Eyes: Negative.   Respiratory:  Negative for cough, chest tightness and shortness of breath.   Cardiovascular:  Negative for chest pain, palpitations and leg swelling.  Gastrointestinal:  Negative for abdominal distention, abdominal pain, constipation, diarrhea, nausea and vomiting.  Musculoskeletal: Negative.   Skin: Negative.   Neurological: Negative.   Psychiatric/Behavioral: Negative.      Objective:  Physical Exam Constitutional:      Appearance: She is well-developed.  HENT:     Head: Normocephalic and atraumatic.     Mouth/Throat:     Pharynx: Posterior oropharyngeal erythema present.  Cardiovascular:     Rate and Rhythm: Normal rate and regular rhythm.  Pulmonary:     Effort: Pulmonary effort is normal. No respiratory distress.     Breath sounds: Normal breath sounds. No wheezing or rales.  Abdominal:     General: Bowel sounds are normal. There is no distension.     Palpations: Abdomen is soft.     Tenderness: There is no abdominal tenderness. There is no rebound.  Musculoskeletal:     Cervical back: Normal range of motion.  Skin:    General: Skin is warm and dry.  Neurological:     Mental Status: She is alert and oriented to person, place, and time.     Coordination: Coordination normal.     Vitals:   04/05/22 0930  BP: 126/78  Pulse: 74  Temp: 98.1 F (36.7 C)  SpO2: 97%  Weight: 184 lb (83.5 kg)  Height: '5\' 2"'$  (1.575 m)    Assessment & Plan:

## 2022-04-05 NOTE — Assessment & Plan Note (Signed)
With flare recently. Will continue xyzal 5 mg qhs and flonase 2 sprays each nostril daily. Rx for singulair 10 mg qhs which she will add daily. We talked about possible untreated GERD but since she does not have typical symptoms of that today we will try increased treatment for her post nasal drip.

## 2022-04-05 NOTE — Patient Instructions (Signed)
We will add singulair to take in the evening to help the allergies.

## 2022-04-27 ENCOUNTER — Encounter: Payer: Self-pay | Admitting: Internal Medicine

## 2022-04-27 ENCOUNTER — Ambulatory Visit: Payer: BC Managed Care – PPO | Admitting: Internal Medicine

## 2022-04-27 ENCOUNTER — Ambulatory Visit (INDEPENDENT_AMBULATORY_CARE_PROVIDER_SITE_OTHER): Payer: BC Managed Care – PPO

## 2022-04-27 VITALS — BP 138/84 | HR 79 | Temp 98.2°F | Ht 62.0 in | Wt 185.5 lb

## 2022-04-27 DIAGNOSIS — M79605 Pain in left leg: Secondary | ICD-10-CM

## 2022-04-27 NOTE — Assessment & Plan Note (Signed)
Checking x-ray left knee and tib/fib (prior fracture and surgery with hardware) to ensure no new problems. Left hip replacement about 1 year ago.

## 2022-04-27 NOTE — Patient Instructions (Signed)
We will check the x-ray

## 2022-04-27 NOTE — Progress Notes (Signed)
   Subjective:   Patient ID: Sylvia Davies, female    DOB: 02-14-1961, 61 y.o.   MRN: [id number]  HPI The patient is a 61 YO female coming in for leg pain.  Review of Systems  Constitutional: Negative.   HENT: Negative.    Eyes: Negative.   Respiratory:  Negative for cough, chest tightness and shortness of breath.   Cardiovascular:  Negative for chest pain, palpitations and leg swelling.  Gastrointestinal:  Negative for abdominal distention, abdominal pain, constipation, diarrhea, nausea and vomiting.  Musculoskeletal:  Positive for arthralgias.  Skin: Negative.   Neurological: Negative.   Psychiatric/Behavioral: Negative.      Objective:  Physical Exam Constitutional:      Appearance: She is well-developed.  HENT:     Head: Normocephalic and atraumatic.  Cardiovascular:     Rate and Rhythm: Normal rate and regular rhythm.  Pulmonary:     Effort: Pulmonary effort is normal. No respiratory distress.     Breath sounds: Normal breath sounds. No wheezing or rales.  Abdominal:     General: Bowel sounds are normal. There is no distension.     Palpations: Abdomen is soft.     Tenderness: There is no abdominal tenderness. There is no rebound.  Musculoskeletal:        General: Tenderness present.     Cervical back: Normal range of motion.     Comments: Tenderness inferior left knee, no left knee effusion, left PCL/ACL intact  Skin:    General: Skin is warm and dry.  Neurological:     Mental Status: She is alert and oriented to person, place, and time.     Coordination: Coordination normal.     Vitals:   04/27/22 1114  BP: 138/84  Pulse: 79  Temp: 98.2 F (36.8 C)  TempSrc: Oral  SpO2: 99%  Weight: 185 lb 8 oz (84.1 kg)  Height: '5\' 2"'$  (1.575 m)    Assessment & Plan:

## 2022-05-23 ENCOUNTER — Other Ambulatory Visit: Payer: Self-pay | Admitting: Internal Medicine

## 2022-05-23 DIAGNOSIS — I119 Hypertensive heart disease without heart failure: Secondary | ICD-10-CM

## 2022-05-23 DIAGNOSIS — I10 Essential (primary) hypertension: Secondary | ICD-10-CM

## 2022-06-18 ENCOUNTER — Other Ambulatory Visit: Payer: BC Managed Care – PPO

## 2022-06-21 ENCOUNTER — Ambulatory Visit: Payer: BC Managed Care – PPO | Admitting: Internal Medicine

## 2022-06-21 ENCOUNTER — Encounter: Payer: Self-pay | Admitting: Internal Medicine

## 2022-06-21 VITALS — BP 120/78 | HR 63 | Temp 98.6°F | Ht 62.0 in | Wt 181.0 lb

## 2022-06-21 DIAGNOSIS — J069 Acute upper respiratory infection, unspecified: Secondary | ICD-10-CM | POA: Diagnosis not present

## 2022-06-21 LAB — POC COVID19 BINAXNOW: SARS Coronavirus 2 Ag: NEGATIVE

## 2022-06-21 MED ORDER — PROMETHAZINE-DM 6.25-15 MG/5ML PO SYRP: 5.0000 mL | ORAL_SOLUTION | Freq: Four times a day (QID) | ORAL | 0 refills | Status: DC | PRN

## 2022-06-21 MED ORDER — AZITHROMYCIN 250 MG PO TABS: ORAL_TABLET | ORAL | 0 refills | Status: DC

## 2022-06-21 NOTE — Assessment & Plan Note (Addendum)
COVID 19 test (-) Prom cough syr Z pac if worse

## 2022-06-21 NOTE — Progress Notes (Signed)
Subjective:  Patient ID: Sylvia Davies, female    DOB: August 22, 1960  Age: 61 y.o. MRN: [id number]  CC: Cough (And sore throat since wednesday)   HPI Sylvia Davies presents for URI sx's since Fri  Outpatient Medications Prior to Visit  Medication Sig Dispense Refill   acetaminophen (TYLENOL) 650 MG CR tablet Take 1,300 mg by mouth every 8 (eight) hours as needed for pain.     amLODipine (NORVASC) 5 MG tablet TAKE 1 TABLET (5 MG TOTAL) BY MOUTH DAILY. 90 tablet 0   aspirin 81 MG chewable tablet Chew 1 tablet (81 mg total) by mouth 2 (two) times daily. 30 tablet 0   cholecalciferol (VITAMIN D3) 25 MCG (1000 UT) tablet Take 1,000 Units by mouth daily.     fluticasone (FLONASE) 50 MCG/ACT nasal spray Place 2 sprays into both nostrils 2 (two) times daily as needed for allergies or rhinitis. 16 g 11   hydrochlorothiazide (HYDRODIURIL) 25 MG tablet TAKE 1 TABLET (25 MG TOTAL) BY MOUTH DAILY. 90 tablet 1   levocetirizine (XYZAL) 5 MG tablet TAKE 1 TABLET BY MOUTH EVERY DAY IN THE EVENING 90 tablet 3   methocarbamol (ROBAXIN) 500 MG tablet Take 1 tablet (500 mg total) by mouth every 8 (eight) hours as needed for muscle spasms. 60 tablet 2   montelukast (SINGULAIR) 10 MG tablet Take 1 tablet (10 mg total) by mouth at bedtime. 30 tablet 3   Multiple Vitamins-Minerals (MULTIVITAMIN PO) Take 1 tablet by mouth daily.     pravastatin (PRAVACHOL) 10 MG tablet Take 1 tablet (10 mg total) by mouth daily. 90 tablet 3   triamcinolone cream (KENALOG) 0.1 % Apply 1 Application topically 2 (two) times daily. 100 g 0   vitamin B-12 (CYANOCOBALAMIN) 1000 MCG tablet Take 1,000 mcg by mouth daily.     Fluocinolone Acetonide Scalp 0.01 % OIL SMARTSIG:sparingly Topical Twice a Week (Patient not taking: Reported on 06/21/2022)     No facility-administered medications prior to visit.    ROS: Review of Systems  Constitutional:  Positive for fatigue. Negative for activity change, appetite change, chills and  unexpected weight change.  HENT:  Positive for congestion and rhinorrhea. Negative for mouth sores and sinus pressure.   Eyes:  Negative for visual disturbance.  Respiratory:  Positive for cough. Negative for chest tightness.   Gastrointestinal:  Negative for abdominal pain and nausea.  Genitourinary:  Negative for difficulty urinating, frequency and vaginal pain.  Musculoskeletal:  Negative for back pain and gait problem.  Skin:  Negative for pallor and rash.  Neurological:  Negative for dizziness, tremors, weakness, numbness and headaches.  Psychiatric/Behavioral:  Negative for confusion and sleep disturbance.     Objective:  BP 120/78 (BP Location: Left Arm, Patient Position: Sitting, Cuff Size: Normal)   Pulse 63   Temp 98.6 F (37 C) (Oral)   Ht '5\' 2"'$  (1.575 m)   Wt 181 lb (82.1 kg)   LMP 07/31/2013   SpO2 98%   BMI 33.11 kg/m   BP Readings from Last 3 Encounters:  06/21/22 120/78  04/27/22 138/84  04/05/22 126/78    Wt Readings from Last 3 Encounters:  06/21/22 181 lb (82.1 kg)  04/27/22 185 lb 8 oz (84.1 kg)  04/05/22 184 lb (83.5 kg)    Physical Exam Constitutional:      General: She is not in acute distress.    Appearance: She is well-developed.  HENT:     Head: Normocephalic.     Right  Ear: External ear normal.     Left Ear: External ear normal.     Nose: Nose normal.  Eyes:     General:        Right eye: No discharge.        Left eye: No discharge.     Conjunctiva/sclera: Conjunctivae normal.     Pupils: Pupils are equal, round, and reactive to light.  Neck:     Thyroid: No thyromegaly.     Vascular: No JVD.     Trachea: No tracheal deviation.  Cardiovascular:     Rate and Rhythm: Normal rate and regular rhythm.     Heart sounds: Normal heart sounds.  Pulmonary:     Effort: No respiratory distress.     Breath sounds: No stridor. No wheezing.  Abdominal:     General: Bowel sounds are normal. There is no distension.     Palpations: Abdomen is  soft. There is no mass.     Tenderness: There is no abdominal tenderness. There is no guarding or rebound.  Musculoskeletal:        General: No tenderness.     Cervical back: Normal range of motion and neck supple. No rigidity.  Lymphadenopathy:     Cervical: No cervical adenopathy.  Skin:    Findings: No erythema or rash.  Neurological:     Cranial Nerves: No cranial nerve deficit.     Motor: No abnormal muscle tone.     Coordination: Coordination normal.     Deep Tendon Reflexes: Reflexes normal.  Psychiatric:        Behavior: Behavior normal.        Thought Content: Thought content normal.        Judgment: Judgment normal.     Lab Results  Component Value Date   WBC 4.4 02/05/2022   HGB 13.4 02/05/2022   HCT 39.9 02/05/2022   PLT 225.0 02/05/2022   GLUCOSE 97 02/05/2022   CHOL 177 02/05/2022   TRIG 113.0 02/05/2022   HDL 48.50 02/05/2022   LDLDIRECT 144.4 10/16/2012   LDLCALC 106 (H) 02/05/2022   ALT 13 02/05/2022   AST 18 02/05/2022   NA 140 02/05/2022   K 4.0 02/05/2022   CL 103 02/05/2022   CREATININE 0.62 02/05/2022   BUN 9 02/05/2022   CO2 30 02/05/2022   TSH 0.71 01/29/2021   HGBA1C 6.1 02/05/2022    MM 3D SCREEN BREAST BILATERAL  Result Date: 01/11/2022 CLINICAL DATA:  Screening. EXAM: DIGITAL SCREENING BILATERAL MAMMOGRAM WITH TOMOSYNTHESIS AND CAD TECHNIQUE: Bilateral screening digital craniocaudal and mediolateral oblique mammograms were obtained. Bilateral screening digital breast tomosynthesis was performed. The images were evaluated with computer-aided detection. COMPARISON:  Previous exam(s). ACR Breast Density Category c: The breast tissue is heterogeneously dense, which may obscure small masses. FINDINGS: There are no findings suspicious for malignancy. IMPRESSION: No mammographic evidence of malignancy. A result letter of this screening mammogram will be mailed directly to the patient. RECOMMENDATION: Screening mammogram in one year. (Code:SM-B-01Y)  BI-RADS CATEGORY  1: Negative. Electronically Signed   By: Lovey Newcomer M.D.   On: 01/11/2022 09:28    Assessment & Plan:   Problem List Items Addressed This Visit     RESOLVED: Acute upper respiratory infection - Primary    COVID 19 test (-) Prom cough syr Z pac if worse      Relevant Medications   azithromycin (ZITHROMAX Z-PAK) 250 MG tablet   Other Relevant Orders   POC COVID-19 BinaxNow (Completed)  Meds ordered this encounter  Medications   promethazine-dextromethorphan (PROMETHAZINE-DM) 6.25-15 MG/5ML syrup    Sig: Take 5 mLs by mouth 4 (four) times daily as needed for cough.    Dispense:  240 mL    Refill:  0   azithromycin (ZITHROMAX Z-PAK) 250 MG tablet    Sig: As directed    Dispense:  6 tablet    Refill:  0      Follow-up: Return for a follow-up visit.  Walker Kehr, MD

## 2022-06-23 ENCOUNTER — Telehealth: Payer: Self-pay | Admitting: Internal Medicine

## 2022-06-23 MED ORDER — FLUCONAZOLE 150 MG PO TABS: 150.0000 mg | ORAL_TABLET | Freq: Once | ORAL | 1 refills | Status: AC

## 2022-06-23 NOTE — Telephone Encounter (Signed)
Patient was prescribed an antibiotic on Monday by Dr. Alain Marion - she would like to know if a diflucan can be sent in to [facility] on [facility] [address] [facility] in [facility]  Phone # [phone]

## 2022-06-23 NOTE — Telephone Encounter (Signed)
OK - done Thx 

## 2022-06-24 ENCOUNTER — Other Ambulatory Visit: Payer: Self-pay | Admitting: Internal Medicine

## 2022-06-24 DIAGNOSIS — I10 Essential (primary) hypertension: Secondary | ICD-10-CM

## 2022-06-24 DIAGNOSIS — I119 Hypertensive heart disease without heart failure: Secondary | ICD-10-CM

## 2022-06-24 NOTE — Telephone Encounter (Signed)
Notified pt rx sent to [facility]  

## 2022-06-30 ENCOUNTER — Other Ambulatory Visit: Payer: Self-pay | Admitting: Internal Medicine

## 2022-07-02 ENCOUNTER — Other Ambulatory Visit: Payer: Self-pay | Admitting: Internal Medicine

## 2022-07-09 ENCOUNTER — Other Ambulatory Visit: Payer: Self-pay | Admitting: Internal Medicine

## 2022-07-09 ENCOUNTER — Telehealth: Payer: Self-pay

## 2022-07-09 NOTE — Telephone Encounter (Signed)
Pt states she got a notification stating that she has a rx for   pravastatin (PRAVACHOL) 20 MG tablet   to be picked up but has been taking the '10mg'$  and refuses to take the '20mg'$ . She states she does not understand why it was changed and would like a call or MyChart message from the provider to clarify why it was a change?

## 2022-07-09 NOTE — Telephone Encounter (Signed)
She should be taking 10 mg and this was D/C by me in August. Reordered by [facility] today perhaps by accident?

## 2022-07-15 NOTE — Telephone Encounter (Signed)
Patient called and said that a new script needed to be sent in for the 10 mg. She asked for a call back from someone clinical when done

## 2022-07-16 ENCOUNTER — Other Ambulatory Visit: Payer: Self-pay

## 2022-07-16 MED ORDER — PRAVASTATIN SODIUM 10 MG PO TABS: 10.0000 mg | ORAL_TABLET | Freq: Every day | ORAL | 3 refills | Status: DC

## 2022-07-21 ENCOUNTER — Other Ambulatory Visit: Payer: Self-pay | Admitting: Internal Medicine

## 2022-07-21 DIAGNOSIS — I10 Essential (primary) hypertension: Secondary | ICD-10-CM

## 2022-07-21 DIAGNOSIS — I119 Hypertensive heart disease without heart failure: Secondary | ICD-10-CM

## 2022-08-16 ENCOUNTER — Other Ambulatory Visit: Payer: Self-pay | Admitting: Allergy

## 2022-10-05 DIAGNOSIS — R1310 Dysphagia, unspecified: Secondary | ICD-10-CM | POA: Insufficient documentation

## 2022-10-05 DIAGNOSIS — H9193 Unspecified hearing loss, bilateral: Secondary | ICD-10-CM | POA: Insufficient documentation

## 2022-10-06 ENCOUNTER — Encounter: Payer: Self-pay | Admitting: Podiatry

## 2022-10-06 ENCOUNTER — Ambulatory Visit: Payer: BC Managed Care – PPO | Admitting: Podiatry

## 2022-10-06 DIAGNOSIS — M2041 Other hammer toe(s) (acquired), right foot: Secondary | ICD-10-CM

## 2022-10-06 DIAGNOSIS — Q666 Other congenital valgus deformities of feet: Secondary | ICD-10-CM

## 2022-10-06 DIAGNOSIS — M2042 Other hammer toe(s) (acquired), left foot: Secondary | ICD-10-CM | POA: Diagnosis not present

## 2022-10-06 DIAGNOSIS — L84 Corns and callosities: Secondary | ICD-10-CM

## 2022-10-06 NOTE — Progress Notes (Signed)
Subjective:  Patient ID: Sylvia Davies, female    DOB: [birth year],  MRN: [id number]  Chief Complaint  Patient presents with   Callouses    62 y.o. female presents with the above complaint.  Patient presents with follow-up of bilateral hallux IPJ hyperkeratotic lesion secondary to underlying pes planovalgus deformity.  Patient states is doing she is doing a lot better.  She does not have any pain.  The orthotics are functioning well.  No acute concerns.   Review of Systems: Negative except as noted in the HPI. Denies N/V/F/Ch.  Past Medical History:  Diagnosis Date   ALLERGIC RHINITIS 04/02/2007   Allergy    Arthritis    BREAST BIOPSY, HX OF 04/02/2007   Diverticulosis    FIBROCYSTIC BREAST DISEASE, HX OF 04/02/2007   GERD (gastroesophageal reflux disease)    Heart murmur    Hemorrhoids    HYPERLIPIDEMIA 04/02/2007   HYPERTENSION, BENIGN ESSENTIAL 05/04/2007   Impaired glucose tolerance 11/08/2012   Irritable bowel syndrome 04/02/2007   MITRAL REGURGITATION, 0 (MILD) 04/02/2007   OBESITY 04/02/2007    Current Outpatient Medications:    Aspirin-Calcium Carbonate (BAYER WOMENS) 81-777 MG TABS, Take by mouth., Disp: , Rfl:    DIFLUCAN 150 MG tablet, 1 tablet Orally Once, repeat in 3 days if needed, Disp: , Rfl:    acetaminophen (TYLENOL) 650 MG CR tablet, Take 1,300 mg by mouth every 8 (eight) hours as needed for pain., Disp: , Rfl:    amLODipine (NORVASC) 5 MG tablet, TAKE 1 TABLET (5 MG TOTAL) BY MOUTH DAILY., Disp: 90 tablet, Rfl: 0   aspirin 81 MG chewable tablet, Chew 1 tablet (81 mg total) by mouth 2 (two) times daily., Disp: 30 tablet, Rfl: 0   azithromycin (ZITHROMAX Z-PAK) 250 MG tablet, As directed, Disp: 6 tablet, Rfl: 0   cholecalciferol (VITAMIN D3) 25 MCG (1000 UT) tablet, Take 1,000 Units by mouth daily., Disp: , Rfl:    Fluocinolone Acetonide Scalp 0.01 % OIL, SMARTSIG:sparingly Topical Twice a Week (Patient not taking: Reported on 06/21/2022), Disp: , Rfl:     fluticasone (FLONASE) 50 MCG/ACT nasal spray, Place 2 sprays into both nostrils 2 (two) times daily as needed for allergies or rhinitis., Disp: 16 g, Rfl: 11   hydrochlorothiazide (HYDRODIURIL) 25 MG tablet, TAKE 1 TABLET (25 MG TOTAL) BY MOUTH DAILY., Disp: 90 tablet, Rfl: 1   levocetirizine (XYZAL) 5 MG tablet, TAKE 1 TABLET BY MOUTH EVERY DAY IN THE EVENING, Disp: 90 tablet, Rfl: 1   methocarbamol (ROBAXIN) 500 MG tablet, Take 1 tablet (500 mg total) by mouth every 8 (eight) hours as needed for muscle spasms., Disp: 60 tablet, Rfl: 2   montelukast (SINGULAIR) 10 MG tablet, TAKE 1 TABLET BY MOUTH EVERYDAY AT BEDTIME, Disp: 90 tablet, Rfl: 1   Multiple Vitamins-Minerals (MULTIVITAMIN PO), Take 1 tablet by mouth daily., Disp: , Rfl:    pravastatin (PRAVACHOL) 10 MG tablet, Take 1 tablet (10 mg total) by mouth daily., Disp: 90 tablet, Rfl: 3   promethazine-dextromethorphan (PROMETHAZINE-DM) 6.25-15 MG/5ML syrup, TAKE 5 MLS BY MOUTH 4 TIMES A DAY AS NEEDED FOR COUGH, Disp: 240 mL, Rfl: 0   triamcinolone cream (KENALOG) 0.1 %, APPLY TO AFFECTED AREA TWICE A DAY, Disp: 90 g, Rfl: 0   vitamin B-12 (CYANOCOBALAMIN) 1000 MCG tablet, Take 1,000 mcg by mouth daily., Disp: , Rfl:   Social History   Tobacco Use  Smoking Status Never   Passive exposure: Never  Smokeless Tobacco Never    Allergies  Allergen Reactions   Tramadol Hives   Amlodipine Besy-Benazepril Hcl Itching    REACTION:sore throat   Amlodipine Besy-Benazepril Hcl Itching    REACTION:sore throat   Benazepril Hcl Other (See Comments)   Penicillins     Has patient had a PCN reaction causing immediate rash, facial/tongue/throat swelling, SOB or lightheadedness with hypotension: no Has patient had a PCN reaction causing severe rash involving mucus membranes or skin necrosis: no Has patient had a PCN reaction that required hospitalization no Has patient had a PCN reaction occurring within the last 10 years: yes If all of the above  answers are "NO", then may proceed with Cephalosporin use.    Cleocin [Clindamycin Hcl] Itching   Hyoscyamine Other (See Comments)    Intolerance    Zylet [Loteprednol-Tobramycin] Itching   Objective:  There were no vitals filed for this visit. There is no height or weight on file to calculate BMI. Constitutional Well developed. Well nourished.  Vascular Dorsalis pedis pulses palpable bilaterally. Posterior tibial pulses palpable bilaterally. Capillary refill normal to all digits.  No cyanosis or clubbing noted. Pedal hair growth normal.  Neurologic Normal speech. Oriented to person, place, and time. Epicritic sensation to light touch grossly present bilaterally.  Dermatologic Nails well groomed and normal in appearance. No open wounds. No skin lesions.  Orthopedic:  No pain on palpation to the hyperkeratotic lesion to bilateral hallux IPJ.  Gait examination shows calcaneal valgus that is somewhat flexible in nature.  Partially able to recruit the arch.  Patient is able to do single and double heel raises with calcaneus returning to neutral position.  Mild to many toe signs noted.   Radiographs: None Assessment:   1. Corns and callosities   2. Hammer toes of both feet    Plan:  Patient was evaluated and treated and all questions answered.  Bilateral hallux IPJ calluses secondary to underlying pes planovalgus deformity -Clinically her pain has resolved with underlying hallux IPJ calluses.  I discussed with the patient that if this does recur in the future we can discuss a more conservative treatment option as well as surgical treatment option to prevent the recurrence of the callus formation.  For now I believe patient will continue benefit from orthotics management.  Also discussed with her shoe gear modification as well.  Patient states understanding. -Patient obtain orthotics and seems to be functioning really well.  She states that they seem to be improving the mechanics of  her walking.  Overall no acute complaints.  No follow-ups on file.

## 2022-10-11 ENCOUNTER — Ambulatory Visit: Payer: BC Managed Care – PPO | Admitting: Internal Medicine

## 2022-10-11 ENCOUNTER — Other Ambulatory Visit: Payer: Self-pay

## 2022-10-11 ENCOUNTER — Encounter: Payer: Self-pay | Admitting: Internal Medicine

## 2022-10-11 VITALS — BP 122/78 | HR 72 | Temp 97.7°F | Resp 18 | Ht 62.0 in | Wt 181.0 lb

## 2022-10-11 DIAGNOSIS — J310 Chronic rhinitis: Secondary | ICD-10-CM | POA: Diagnosis not present

## 2022-10-11 MED ORDER — FEXOFENADINE HCL 180 MG PO TABS
180.0000 mg | ORAL_TABLET | Freq: Every day | ORAL | 11 refills | Status: DC
Start: 2022-10-11 — End: 2022-11-19

## 2022-10-11 MED ORDER — FLUTICASONE PROPIONATE 50 MCG/ACT NA SUSP: 2.0000 | Freq: Every day | NASAL | 11 refills | Status: DC

## 2022-10-11 MED ORDER — AZELASTINE HCL 0.1 % NA SOLN: 1.0000 | Freq: Two times a day (BID) | NASAL | 11 refills | Status: DC | PRN

## 2022-10-11 NOTE — Patient Instructions (Addendum)
Non-allergic rhinitis -Environmental allergy testing 04/2021 both skin testing and blood testing was negative/non-reactive - Use nasal saline rinses before nose sprays such as with Neilmed Sinus Rinse.  Use distilled water.   - Use Flonase 2 sprays each nostril daily. Aim upward and outward. - Use Azelastine 1-2 sprays each nostril twice daily as needed for runny nose, sneezing, drainage, congestion. Aim upward and outward. - Use Xyzal 5mg  daily. If Xyzal makes you sleepy, use Allegra 180mg  daily. - Stop Singulair due to side effects- insomnia.
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
## 2022-11-05 ENCOUNTER — Telehealth: Payer: Self-pay

## 2022-11-05 NOTE — Telephone Encounter (Signed)
Pt has stated she was seen by ENT and wasn't told anything was wrong with her throat. However, pt states she is still having these strangling choking like spells when swallowing. Pt wanted provider to look over ENT notes from her visit on 10/05/2022.  **Pt has made an appointment for 11/18/2022 to talk with provider about above episodes.

## 2022-11-16 ENCOUNTER — Other Ambulatory Visit: Payer: Self-pay | Admitting: Internal Medicine

## 2022-11-16 DIAGNOSIS — I119 Hypertensive heart disease without heart failure: Secondary | ICD-10-CM

## 2022-11-16 DIAGNOSIS — I10 Essential (primary) hypertension: Secondary | ICD-10-CM

## 2022-11-18 ENCOUNTER — Telehealth: Payer: Self-pay | Admitting: Internal Medicine

## 2022-11-18 ENCOUNTER — Encounter: Payer: Self-pay | Admitting: Internal Medicine

## 2022-11-18 ENCOUNTER — Ambulatory Visit: Payer: BC Managed Care – PPO | Admitting: Internal Medicine

## 2022-11-18 VITALS — BP 122/84 | HR 64 | Temp 98.1°F | Ht 62.0 in | Wt 181.0 lb

## 2022-11-18 DIAGNOSIS — R1319 Other dysphagia: Secondary | ICD-10-CM | POA: Diagnosis not present

## 2022-11-18 MED ORDER — PANTOPRAZOLE SODIUM 40 MG PO TBEC
40.0000 mg | DELAYED_RELEASE_TABLET | Freq: Every day | ORAL | 3 refills | Status: DC
Start: 2022-11-18 — End: 2023-02-14

## 2022-11-18 NOTE — Telephone Encounter (Signed)
Is it ok to change to liquid? Please advise to dosage?

## 2022-11-18 NOTE — Assessment & Plan Note (Signed)
She describes only certain foods getting stuck. Initially stated this blocked airway but describes this going on for 5-10 minutes and not being able to breathe entire time so suspect just sensation of air not getting through versus airway blockage. We discussed if airway was blocked for 5-10 minutes she would pass out. ENT did laryngoscopy and saw mild edema. Starting on PPI protonix 40 mg daily and referral for swallow study. Advised to avoid solid foods for now and chew bites well and use sips of water to help food go down.

## 2022-11-18 NOTE — Progress Notes (Signed)
   Subjective:   Patient ID: Sylvia Davies, female    DOB: [birth year], 62 y.o.   MRN: [id number]  HPI The patient is a 62 YO female coming in for swallowing problems. Started 3 months ago. Rare episodes initially and now about once per week. Saw ENT and they did laryngoscopy and did see mild edema cord.   Review of Systems  Constitutional: Negative.   HENT:  Positive for trouble swallowing.   Eyes: Negative.   Respiratory:  Negative for cough, chest tightness and shortness of breath.   Cardiovascular:  Negative for chest pain, palpitations and leg swelling.  Gastrointestinal:  Negative for abdominal distention, abdominal pain, constipation, diarrhea, nausea and vomiting.  Musculoskeletal: Negative.   Skin: Negative.   Neurological: Negative.   Psychiatric/Behavioral: Negative.      Objective:  Physical Exam Constitutional:      Appearance: She is well-developed.  HENT:     Head: Normocephalic and atraumatic.  Cardiovascular:     Rate and Rhythm: Normal rate and regular rhythm.  Pulmonary:     Effort: Pulmonary effort is normal. No respiratory distress.     Breath sounds: Normal breath sounds. No wheezing or rales.  Abdominal:     General: Bowel sounds are normal. There is no distension.     Palpations: Abdomen is soft.     Tenderness: There is no abdominal tenderness. There is no rebound.  Musculoskeletal:     Cervical back: Normal range of motion.  Skin:    General: Skin is warm and dry.  Neurological:     Mental Status: She is alert and oriented to person, place, and time.     Coordination: Coordination normal.     Vitals:   11/18/22 1109  BP: 122/84  Pulse: 64  Temp: 98.1 F (36.7 C)  TempSrc: Oral  SpO2: 98%  Weight: 181 lb (82.1 kg)  Height: 5\' 2"  (1.575 m)    Assessment & Plan:

## 2022-11-18 NOTE — Patient Instructions (Signed)
We will get the swallowing study and will start you on an acid medicine in the meantime called protonix. Take 1 pill daily before 1st meal of the day.

## 2022-11-18 NOTE — Telephone Encounter (Signed)
Patient called and said that she need to have the Fexofenadine hci 180mg  change to a liquid. She can not swallow those big pills. [facility] [phone]

## 2022-11-19 ENCOUNTER — Other Ambulatory Visit: Payer: Self-pay | Admitting: Internal Medicine

## 2022-11-19 ENCOUNTER — Other Ambulatory Visit (HOSPITAL_COMMUNITY): Payer: Self-pay

## 2022-11-19 DIAGNOSIS — R1310 Dysphagia, unspecified: Secondary | ICD-10-CM

## 2022-11-19 MED ORDER — FEXOFENADINE HCL 30 MG/5ML PO SUSP: 180.0000 mg | Freq: Every day | ORAL | 5 refills | Status: DC

## 2022-11-19 NOTE — Telephone Encounter (Signed)
Called and informed patient of prescription being sent in. Patient verbalized understanding.

## 2022-11-19 NOTE — Progress Notes (Signed)
Switched to Allegra suspension, trouble with swallowing large pills.

## 2022-11-20 ENCOUNTER — Other Ambulatory Visit: Payer: Self-pay | Admitting: Internal Medicine

## 2022-11-26 ENCOUNTER — Other Ambulatory Visit: Payer: Self-pay | Admitting: Internal Medicine

## 2022-11-26 ENCOUNTER — Other Ambulatory Visit: Payer: Self-pay

## 2022-11-26 DIAGNOSIS — Z1231 Encounter for screening mammogram for malignant neoplasm of breast: Secondary | ICD-10-CM

## 2022-12-12 ENCOUNTER — Encounter (HOSPITAL_COMMUNITY): Payer: Self-pay

## 2022-12-12 ENCOUNTER — Ambulatory Visit (HOSPITAL_COMMUNITY)
Admission: EM | Admit: 2022-12-12 | Discharge: 2022-12-12 | Disposition: A | Discharge status: Home / Self Care | Payer: BC Managed Care – PPO | Attending: Family Medicine | Admitting: Family Medicine

## 2022-12-12 DIAGNOSIS — W57XXXA Bitten or stung by nonvenomous insect and other nonvenomous arthropods, initial encounter: Secondary | ICD-10-CM

## 2022-12-12 DIAGNOSIS — S20462A Insect bite (nonvenomous) of left back wall of thorax, initial encounter: Secondary | ICD-10-CM | POA: Diagnosis not present

## 2022-12-12 DIAGNOSIS — L03312 Cellulitis of back [any part except buttock]: Secondary | ICD-10-CM

## 2022-12-12 MED ORDER — DOXYCYCLINE HYCLATE 100 MG PO CAPS: 100.0000 mg | ORAL_CAPSULE | Freq: Two times a day (BID) | ORAL | 0 refills | Status: AC

## 2022-12-12 NOTE — ED Provider Notes (Signed)
[facility]    CSN: [id number] Arrival date & time: 12/12/22  1317      History   Chief Complaint Chief Complaint  Patient presents with   Tick Removal    HPI Sylvia Davies is a 62 y.o. female.   HPI Here for a tick bite noted on her left mid back.  She developed some itching and a bump on her left back for a few days.  Family members had told her it was just developed.  Then today her cousin looked at it and saw the tick and removed it.  No fever or chills  She is allergic to penicillin and clindamycin  Past Medical History:  Diagnosis Date   ALLERGIC RHINITIS 04/02/2007   Allergy    Arthritis    BREAST BIOPSY, HX OF 04/02/2007   Diverticulosis    FIBROCYSTIC BREAST DISEASE, HX OF 04/02/2007   GERD (gastroesophageal reflux disease)    Heart murmur    Hemorrhoids    HYPERLIPIDEMIA 04/02/2007   HYPERTENSION, BENIGN ESSENTIAL 05/04/2007   Impaired glucose tolerance 11/08/2012   Irritable bowel syndrome 04/02/2007   MITRAL REGURGITATION, 0 (MILD) 04/02/2007   OBESITY 04/02/2007    Patient Active Problem List   Diagnosis Date Noted   Bilateral hearing loss 10/05/2022   Dysphagia 10/05/2022   Alopecia 03/03/2022   Postmenopausal bleeding 03/03/2022   Other specified conditions associated with female genital organs and menstrual cycle 03/03/2022   Overweight 03/03/2022   Vaginal dryness 03/03/2022   LVH (left ventricular hypertrophy) due to hypertensive disease, without heart failure 01/30/2021   Encounter for general adult medical examination with abnormal findings 01/29/2021   Left leg pain 04/11/2013   Hemorrhoids 12/20/2012   Impaired glucose tolerance 11/08/2012   Pruritus 09/26/2008   HYPERTENSION, BENIGN ESSENTIAL 05/04/2007   Hyperlipidemia 04/02/2007   OBESITY 04/02/2007   MITRAL REGURGITATION, 0 (MILD) 04/02/2007   Allergic rhinitis 04/02/2007   GERD 04/02/2007   Irritable bowel syndrome 04/02/2007   LOW BACK PAIN 04/02/2007    FIBROCYSTIC BREAST DISEASE, HX OF 04/02/2007    Past Surgical History:  Procedure Laterality Date   BREAST BIOPSY Left    BREAST EXCISIONAL BIOPSY Left    benign   COLONOSCOPY     DG 5TH DIGIT LEFT FOOT Left    DILATATION & CURETTAGE/HYSTEROSCOPY WITH MYOSURE N/A 06/02/2016   Procedure: DILATATION & CURETTAGE/HYSTEROSCOPY WITH MYOSURE;  Surgeon: Geryl Rankins, MD;  Location: [facility];  Service: Gynecology;  Laterality: N/A;  Myosure - Endometrial Mass   EYE SURGERY Left    tear duct   JOINT REPLACEMENT     Right total hip Dr. Gayla Doss 04-28-18   LEG SURGERY Left    have rods and srcews   TOTAL HIP ARTHROPLASTY Right 04/28/2018   Procedure: RIGHT TOTAL HIP ARTHROPLASTY ANTERIOR APPROACH;  Surgeon: Kathryne Hitch, MD;  Location: [facility];  Service: Orthopedics;  Laterality: Right;   TOTAL HIP ARTHROPLASTY Left 01/02/2021   Procedure: LEFT TOTAL HIP ARTHROPLASTY ANTERIOR APPROACH;  Surgeon: Kathryne Hitch, MD;  Location: [facility];  Service: Orthopedics;  Laterality: Left;    OB History   No obstetric history on file.      Home Medications    Prior to Admission medications   Medication Sig Start Date End Date Taking? Authorizing Provider  amLODipine (NORVASC) 5 MG tablet TAKE 1 TABLET (5 MG TOTAL) BY MOUTH DAILY. 07/21/22  Yes Myrlene Broker, MD  aspirin 81 MG chewable tablet Chew 1 tablet (81 mg  total) by mouth 2 (two) times daily. 01/03/21  Yes Kathryne Hitch, MD  Aspirin-Calcium Carbonate (BAYER WOMENS) [phone] MG TABS Take by mouth. 02/22/17  Yes [provider]  cholecalciferol (VITAMIN D3) 25 MCG (1000 UT) tablet Take 1,000 Units by mouth daily.   Yes [provider]  doxycycline (VIBRAMYCIN) 100 MG capsule Take 1 capsule (100 mg total) by mouth 2 (two) times daily for 7 days. 12/12/22 12/19/22 Yes Zanaya Baize, Janace Aris, MD  fexofenadine (ALLEGRA) 30 MG/5ML suspension Take 30 mLs (180 mg total) by mouth daily. 11/19/22  Yes Birder Robson,  MD  hydrochlorothiazide (HYDRODIURIL) 25 MG tablet Take 1 tablet (25 mg total) by mouth daily. Annual appt due in July must see provider for future refills 11/16/22  Yes Myrlene Broker, MD  levocetirizine (XYZAL) 5 MG tablet TAKE 1 TABLET BY MOUTH EVERY DAY IN THE EVENING 08/16/22  Yes Padgett, Pilar Grammes, MD  Multiple Vitamins-Minerals (MULTIVITAMIN PO) Take 1 tablet by mouth daily.   Yes [provider]  pantoprazole (PROTONIX) 40 MG tablet Take 1 tablet (40 mg total) by mouth daily. 11/18/22  Yes Myrlene Broker, MD  pravastatin (PRAVACHOL) 10 MG tablet Take 1 tablet (10 mg total) by mouth daily. 07/16/22  Yes Myrlene Broker, MD  vitamin B-12 (CYANOCOBALAMIN) 1000 MCG tablet Take 1,000 mcg by mouth daily.   Yes [provider]  acetaminophen (TYLENOL) 650 MG CR tablet Take 1,300 mg by mouth every 8 (eight) hours as needed for pain.    [provider]  azelastine (ASTELIN) 0.1 % nasal spray Place 1 spray into both nostrils 2 (two) times daily as needed for rhinitis. Use in each nostril as directed 10/11/22   Birder Robson, MD  Fluocinolone Acetonide Scalp 0.01 % OIL  02/16/22   [provider]  fluticasone (FLONASE) 50 MCG/ACT nasal spray Place 2 sprays into both nostrils daily. 10/11/22   Birder Robson, MD  methocarbamol (ROBAXIN) 500 MG tablet Take 1 tablet (500 mg total) by mouth every 8 (eight) hours as needed for muscle spasms. 08/31/21   Kathryne Hitch, MD  montelukast (SINGULAIR) 10 MG tablet TAKE 1 TABLET BY MOUTH EVERYDAY AT BEDTIME 11/22/22   Myrlene Broker, MD  gabapentin (NEURONTIN) 100 MG capsule Take 1-3 capsules (100-300 mg total) by mouth at bedtime as needed (nerve pain). Patient not taking: No sig reported 10/03/20 01/02/21  Rodolph Bong, MD    Family History Family History  Problem Relation Age of Onset   Hypertension Mother    Diabetes Maternal Grandmother    Colon cancer Neg Hx    Colon polyps Neg Hx     Esophageal cancer Neg Hx    Rectal cancer Neg Hx    Stomach cancer Neg Hx     Social History Social History   Tobacco Use   Smoking status: Never    Passive exposure: Never   Smokeless tobacco: Never  Vaping Use   Vaping Use: Never used  Substance Use Topics   Alcohol use: Never   Drug use: Never     Allergies   Tramadol, Amlodipine besy-benazepril hcl, Amlodipine besy-benazepril hcl, Benazepril hcl, Penicillins, Cleocin [clindamycin hcl], Hyoscyamine, and Zylet [loteprednol-tobramycin]   Review of Systems Review of Systems   Physical Exam Triage Vital Signs ED Triage Vitals [12/12/22 1430]  Enc Vitals Group     BP (!) 142/89     Pulse Rate (!) 107     Resp 18  Temp 98 F (36.7 C)     Temp Source Oral     SpO2 96 %     Weight      Height      Head Circumference      Peak Flow      Pain Score      Pain Loc      Pain Edu?      Excl. in GC?    No data found.  Updated Vital Signs BP (!) 142/89 (BP Location: Left Arm)   Pulse (!) 107   Temp 98 F (36.7 C) (Oral)   Resp 18   LMP 07/31/2013   SpO2 96%   Visual Acuity Right Eye Distance:   Left Eye Distance:   Bilateral Distance:    Right Eye Near:   Left Eye Near:    Bilateral Near:     Physical Exam Vitals reviewed.  Constitutional:      General: She is not in acute distress.    Appearance: She is not ill-appearing, toxic-appearing or diaphoretic.  Pulmonary:     Effort: Pulmonary effort is normal.     Breath sounds: Normal breath sounds.  Skin:    Coloration: Skin is not pale.     Comments: There is an erythematous indurated bump about 1 cm in diameter on her left mid back just under her bra.  There is no fluctuance.  There is no foreign body  Neurological:     Mental Status: She is alert and oriented to person, place, and time.  Psychiatric:        Behavior: Behavior normal.      UC Treatments / Results  Labs (all labs ordered are listed, but only abnormal results are  displayed) Labs Reviewed - No data to display  EKG   Radiology No results found.  Procedures Procedures (including critical care time)  Medications Ordered in UC Medications - No data to display  Initial Impression / Assessment and Plan / UC Course  I have reviewed the triage vital signs and the nursing notes.  Pertinent labs & imaging results that were available during my care of the patient were reviewed by me and considered in my medical decision making (see chart for details).        Doxycycline is sent into the pharmacy. Final Clinical Impressions(s) / UC Diagnoses   Final diagnoses:  Cellulitis of back except buttock  Tick bite of left back wall of thorax, initial encounter     Discharge Instructions      Take doxycycline 100 mg --1 capsule 2 times daily for 7 days\      ED Prescriptions     Medication Sig Dispense Auth. Provider   doxycycline (VIBRAMYCIN) 100 MG capsule Take 1 capsule (100 mg total) by mouth 2 (two) times daily for 7 days. 14 capsule Marlinda Mike, Janace Aris, MD      PDMP not reviewed this encounter.   Zenia Resides, MD 12/12/22 [phone]

## 2022-12-12 NOTE — Discharge Instructions (Signed)
Take doxycycline 100 mg--1 capsule 2 times daily for 7 days 

## 2022-12-12 NOTE — ED Triage Notes (Signed)
Pt presents with a tick bite on her back. Pt noticed the tick bite today.

## 2022-12-16 ENCOUNTER — Other Ambulatory Visit: Payer: Self-pay | Admitting: Internal Medicine

## 2022-12-16 DIAGNOSIS — R1319 Other dysphagia: Secondary | ICD-10-CM

## 2022-12-24 ENCOUNTER — Encounter (HOSPITAL_COMMUNITY): Payer: BC Managed Care – PPO

## 2022-12-24 ENCOUNTER — Ambulatory Visit (HOSPITAL_COMMUNITY): Payer: BC Managed Care – PPO

## 2022-12-24 ENCOUNTER — Other Ambulatory Visit (HOSPITAL_COMMUNITY): Payer: BC Managed Care – PPO

## 2022-12-25 ENCOUNTER — Other Ambulatory Visit: Payer: Self-pay | Admitting: Internal Medicine

## 2022-12-27 ENCOUNTER — Other Ambulatory Visit: Payer: Self-pay | Admitting: Internal Medicine

## 2022-12-29 ENCOUNTER — Other Ambulatory Visit: Payer: Self-pay | Admitting: Internal Medicine

## 2022-12-31 ENCOUNTER — Ambulatory Visit (HOSPITAL_COMMUNITY)
Admission: RE | Admit: 2022-12-31 | Discharge: 2022-12-31 | Disposition: A | Discharge status: Home / Self Care | Payer: BC Managed Care – PPO | Source: Ambulatory Visit | Attending: Internal Medicine | Admitting: Internal Medicine

## 2022-12-31 DIAGNOSIS — R1310 Dysphagia, unspecified: Secondary | ICD-10-CM | POA: Diagnosis present

## 2022-12-31 DIAGNOSIS — R1319 Other dysphagia: Secondary | ICD-10-CM | POA: Diagnosis not present

## 2023-01-03 ENCOUNTER — Telehealth: Payer: Self-pay | Admitting: Internal Medicine

## 2023-01-03 DIAGNOSIS — R1319 Other dysphagia: Secondary | ICD-10-CM

## 2023-01-03 NOTE — Telephone Encounter (Signed)
We have done referral, typically she may talk to an APP at the first visit and then Dr. Marina Goodell would do her procedure if needed.

## 2023-01-03 NOTE — Telephone Encounter (Signed)
Patient saw Dr. Frutoso Chase recommendation about seeing a GI doctor. She wanted to know if she can be referred to Dr. Marina Goodell at [facility]. Best callback is [phone]. She said her best availability is after 1:00 pm for a call.

## 2023-01-06 NOTE — Telephone Encounter (Signed)
Called patient and LVM twice

## 2023-01-07 ENCOUNTER — Encounter: Payer: Self-pay | Admitting: Physician Assistant

## 2023-01-14 ENCOUNTER — Ambulatory Visit
Admission: RE | Admit: 2023-01-14 | Discharge: 2023-01-14 | Disposition: A | Discharge status: Home / Self Care | Payer: BC Managed Care – PPO | Source: Ambulatory Visit

## 2023-01-14 DIAGNOSIS — Z1231 Encounter for screening mammogram for malignant neoplasm of breast: Secondary | ICD-10-CM

## 2023-01-18 ENCOUNTER — Other Ambulatory Visit: Payer: Self-pay | Admitting: Internal Medicine

## 2023-01-18 DIAGNOSIS — R928 Other abnormal and inconclusive findings on diagnostic imaging of breast: Secondary | ICD-10-CM

## 2023-01-28 ENCOUNTER — Ambulatory Visit
Admission: RE | Admit: 2023-01-28 | Discharge: 2023-01-28 | Disposition: A | Discharge status: Home / Self Care | Payer: BC Managed Care – PPO | Source: Ambulatory Visit | Attending: Internal Medicine | Admitting: Internal Medicine

## 2023-01-28 ENCOUNTER — Other Ambulatory Visit: Payer: Self-pay | Admitting: Internal Medicine

## 2023-01-28 DIAGNOSIS — R928 Other abnormal and inconclusive findings on diagnostic imaging of breast: Secondary | ICD-10-CM

## 2023-01-28 DIAGNOSIS — N6310 Unspecified lump in the right breast, unspecified quadrant: Secondary | ICD-10-CM

## 2023-02-04 ENCOUNTER — Ambulatory Visit
Admission: RE | Admit: 2023-02-04 | Discharge: 2023-02-04 | Disposition: A | Discharge status: Home / Self Care | Payer: BC Managed Care – PPO | Source: Ambulatory Visit | Attending: Internal Medicine | Admitting: Internal Medicine

## 2023-02-04 DIAGNOSIS — R928 Other abnormal and inconclusive findings on diagnostic imaging of breast: Secondary | ICD-10-CM

## 2023-02-04 DIAGNOSIS — N6310 Unspecified lump in the right breast, unspecified quadrant: Secondary | ICD-10-CM

## 2023-02-04 HISTORY — PX: BREAST BIOPSY: SHX20

## 2023-02-12 ENCOUNTER — Other Ambulatory Visit: Payer: Self-pay | Admitting: Internal Medicine

## 2023-02-14 ENCOUNTER — Other Ambulatory Visit: Payer: Self-pay | Admitting: Internal Medicine

## 2023-02-14 DIAGNOSIS — I119 Hypertensive heart disease without heart failure: Secondary | ICD-10-CM

## 2023-02-14 DIAGNOSIS — I10 Essential (primary) hypertension: Secondary | ICD-10-CM

## 2023-02-18 ENCOUNTER — Encounter: Payer: Self-pay | Admitting: Internal Medicine

## 2023-02-18 ENCOUNTER — Ambulatory Visit (INDEPENDENT_AMBULATORY_CARE_PROVIDER_SITE_OTHER): Payer: BC Managed Care – PPO | Admitting: Internal Medicine

## 2023-02-18 VITALS — BP 130/80 | HR 78 | Temp 98.7°F | Ht 62.0 in | Wt 184.0 lb

## 2023-02-18 DIAGNOSIS — J3089 Other allergic rhinitis: Secondary | ICD-10-CM

## 2023-02-18 DIAGNOSIS — E782 Mixed hyperlipidemia: Secondary | ICD-10-CM | POA: Diagnosis not present

## 2023-02-18 DIAGNOSIS — R7302 Impaired glucose tolerance (oral): Secondary | ICD-10-CM | POA: Diagnosis not present

## 2023-02-18 DIAGNOSIS — Z0001 Encounter for general adult medical examination with abnormal findings: Secondary | ICD-10-CM | POA: Diagnosis not present

## 2023-02-18 DIAGNOSIS — I119 Hypertensive heart disease without heart failure: Secondary | ICD-10-CM | POA: Diagnosis not present

## 2023-02-18 DIAGNOSIS — I10 Essential (primary) hypertension: Secondary | ICD-10-CM

## 2023-02-18 DIAGNOSIS — R1319 Other dysphagia: Secondary | ICD-10-CM

## 2023-02-18 LAB — COMPREHENSIVE METABOLIC PANEL
ALT: 17 U/L (ref 0–35)
AST: 21 U/L (ref 0–37)
Albumin: 4 g/dL (ref 3.5–5.2)
Alkaline Phosphatase: 105 U/L (ref 39–117)
BUN: 10 mg/dL (ref 6–23)
CO2: 31 mEq/L (ref 19–32)
Calcium: 9.7 mg/dL (ref 8.4–10.5)
Chloride: 102 mEq/L (ref 96–112)
Creatinine, Ser: 0.65 mg/dL (ref 0.40–1.20)
GFR: 93.56 mL/min (ref >60.00)
Glucose, Bld: 96 mg/dL (ref 70–99)
Potassium: 3.7 mEq/L (ref 3.5–5.1)
Sodium: 140 mEq/L (ref 135–145)
Total Bilirubin: 0.7 mg/dL (ref 0.2–1.2)
Total Protein: 7.5 g/dL (ref 6.0–8.3)

## 2023-02-18 LAB — CBC
HCT: 41.2 % (ref 36.0–46.0)
Hemoglobin: 13.5 g/dL (ref 12.0–15.0)
MCHC: 32.7 g/dL (ref 30.0–36.0)
MCV: 82.9 fl (ref 78.0–100.0)
Platelets: 245 10*3/uL (ref 150.0–400.0)
RBC: 4.97 Mil/uL (ref 3.87–5.11)
RDW: 15.2 % (ref 11.5–15.5)
WBC: 4.4 10*3/uL (ref 4.0–10.5)

## 2023-02-18 LAB — LIPID PANEL
Cholesterol: 174 mg/dL (ref 0–200)
HDL: 47.5 mg/dL (ref >39.00)
LDL Cholesterol: 101 mg/dL — ABNORMAL HIGH (ref 0–99)
NonHDL: 126.43
Total CHOL/HDL Ratio: 4
Triglycerides: 127 mg/dL (ref 0.0–149.0)
VLDL: 25.4 mg/dL (ref 0.0–40.0)

## 2023-02-18 LAB — HEMOGLOBIN A1C: Hgb A1c MFr Bld: 5.9 % (ref 4.6–6.5)

## 2023-02-18 MED ORDER — PRAVASTATIN SODIUM 10 MG PO TABS: 10.0000 mg | ORAL_TABLET | Freq: Every day | ORAL | 3 refills | Status: DC

## 2023-02-18 MED ORDER — MONTELUKAST SODIUM 10 MG PO TABS: 10.0000 mg | ORAL_TABLET | Freq: Every day | ORAL | 3 refills | Status: DC

## 2023-02-18 MED ORDER — AMLODIPINE BESYLATE 5 MG PO TABS: 5.0000 mg | ORAL_TABLET | Freq: Every day | ORAL | 3 refills | Status: DC

## 2023-02-18 MED ORDER — HYDROCHLOROTHIAZIDE 25 MG PO TABS: 25.0000 mg | ORAL_TABLET | Freq: Every day | ORAL | 3 refills | Status: DC

## 2023-02-18 MED ORDER — PANTOPRAZOLE SODIUM 40 MG PO TBEC: 40.0000 mg | DELAYED_RELEASE_TABLET | Freq: Every day | ORAL | 3 refills | Status: DC

## 2023-02-18 NOTE — Assessment & Plan Note (Signed)
BP at goal on amlodipine 5 mg daily and hydrochlorothiazide 25 mg daily. Checking CMP and adjust as needed.

## 2023-02-18 NOTE — Progress Notes (Signed)
   Subjective:   Patient ID: Sylvia Davies, female    DOB: 03-17-1961, 62 y.o.   MRN: [id number]  HPI The patient is here for physical.  PMH, [facility], social history reviewed and updated  Review of Systems  Constitutional: Negative.   HENT: Negative.    Eyes: Negative.   Respiratory:  Negative for cough, chest tightness and shortness of breath.   Cardiovascular:  Negative for chest pain, palpitations and leg swelling.  Gastrointestinal:  Negative for abdominal distention, abdominal pain, constipation, diarrhea, nausea and vomiting.  Musculoskeletal: Negative.   Skin: Negative.   Neurological: Negative.   Psychiatric/Behavioral: Negative.      Objective:  Physical Exam Constitutional:      Appearance: She is well-developed.  HENT:     Head: Normocephalic and atraumatic.  Cardiovascular:     Rate and Rhythm: Normal rate and regular rhythm.  Pulmonary:     Effort: Pulmonary effort is normal. No respiratory distress.     Breath sounds: Normal breath sounds. No wheezing or rales.  Abdominal:     General: Bowel sounds are normal. There is no distension.     Palpations: Abdomen is soft.     Tenderness: There is no abdominal tenderness. There is no rebound.  Musculoskeletal:     Cervical back: Normal range of motion.  Skin:    General: Skin is warm and dry.  Neurological:     Mental Status: She is alert and oriented to person, place, and time.     Coordination: Coordination normal.     Vitals:   02/18/23 0833  BP: 130/80  Pulse: 78  Temp: 98.7 F (37.1 C)  TempSrc: Oral  SpO2: 98%  Weight: 184 lb (83.5 kg)  Height: 5\' 2"  (1.575 m)    Assessment & Plan:

## 2023-02-18 NOTE — Assessment & Plan Note (Signed)
Improving with protonix 40 mg daily and advised to continue. Seeing GI next week.

## 2023-02-18 NOTE — Assessment & Plan Note (Signed)
Checking lipid panel and adjust pravastatin 10 mg daily as needed.

## 2023-02-18 NOTE — Assessment & Plan Note (Signed)
Checking HgA1c. Adjust as needed. 

## 2023-02-18 NOTE — Assessment & Plan Note (Signed)
Good BP control and will continue amlodipine 5 mg daily and hydrochlorothiazide 25 mg daily. No symptoms. Continue close monitoring.

## 2023-02-18 NOTE — Assessment & Plan Note (Signed)
Taking xyzal and singulair and refilled both. She is doing well on this regimen will continue.

## 2023-02-18 NOTE — Assessment & Plan Note (Signed)
Flu shot yearly. Shingrix complete. Tetanus up to date. Colonoscopy up to date. Mammogram up to date, pap smear up to date and dexa up to date. Counseled about sun safety and mole surveillance. Counseled about the dangers of distracted driving. Given 10 year screening recommendations.

## 2023-02-22 NOTE — Progress Notes (Unsigned)
02/23/2023 Sylvia Davies [id number] [birth year]  Referring provider: Myrlene Broker, * Primary GI doctor: Dr. Marina Goodell  ASSESSMENT AND PLAN:   Paraesophageal dysphagia with possible GERD Barium swallow showed no strictures, had slight delay, small hiatal hernia SLP osteophytes seen, no aspiration.  From description appears to be more upper oropharyngeal or paraesophageal, possible GERD dysmotility, osteophytes Given protonix and symptoms have resolved She will do at least 3 months of protonix and can try a taper down but if will start back on it if any return of symptoms No need for EGD at this time, if symptoms return or any new symptoms can consider.   Irritable bowel syndrome, unspecified type Controlled Continue fiber  History of adenomatous polyp of colon Recall 2028   Patient Care Team: Myrlene Broker, MD as PCP - General (Internal Medicine)  HISTORY OF PRESENT ILLNESS: 62 y.o. female with a past medical history of MR, THN, LVF, GERD, IBS, chol, and others listed below presents for evaluation of esophageal dysphagia.   05/06/2020 colonoscopy Dr. Jeannene Patella, excellent prep 2 polyps 1 to 3 mm transverse colon recall 7-10 years.  Patient having swallowing issues starting in February, rare at first she increasing to once per week.   Saw ENT with laryngoscope he with mild cord edema.   Patient started on Protonix 40 mg once daily and modified barium swallow scheduled. 12/31/2022 barium swallow showed barium tablet becoming stuck in proximal thoracic esophagus just above the aortic knob where there is a mild smooth impression of esophagus without evidence of high-grade intrinsic stricture small sliding scale hiatal hernia otherwise unremarkable esophagram SLP showed normal oropharyngeal swallowing ability no aspiration had cervical osteophytes at C5-C6 but do not impair barium flow during MBS.  No follow-up.  She started to have swallowing issues in  Feb, could be liquid or solids, coming and going. She would start gagging/coughing and would not eat or drink anything without someone near by.  Was intermittent but became more frequent, would feel she could not breath at time.  No epigastric pain, no clearing of throat, no hoarseness.  No NSAIDS, no ETOH.  She denies tobacco use.  She denies drug use.    She  reports that she has never smoked. She has never been exposed to tobacco smoke. She has never used smokeless tobacco. She reports that she does not drink alcohol and does not use drugs.  RELEVANT LABS AND IMAGING: CBC    Component Value Date/Time   WBC 4.4 02/18/2023 0858   RBC 4.97 02/18/2023 0858   HGB 13.5 02/18/2023 0858   HCT 41.2 02/18/2023 0858   PLT 245.0 02/18/2023 0858   MCV 82.9 02/18/2023 0858   MCH 27.5 01/03/2021 0350   MCHC 32.7 02/18/2023 0858   RDW 15.2 02/18/2023 0858   LYMPHSABS 2.2 01/29/2021 1500   MONOABS 0.5 01/29/2021 1500   EOSABS 0.2 01/29/2021 1500   BASOSABS 0.0 01/29/2021 1500   Recent Labs    02/18/23 0858  HGB 13.5    CMP     Component Value Date/Time   NA 140 02/18/2023 0858   K 3.7 02/18/2023 0858   CL 102 02/18/2023 0858   CO2 31 02/18/2023 0858   GLUCOSE 96 02/18/2023 0858   BUN 10 02/18/2023 0858   CREATININE 0.65 02/18/2023 0858   CREATININE 0.68 01/28/2020 1504   CALCIUM 9.7 02/18/2023 0858   PROT 7.5 02/18/2023 0858   ALBUMIN 4.0 02/18/2023 0858   AST 21 02/18/2023 0858  ALT 17 02/18/2023 0858   ALKPHOS 105 02/18/2023 0858   BILITOT 0.7 02/18/2023 0858   GFRNONAA >60 01/03/2021 0350   GFRAA >60 04/29/2018 0440      Latest Ref Rng & Units 02/18/2023    8:58 AM 02/05/2022    9:22 AM 01/28/2020    3:04 PM  Hepatic Function  Total Protein 6.0 - 8.3 g/dL 7.5  7.5  7.6   Albumin 3.5 - 5.2 g/dL 4.0  4.1    AST 0 - 37 U/L 21  18  17    ALT 0 - 35 U/L 17  13  14    Alk Phosphatase 39 - 117 U/L 105  100    Total Bilirubin 0.2 - 1.2 mg/dL 0.7  0.5  0.6       Current  Medications:    Current Outpatient Medications (Cardiovascular):    amLODipine (NORVASC) 5 MG tablet, Take 1 tablet (5 mg total) by mouth daily.   hydrochlorothiazide (HYDRODIURIL) 25 MG tablet, Take 1 tablet (25 mg total) by mouth daily.   pravastatin (PRAVACHOL) 10 MG tablet, Take 1 tablet (10 mg total) by mouth daily.  Current Outpatient Medications (Respiratory):    azelastine (ASTELIN) 0.1 % nasal spray, Place 1 spray into both nostrils 2 (two) times daily as needed for rhinitis. Use in each nostril as directed   fluticasone (FLONASE) 50 MCG/ACT nasal spray, Place 2 sprays into both nostrils daily.   levocetirizine (XYZAL) 5 MG tablet, TAKE 1 TABLET BY MOUTH EVERY DAY IN THE EVENING  Current Outpatient Medications (Analgesics):    acetaminophen (TYLENOL) 650 MG CR tablet, Take 1,300 mg by mouth every 8 (eight) hours as needed for pain.  Current Outpatient Medications (Hematological):    vitamin B-12 (CYANOCOBALAMIN) 1000 MCG tablet, Take 1,000 mcg by mouth daily.  Current Outpatient Medications (Other):    cholecalciferol (VITAMIN D3) 25 MCG (1000 UT) tablet, Take 1,000 Units by mouth daily.   Multiple Vitamins-Minerals (MULTIVITAMIN PO), Take 1 tablet by mouth daily.   pantoprazole (PROTONIX) 40 MG tablet, Take 1 tablet (40 mg total) by mouth daily.  Medical History:  Past Medical History:  Diagnosis Date   ALLERGIC RHINITIS 04/02/2007   Allergy    Arthritis    BREAST BIOPSY, HX OF 04/02/2007   Diverticulosis    FIBROCYSTIC BREAST DISEASE, HX OF 04/02/2007   GERD (gastroesophageal reflux disease)    Heart murmur    Hemorrhoids    HYPERLIPIDEMIA 04/02/2007   HYPERTENSION, BENIGN ESSENTIAL 05/04/2007   Impaired glucose tolerance 11/08/2012   Irritable bowel syndrome 04/02/2007   MITRAL REGURGITATION, 0 (MILD) 04/02/2007   OBESITY 04/02/2007   Allergies:  Allergies  Allergen Reactions   Tramadol Hives   Amlodipine Besy-Benazepril Hcl Itching    REACTION:sore throat    Amlodipine Besy-Benazepril Hcl Itching    REACTION:sore throat   Benazepril Hcl Other (See Comments)   Penicillins     Has patient had a PCN reaction causing immediate rash, facial/tongue/throat swelling, SOB or lightheadedness with hypotension: no Has patient had a PCN reaction causing severe rash involving mucus membranes or skin necrosis: no Has patient had a PCN reaction that required hospitalization no Has patient had a PCN reaction occurring within the last 10 years: yes If all of the above answers are "NO", then may proceed with Cephalosporin use.    Cleocin [Clindamycin Hcl] Itching   Hyoscyamine Other (See Comments)    Intolerance    Zylet [Loteprednol-Tobramycin] Itching     Surgical History:  She  has a past surgical history that includes Breast biopsy (Left); Leg Surgery (Left); Eye surgery (Left); DG 5TH DIGIT LEFT FOOT (Left); Colonoscopy; Dilatation & curettage/hysteroscopy with myosure (N/A, 06/02/2016); Breast excisional biopsy (Left); Joint replacement; Total hip arthroplasty (Right, 04/28/2018); Total hip arthroplasty (Left, 01/02/2021); and Breast biopsy (Right, 02/04/2023). Family History:  Her family history includes Diabetes in her maternal grandmother; Hypertension in her mother.  REVIEW OF SYSTEMS  : All other systems reviewed and negative except where noted in the History of Present Illness.  PHYSICAL EXAM: BP 126/74   Pulse 84   Ht 5\' 2"  (1.575 m)   Wt 188 lb (85.3 kg)   LMP 07/31/2013   BMI 34.39 kg/m  General Appearance: Well nourished, in no apparent distress. Head:   Normocephalic and atraumatic. Eyes:  sclerae anicteric,conjunctive pink  Respiratory: Respiratory effort normal, BS equal bilaterally without rales, rhonchi, wheezing. Cardio: RRR with no MRGs. Peripheral pulses intact.  Abdomen: Soft,  Obese ,active bowel sounds. No tenderness . Without guarding and Without rebound. No masses. Rectal: Not evaluated Musculoskeletal: Full ROM, Normal  gait. Without edema. Skin:  Dry and intact without significant lesions or rashes Neuro: Alert and  oriented x4;  No focal deficits. Psych:  Cooperative. Normal mood and affect.    Doree Albee, PA-C 2:24 PM

## 2023-02-23 ENCOUNTER — Ambulatory Visit: Payer: BC Managed Care – PPO | Admitting: Physician Assistant

## 2023-02-23 ENCOUNTER — Encounter: Payer: Self-pay | Admitting: Physician Assistant

## 2023-02-23 VITALS — BP 126/74 | HR 84 | Ht 62.0 in | Wt 188.0 lb

## 2023-02-23 DIAGNOSIS — K589 Irritable bowel syndrome without diarrhea: Secondary | ICD-10-CM | POA: Diagnosis not present

## 2023-02-23 DIAGNOSIS — Z86010 Personal history of colonic polyps: Secondary | ICD-10-CM

## 2023-02-23 DIAGNOSIS — R1314 Dysphagia, pharyngoesophageal phase: Secondary | ICD-10-CM | POA: Diagnosis not present

## 2023-02-23 DIAGNOSIS — K219 Gastro-esophageal reflux disease without esophagitis: Secondary | ICD-10-CM

## 2023-02-23 NOTE — Patient Instructions (Addendum)
Please take your proton pump inhibitor medication, protonix 40 mg for at least 3 months, then can try to do every other day and stop or stay on that.  Can also try substitution with pepcid/famotidine.   Please take this medication 30 minutes to 1 hour before meals- this makes it more effective.  Avoid spicy and acidic foods Avoid fatty foods Limit your intake of coffee, tea, alcohol, and carbonated drinks Work to maintain a healthy weight Keep the head of the bed elevated at least 3 inches with blocks or a wedge pillow if you are having any nighttime symptoms Stay upright for 2 hours after eating Avoid meals and snacks three to four hours before bedtime  Dysphagia precautions:  1. Take reflux medications 30+ minutes before food in the morning 2. Begin meals with warm beverage 3. Eat smaller more frequent meals 4. Eat slowly, taking small bites and sips 5. Alternate solids and liquids 6. Avoid foods/liquids that increase acid production 7. Sit upright during and for 30+ minutes after meals to facilitate esophageal clearing 8. All meats should be chopped finely.   If something gets hung in your esophagus and will not come up or go down, proceed to the emergency room.    Due for colon 2028

## 2023-02-23 NOTE — Progress Notes (Signed)
Sylvia Davies, Reviewed. I think that she SHOULD be scheduled for EGD given her esophagram. If this is negative, I will order a CT of the chest to rule out dysphagia aortica. I will keep you posted.  [name], Please contact patient and let her know that I have reviewed her workup regarding her dysphagia. Please let her know that I want her to have EGD in the [facility].  Thanks,  Dr. Marina Goodell

## 2023-02-28 ENCOUNTER — Encounter: Payer: Self-pay | Admitting: Internal Medicine

## 2023-02-28 NOTE — Progress Notes (Signed)
Spoke with pt and she is aware of Dr. Lamar Sprinkles recommendations. Pt scheduled for EGD in the [facility] 03/02/23 at 4pm. Pt to arrive at 3pm. Prep instructions sent to pt via mychart. Amb ref in epic.

## 2023-02-28 NOTE — Addendum Note (Signed)
Addended by: Selinda Michaels R on: 02/28/2023 10:48 AM   Modules accepted: Orders

## 2023-03-01 ENCOUNTER — Telehealth: Payer: Self-pay | Admitting: Orthopaedic Surgery

## 2023-03-01 NOTE — Telephone Encounter (Signed)
Mailed to patient address

## 2023-03-01 NOTE — Telephone Encounter (Signed)
Patient called in stating she needs a card stating she had hip replacements and has metal in her body please advise pt would like 2 card mailed to her address

## 2023-03-02 ENCOUNTER — Ambulatory Visit: Payer: BC Managed Care – PPO | Admitting: Internal Medicine

## 2023-03-02 ENCOUNTER — Encounter: Payer: Self-pay | Admitting: Internal Medicine

## 2023-03-02 VITALS — BP 157/81 | HR 67 | Temp 98.1°F | Resp 16 | Ht 62.0 in | Wt 188.0 lb

## 2023-03-02 DIAGNOSIS — K222 Esophageal obstruction: Secondary | ICD-10-CM

## 2023-03-02 DIAGNOSIS — K219 Gastro-esophageal reflux disease without esophagitis: Secondary | ICD-10-CM

## 2023-03-02 DIAGNOSIS — R1314 Dysphagia, pharyngoesophageal phase: Secondary | ICD-10-CM

## 2023-03-02 MED ORDER — SODIUM CHLORIDE 0.9 % IV SOLN
500.0000 mL | Freq: Once | INTRAVENOUS | Status: DC
Start: 2023-03-02 — End: 2023-05-25

## 2023-03-02 NOTE — Progress Notes (Signed)
Report to PACU, RN, vss, BBS= Clear.  

## 2023-03-02 NOTE — Progress Notes (Signed)
Sylvia Davies, Reviewed. I think that she SHOULD be scheduled for EGD given her esophagram. If this is negative, I will order a CT of the chest to rule out dysphagia aortica. I will keep you posted.   Sylvia Davies, Please contact patient and let her know that I have reviewed her workup regarding her dysphagia. Please let her know that I want her to have EGD in the [facility].   Thanks,   Dr. Marina Goodell      Progress Notes by Doree Albee, PA-C at 02/23/2023 2:00 PM  Author: Doree Albee, PA-C Author Type: Physician Assistant Certified Filed: 02/23/2023  2:35 PM  Note Status: Signed Cosign: Cosign Not Required Encounter Date: 02/23/2023  Editor: Javier Glazier (Physician Assistant Certified)             Expand All Collapse All        02/23/2023 Sylvia Davies [id number] 20-Oct-1960   Referring provider: Myrlene Broker, * Primary GI doctor: Dr. Marina Goodell   ASSESSMENT AND PLAN:    Paraesophageal dysphagia with possible GERD Barium swallow showed no strictures, had slight delay, small hiatal hernia SLP osteophytes seen, no aspiration.  From description appears to be more upper oropharyngeal or paraesophageal, possible GERD dysmotility, osteophytes Given protonix and symptoms have resolved She will do at least 3 months of protonix and can try a taper down but if will start back on it if any return of symptoms No need for EGD at this time, if symptoms return or any new symptoms can consider.    Irritable bowel syndrome, unspecified type Controlled Continue fiber   History of adenomatous polyp of colon Recall 2028     Patient Care Team: Myrlene Broker, MD as PCP - General (Internal Medicine)   HISTORY OF PRESENT ILLNESS: 62 y.o. female with a past medical history of MR, THN, LVF, GERD, IBS, chol, and others listed below presents for evaluation of esophageal dysphagia.    05/06/2020 colonoscopy Dr. Jeannene Patella, excellent prep 2 polyps 1 to 3 mm transverse colon  recall 7-10 years.   Patient having swallowing issues starting in February, rare at first she increasing to once per week.   Saw ENT with laryngoscope he with mild cord edema.   Patient started on Protonix 40 mg once daily and modified barium swallow scheduled. 12/31/2022 barium swallow showed barium tablet becoming stuck in proximal thoracic esophagus just above the aortic knob where there is a mild smooth impression of esophagus without evidence of high-grade intrinsic stricture small sliding scale hiatal hernia otherwise unremarkable esophagram SLP showed normal oropharyngeal swallowing ability no aspiration had cervical osteophytes at C5-C6 but do not impair barium flow during MBS.  No follow-up.   She started to have swallowing issues in Feb, could be liquid or solids, coming and going. She would start gagging/coughing and would not eat or drink anything without someone near by.  Was intermittent but became more frequent, would feel she could not breath at time.  No epigastric pain, no clearing of throat, no hoarseness.  No NSAIDS, no ETOH.  She denies tobacco use.  She denies drug use.     She  reports that she has never smoked. She has never been exposed to tobacco smoke. She has never used smokeless tobacco. She reports that she does not drink alcohol and does not use drugs.   RELEVANT LABS AND IMAGING: CBC Labs (Brief)          Component Value Date/Time    WBC 4.4 02/18/2023  0858    RBC 4.97 02/18/2023 0858    HGB 13.5 02/18/2023 0858    HCT 41.2 02/18/2023 0858    PLT 245.0 02/18/2023 0858    MCV 82.9 02/18/2023 0858    MCH 27.5 01/03/2021 0350    MCHC 32.7 02/18/2023 0858    RDW 15.2 02/18/2023 0858    LYMPHSABS 2.2 01/29/2021 1500    MONOABS 0.5 01/29/2021 1500    EOSABS 0.2 01/29/2021 1500    BASOSABS 0.0 01/29/2021 1500      Recent Labs (within last 365 days)     Recent Labs    02/18/23 0858  HGB 13.5        CMP     Labs (Brief)          Component Value  Date/Time    NA 140 02/18/2023 0858    K 3.7 02/18/2023 0858    CL 102 02/18/2023 0858    CO2 31 02/18/2023 0858    GLUCOSE 96 02/18/2023 0858    BUN 10 02/18/2023 0858    CREATININE 0.65 02/18/2023 0858    CREATININE 0.68 01/28/2020 1504    CALCIUM 9.7 02/18/2023 0858    PROT 7.5 02/18/2023 0858    ALBUMIN 4.0 02/18/2023 0858    AST 21 02/18/2023 0858    ALT 17 02/18/2023 0858    ALKPHOS 105 02/18/2023 0858    BILITOT 0.7 02/18/2023 0858    GFRNONAA >60 01/03/2021 0350    GFRAA >60 04/29/2018 0440          Latest Ref Rng & Units 02/18/2023    8:58 AM 02/05/2022    9:22 AM 01/28/2020    3:04 PM  Hepatic Function  Total Protein 6.0 - 8.3 g/dL 7.5  7.5  7.6   Albumin 3.5 - 5.2 g/dL 4.0  4.1     AST 0 - 37 U/L 21  18  17    ALT 0 - 35 U/L 17  13  14    Alk Phosphatase 39 - 117 U/L 105  100     Total Bilirubin 0.2 - 1.2 mg/dL 0.7  0.5  0.6       Current Medications:      Current Outpatient Medications (Cardiovascular):    amLODipine (NORVASC) 5 MG tablet, Take 1 tablet (5 mg total) by mouth daily.   hydrochlorothiazide (HYDRODIURIL) 25 MG tablet, Take 1 tablet (25 mg total) by mouth daily.   pravastatin (PRAVACHOL) 10 MG tablet, Take 1 tablet (10 mg total) by mouth daily.   Current Outpatient Medications (Respiratory):    azelastine (ASTELIN) 0.1 % nasal spray, Place 1 spray into both nostrils 2 (two) times daily as needed for rhinitis. Use in each nostril as directed   fluticasone (FLONASE) 50 MCG/ACT nasal spray, Place 2 sprays into both nostrils daily.   levocetirizine (XYZAL) 5 MG tablet, TAKE 1 TABLET BY MOUTH EVERY DAY IN THE EVENING   Current Outpatient Medications (Analgesics):    acetaminophen (TYLENOL) 650 MG CR tablet, Take 1,300 mg by mouth every 8 (eight) hours as needed for pain.   Current Outpatient Medications (Hematological):    vitamin B-12 (CYANOCOBALAMIN) 1000 MCG tablet, Take 1,000 mcg by mouth daily.   Current Outpatient Medications (Other):     cholecalciferol (VITAMIN D3) 25 MCG (1000 UT) tablet, Take 1,000 Units by mouth daily.   Multiple Vitamins-Minerals (MULTIVITAMIN PO), Take 1 tablet by mouth daily.   pantoprazole (PROTONIX) 40 MG tablet, Take 1 tablet (40 mg total) by mouth daily.  Medical History:      Past Medical History:  Diagnosis Date   ALLERGIC RHINITIS 04/02/2007   Allergy     Arthritis     BREAST BIOPSY, HX OF 04/02/2007   Diverticulosis     FIBROCYSTIC BREAST DISEASE, HX OF 04/02/2007   GERD (gastroesophageal reflux disease)     Heart murmur     Hemorrhoids     HYPERLIPIDEMIA 04/02/2007   HYPERTENSION, BENIGN ESSENTIAL 05/04/2007   Impaired glucose tolerance 11/08/2012   Irritable bowel syndrome 04/02/2007   MITRAL REGURGITATION, 0 (MILD) 04/02/2007   OBESITY 04/02/2007        Allergies:  Allergies       Allergies  Allergen Reactions   Tramadol Hives   Amlodipine Besy-Benazepril Hcl Itching      REACTION:sore throat   Amlodipine Besy-Benazepril Hcl Itching      REACTION:sore throat   Benazepril Hcl Other (See Comments)   Penicillins        Has patient had a PCN reaction causing immediate rash, facial/tongue/throat swelling, SOB or lightheadedness with hypotension: no Has patient had a PCN reaction causing severe rash involving mucus membranes or skin necrosis: no Has patient had a PCN reaction that required hospitalization no Has patient had a PCN reaction occurring within the last 10 years: yes If all of the above answers are "NO", then may proceed with Cephalosporin use.     Cleocin [Clindamycin Hcl] Itching   Hyoscyamine Other (See Comments)      Intolerance     Zylet [Loteprednol-Tobramycin] Itching        Surgical History:  She  has a past surgical history that includes Breast biopsy (Left); Leg Surgery (Left); Eye surgery (Left); DG 5TH DIGIT LEFT FOOT (Left); Colonoscopy; Dilatation & curettage/hysteroscopy with myosure (N/A, 06/02/2016); Breast excisional biopsy (Left); Joint  replacement; Total hip arthroplasty (Right, 04/28/2018); Total hip arthroplasty (Left, 01/02/2021); and Breast biopsy (Right, 02/04/2023). Family History:  Her family history includes Diabetes in her maternal grandmother; Hypertension in her mother.   REVIEW OF SYSTEMS  : All other systems reviewed and negative except where noted in the History of Present Illness.   PHYSICAL EXAM: BP 126/74   Pulse 84   Ht 5\' 2"  (1.575 m)   Wt 188 lb (85.3 kg)   LMP 07/31/2013   BMI 34.39 kg/m  General Appearance: Well nourished, in no apparent distress. Head:   Normocephalic and atraumatic. Eyes:  sclerae anicteric,conjunctive pink  Respiratory: Respiratory effort normal, BS equal bilaterally without rales, rhonchi, wheezing. Cardio: RRR with no MRGs. Peripheral pulses intact.  Abdomen: Soft,  Obese ,active bowel sounds. No tenderness . Without guarding and Without rebound. No masses. Rectal: Not evaluated Musculoskeletal: Full ROM, Normal gait. Without edema. Skin:  Dry and intact without significant lesions or rashes Neuro: Alert and  oriented x4;  No focal deficits. Psych:  Cooperative. Normal mood and affect.      Doree Albee, PA-C 2:24 PM  Recent esophagram as outlined below.  IMPRESSION: 1. Barium tablet became stuck in the proximal thoracic esophagus just above the aortic knob where there is a mild smooth impression on the esophagus without evidence of a high-grade intrinsic stricture. 2. Small sliding hiatal hernia. 3. Otherwise unremarkable esophagram.     Electronically Signed   By: Sebastian Ache M.D.   On: 12/31/2022 14:35  Now for EGD

## 2023-03-02 NOTE — Progress Notes (Signed)
Called to room to assist during endoscopic procedure.  Patient ID and intended procedure confirmed with present staff. Received instructions for my participation in the procedure from the performing physician.  

## 2023-03-02 NOTE — Op Note (Signed)
[facility] Patient Name: Sylvia Davies Procedure Date: 03/02/2023 3:28 PM MRN: [id number] Endoscopist: Wilhemina Bonito. Marina Goodell , MD, [id number] Age: 62 Referring MD:  Date of Birth: 11/20/1960 Gender: Female Account #: [id number] Procedure:                Upper GI endoscopy with balloon dilation of the                            esophagus Indications:              Dysphagia, Abnormal cine-esophagram Medicines:                Monitored Anesthesia Care Procedure:                Pre-Anesthesia Assessment:                           - Prior to the procedure, a History and Physical                            was performed, and patient medications and                            allergies were reviewed. The patient's tolerance of                            previous anesthesia was also reviewed. The risks                            and benefits of the procedure and the sedation                            options and risks were discussed with the patient.                            All questions were answered, and informed consent                            was obtained. Prior Anticoagulants: The patient has                            taken no anticoagulant or antiplatelet agents. ASA                            Grade Assessment: II - A patient with mild systemic                            disease. After reviewing the risks and benefits,                            the patient was deemed in satisfactory condition to                            undergo the procedure.  After obtaining informed consent, the endoscope was                            passed under direct vision. Throughout the                            procedure, the patient's blood pressure, pulse, and                            oxygen saturations were monitored continuously. The                            GIF [id number] #[id number] was introduced through the                            mouth, and advanced to  the second part of duodenum.                            The upper GI endoscopy was accomplished without                            difficulty. The patient tolerated the procedure                            well. Scope In: Scope Out: Findings:                 The esophagus was tubular in the proximal portion.                            At the gastroesophageal junction (35 cm from the                            incisors) there was a 14 mm ringlike stricture.                            After completing the endoscopic survey, A TTS                            dilator was passed through the scope. Dilation with                            a 16-17-18 mm balloon dilator was performed on the                            distal stricture. This was dilated to a maximal                            diameter of 18 mm. No ring disruption. The more                            proximal esophageal stricture was dilated with a 16  mm balloon. Significant mucosal disruption..                           The stomach was normal. Small hiatal hernia.                           The examined duodenum was normal.                           The cardia and gastric fundus were normal on                            retroflexion. Complications:            No immediate complications. Estimated Blood Loss:     Estimated blood loss: Minimal. Impression:               1. Distal esophageal stricture dilated to 18 mm                           2. Proximal esophageal stricture dilated to 16 mm.                            Mucosal disruption                           3. Small hiatal hernia                           4. Otherwise unremarkable EGD Recommendation:           1. Patient has a contact number available for                            emergencies. The signs and symptoms of potential                            delayed complications were discussed with the                            patient. Return to  normal activities tomorrow.                            Written discharge instructions were provided to the                            patient.                           2. N.p.o. for 2 hours, then clear liquids till a.m.                            Soft diet in a.m, then advance to regular as                            tolerated. Chew food well.  3. Continue present medications. Continue                            pantoprazole.                           4. Office follow-up with Dr. Marina Goodell in 4 to 6 weeks Wilhemina Bonito. Marina Goodell, MD 03/02/2023 3:59:19 PM This report has been signed electronically.

## 2023-03-02 NOTE — Patient Instructions (Addendum)
Nothing by mouth until 6:00 pm this evening.  Clears liquids from 6:00pm until tomorrow morning.  Soft Diet in the AM, then advace to regular as tolerated.  Chew food well.  Please see Post Dilation Diet provided.  YOU HAD AN ENDOSCOPIC PROCEDURE TODAY AT THE [facility]:   Refer to the procedure report that was given to you for any specific questions about what was found during the examination.  If the procedure report does not answer your questions, please call your gastroenterologist to clarify.  If you requested that your care partner not be given the details of your procedure findings, then the procedure report has been included in a sealed envelope for you to review at your convenience later.  YOU SHOULD EXPECT: Some feelings of bloating in the abdomen. Passage of more gas than usual.  Walking can help get rid of the air that was put into your GI tract during the procedure and reduce the bloating. If you had a lower endoscopy (such as a colonoscopy or flexible sigmoidoscopy) you may notice spotting of blood in your stool or on the toilet paper. If you underwent a bowel prep for your procedure, you may not have a normal bowel movement for a few days.  Please Note:  You might notice some irritation and congestion in your nose or some drainage.  This is from the oxygen used during your procedure.  There is no need for concern and it should clear up in a day or so.  SYMPTOMS TO REPORT IMMEDIATELY:  Following upper endoscopy (EGD)  Vomiting of blood or coffee ground material  New chest pain or pain under the shoulder blades  Painful or persistently difficult swallowing  New shortness of breath  Fever of 100F or higher  Black, tarry-looking stools  For urgent or emergent issues, a gastroenterologist can be reached at any hour by calling [phone]. Do not use MyChart messaging for urgent concerns.    DIET:  Please see post dilation diet provided.  Drink plenty of fluids but  you should avoid alcoholic beverages for 24 hours.  ACTIVITY:  You should plan to take it easy for the rest of today and you should NOT DRIVE or use heavy machinery until tomorrow (because of the sedation medicines used during the test).    FOLLOW UP: Our staff will call the number listed on your records the next business day following your procedure.  We will call around 7:15- 8:00 am to check on you and address any questions or concerns that you may have regarding the information given to you following your procedure. If we do not reach you, we will leave a message.     If any biopsies were taken you will be contacted by phone or by letter within the next 1-3 weeks.  Please call us at [phone] if you have not heard about the biopsies in 3 weeks.    SIGNATURES/CONFIDENTIALITY: You and/or your care partner have signed paperwork which will be entered into your electronic medical record.  These signatures attest to the fact that that the information above on your After Visit Summary has been reviewed and is understood.  Full responsibility of the confidentiality of this discharge information lies with you and/or your care-partner.

## 2023-03-02 NOTE — Progress Notes (Signed)
Pt's states no medical or surgical changes since previsit or office visit. 

## 2023-03-03 ENCOUNTER — Telehealth: Payer: Self-pay | Admitting: *Deleted

## 2023-03-03 NOTE — Telephone Encounter (Signed)
  Follow up Call-     03/02/2023    2:47 PM  Call back number  Post procedure Call Back phone  # [phone]  Permission to leave phone message Yes     Patient questions:  Do you have a fever, pain , or abdominal swelling? No. Pain Score  0 *  Have you tolerated food without any problems? Yes.    Have you been able to return to your normal activities? Yes.    Do you have any questions about your discharge instructions: Diet   No. Medications  No. Follow up visit  No.  Do you have questions or concerns about your Care? No.  Actions: * If pain score is 4 or above: No action needed, pain <4.

## 2023-03-22 ENCOUNTER — Ambulatory Visit: Payer: BC Managed Care – PPO | Admitting: Physician Assistant

## 2023-04-11 ENCOUNTER — Ambulatory Visit: Payer: BC Managed Care – PPO | Admitting: Internal Medicine

## 2023-05-09 ENCOUNTER — Telehealth: Payer: Self-pay | Admitting: Internal Medicine

## 2023-05-09 NOTE — Telephone Encounter (Signed)
Patient wants to know if her kidney function is tested during her physical.  Please call patient and let her know.  [phone]

## 2023-05-09 NOTE — Telephone Encounter (Signed)
She had physical in August and yes kidney function was tested then.

## 2023-05-09 NOTE — Telephone Encounter (Signed)
This is included or if patient request?

## 2023-05-17 ENCOUNTER — Other Ambulatory Visit: Payer: Self-pay | Admitting: Internal Medicine

## 2023-05-18 ENCOUNTER — Telehealth: Payer: Self-pay | Admitting: Internal Medicine

## 2023-05-18 NOTE — Telephone Encounter (Signed)
Prescription Request  05/18/2023  LOV: 02/18/2023  What is the name of the medication or equipment? Triamcinolone Acetonide cream  Have you contacted your pharmacy to request a refill? Yes   Which pharmacy would you like this sent to?  [id number] - [place], [place] - [address] AT [facility] OF [address] [place] [address] Phone: [phone] Fax: [phone]     Patient notified that their request is being sent to the clinical staff for review and that they should receive a response within 2 business days.   Please advise at Mobile [phone] (mobile)

## 2023-05-20 NOTE — Telephone Encounter (Signed)
Called patient and is unable to LVM

## 2023-05-20 NOTE — Telephone Encounter (Signed)
Not on current medication list is she having new rash?

## 2023-05-20 NOTE — Telephone Encounter (Signed)
I did not see this on patients Medication list

## 2023-05-24 ENCOUNTER — Telehealth: Payer: Self-pay | Admitting: Internal Medicine

## 2023-05-24 NOTE — Telephone Encounter (Signed)
error 

## 2023-05-24 NOTE — Telephone Encounter (Signed)
Pt do have a rash and was wondering the status on this med refill. Please advise.  Thanks Prescription Request   05/18/2023   LOV: 02/18/2023   What is the name of the medication or equipment? Triamcinolone Acetonide cream   Have you contacted your pharmacy to request a refill? Yes    Which pharmacy would you like this sent to?  [id number] - [place], [place] - [address] AT [facility] OF [address]  [place] [address] Phone: [phone] Fax: [phone]       Patient notified that their request is being sent to the clinical staff for review and that they should receive a response within 2 business days.    Please advise at Mobile [phone] (mobile)

## 2023-05-25 MED ORDER — TRIAMCINOLONE ACETONIDE 0.1 % EX CREA: 1.0000 | TOPICAL_CREAM | Freq: Two times a day (BID) | CUTANEOUS | 0 refills | Status: DC

## 2023-05-25 NOTE — Telephone Encounter (Signed)
Done erx 

## 2023-05-31 NOTE — Progress Notes (Unsigned)
05/31/2023 Sylvia Davies [id number] 05-Nov-1960  Referring provider: Myrlene Broker, * Primary GI doctor: Dr. Marina Goodell  ASSESSMENT AND PLAN:   Paraesophageal dysphagia with possible GERD Barium swallow showed no strictures, had slight delay, small hiatal hernia SLP osteophytes seen, no aspiration.  From description appears to be more upper oropharyngeal or paraesophageal, possible GERD dysmotility, osteophytes Given protonix and symptoms have resolved She will do at least 3 months of protonix and can try a taper down but if will start back on it if any return of symptoms No need for EGD at this time, if symptoms return or any new symptoms can consider.   Irritable bowel syndrome, unspecified type Controlled Continue fiber  History of adenomatous polyp of colon Recall 2028   Patient Care Team: Myrlene Broker, MD as PCP - General (Internal Medicine)  HISTORY OF PRESENT ILLNESS: 62 y.o. female with a past medical history of MR, THN, LVF, GERD, IBS, chol, and others listed below presents for evaluation of esophageal dysphagia.   05/06/2020 colonoscopy Dr. Jeannene Patella, excellent prep 2 polyps 1 to 3 mm transverse colon recall 7-10 years.  Patient having swallowing issues starting in February, rare at first she increasing to once per week.   Saw ENT with laryngoscope he with mild cord edema.   Patient started on Protonix 40 mg once daily and modified barium swallow scheduled. 12/31/2022 barium swallow showed barium tablet becoming stuck in proximal thoracic esophagus just above the aortic knob where there is a mild smooth impression of esophagus without evidence of high-grade intrinsic stricture small sliding scale hiatal hernia otherwise unremarkable esophagram SLP showed normal oropharyngeal swallowing ability no aspiration had cervical osteophytes at C5-C6 but do not impair barium flow during MBS.  No follow-up.  02/23/2023 office with both symptoms have  improved of dysphagia with PPI however barium swallow showed abnormality at aortic knob 03/02/2023 EGD with Dr. Marina Goodell showed distal esophageal stricture dilated to 18 mm, proximal esophageal stricture dilated 16 mm mucosal disruption, small hiatal hernia Patient presents for follow-up.   She  reports that she has never smoked. She has never been exposed to tobacco smoke. She has never used smokeless tobacco. She reports that she does not drink alcohol and does not use drugs.  RELEVANT LABS AND IMAGING: CBC    Component Value Date/Time   WBC 4.4 02/18/2023 0858   RBC 4.97 02/18/2023 0858   HGB 13.5 02/18/2023 0858   HCT 41.2 02/18/2023 0858   PLT 245.0 02/18/2023 0858   MCV 82.9 02/18/2023 0858   MCH 27.5 01/03/2021 0350   MCHC 32.7 02/18/2023 0858   RDW 15.2 02/18/2023 0858   LYMPHSABS 2.2 01/29/2021 1500   MONOABS 0.5 01/29/2021 1500   EOSABS 0.2 01/29/2021 1500   BASOSABS 0.0 01/29/2021 1500   Recent Labs    02/18/23 0858  HGB 13.5    CMP     Component Value Date/Time   NA 140 02/18/2023 0858   K 3.7 02/18/2023 0858   CL 102 02/18/2023 0858   CO2 31 02/18/2023 0858   GLUCOSE 96 02/18/2023 0858   BUN 10 02/18/2023 0858   CREATININE 0.65 02/18/2023 0858   CREATININE 0.68 01/28/2020 1504   CALCIUM 9.7 02/18/2023 0858   PROT 7.5 02/18/2023 0858   ALBUMIN 4.0 02/18/2023 0858   AST 21 02/18/2023 0858   ALT 17 02/18/2023 0858   ALKPHOS 105 02/18/2023 0858   BILITOT 0.7 02/18/2023 0858   GFRNONAA >60 01/03/2021 0350   GFRAA >  60 04/29/2018 0440      Latest Ref Rng & Units 02/18/2023    8:58 AM 02/05/2022    9:22 AM 01/28/2020    3:04 PM  Hepatic Function  Total Protein 6.0 - 8.3 g/dL 7.5  7.5  7.6   Albumin 3.5 - 5.2 g/dL 4.0  4.1    AST 0 - 37 U/L 21  18  17    ALT 0 - 35 U/L 17  13  14    Alk Phosphatase 39 - 117 U/L 105  100    Total Bilirubin 0.2 - 1.2 mg/dL 0.7  0.5  0.6       Current Medications:    Current Outpatient Medications (Cardiovascular):     amLODipine (NORVASC) 5 MG tablet, Take 1 tablet (5 mg total) by mouth daily.   hydrochlorothiazide (HYDRODIURIL) 25 MG tablet, Take 1 tablet (25 mg total) by mouth daily.   pravastatin (PRAVACHOL) 10 MG tablet, Take 1 tablet (10 mg total) by mouth daily.  Current Outpatient Medications (Respiratory):    azelastine (ASTELIN) 0.1 % nasal spray, Place 1 spray into both nostrils 2 (two) times daily as needed for rhinitis. Use in each nostril as directed   fluticasone (FLONASE) 50 MCG/ACT nasal spray, Place 2 sprays into both nostrils daily.   levocetirizine (XYZAL) 5 MG tablet, TAKE 1 TABLET BY MOUTH EVERY DAY IN THE EVENING  Current Outpatient Medications (Analgesics):    acetaminophen (TYLENOL) 650 MG CR tablet, Take 1,300 mg by mouth every 8 (eight) hours as needed for pain.  Current Outpatient Medications (Hematological):    vitamin B-12 (CYANOCOBALAMIN) 1000 MCG tablet, Take 1,000 mcg by mouth daily.  Current Outpatient Medications (Other):    cholecalciferol (VITAMIN D3) 25 MCG (1000 UT) tablet, Take 1,000 Units by mouth daily.   Multiple Vitamins-Minerals (MULTIVITAMIN PO), Take 1 tablet by mouth daily.   pantoprazole (PROTONIX) 40 MG tablet, Take 1 tablet (40 mg total) by mouth daily.   triamcinolone cream (KENALOG) 0.1 %, Apply 1 Application topically 2 (two) times daily.  Medical History:  Past Medical History:  Diagnosis Date   ALLERGIC RHINITIS 04/02/2007   Allergy    Arthritis    BREAST BIOPSY, HX OF 04/02/2007   Diverticulosis    FIBROCYSTIC BREAST DISEASE, HX OF 04/02/2007   GERD (gastroesophageal reflux disease)    Heart murmur    Hemorrhoids    HYPERLIPIDEMIA 04/02/2007   HYPERTENSION, BENIGN ESSENTIAL 05/04/2007   Impaired glucose tolerance 11/08/2012   Irritable bowel syndrome 04/02/2007   MITRAL REGURGITATION, 0 (MILD) 04/02/2007   OBESITY 04/02/2007   Allergies:  Allergies  Allergen Reactions   Tramadol Hives   Amlodipine Besy-Benazepril Hcl Itching     REACTION:sore throat   Amlodipine Besy-Benazepril Hcl Itching    REACTION:sore throat   Benazepril Hcl Other (See Comments)   Penicillins     Has patient had a PCN reaction causing immediate rash, facial/tongue/throat swelling, SOB or lightheadedness with hypotension: no Has patient had a PCN reaction causing severe rash involving mucus membranes or skin necrosis: no Has patient had a PCN reaction that required hospitalization no Has patient had a PCN reaction occurring within the last 10 years: yes If all of the above answers are "NO", then may proceed with Cephalosporin use.    Cleocin [Clindamycin Hcl] Itching   Hyoscyamine Other (See Comments)    Intolerance    Zylet [Loteprednol-Tobramycin] Itching     Surgical History:  She  has a past surgical history that includes Breast biopsy (  Left); Leg Surgery (Left); Eye surgery (Left); DG 5TH DIGIT LEFT FOOT (Left); Colonoscopy; Dilatation & curettage/hysteroscopy with myosure (N/A, 06/02/2016); Breast excisional biopsy (Left); Joint replacement; Total hip arthroplasty (Right, 04/28/2018); Total hip arthroplasty (Left, 01/02/2021); and Breast biopsy (Right, 02/04/2023). Family History:  Her family history includes Diabetes in her maternal grandmother; Hypertension in her mother.  REVIEW OF SYSTEMS  : All other systems reviewed and negative except where noted in the History of Present Illness.  PHYSICAL EXAM: LMP 07/31/2013  General Appearance: Well nourished, in no apparent distress. Head:   Normocephalic and atraumatic. Eyes:  sclerae anicteric,conjunctive pink  Respiratory: Respiratory effort normal, BS equal bilaterally without rales, rhonchi, wheezing. Cardio: RRR with no MRGs. Peripheral pulses intact.  Abdomen: Soft,  Obese ,active bowel sounds. No tenderness . Without guarding and Without rebound. No masses. Rectal: Not evaluated Musculoskeletal: Full ROM, Normal gait. Without edema. Skin:  Dry and intact without significant  lesions or rashes Neuro: Alert and  oriented x4;  No focal deficits. Psych:  Cooperative. Normal mood and affect.    Doree Albee, PA-C 12:22 PM

## 2023-06-01 ENCOUNTER — Encounter: Payer: Self-pay | Admitting: Physician Assistant

## 2023-06-01 ENCOUNTER — Telehealth: Payer: Self-pay

## 2023-06-01 ENCOUNTER — Ambulatory Visit: Payer: BC Managed Care – PPO | Admitting: Physician Assistant

## 2023-06-01 VITALS — BP 112/74 | HR 68 | Ht 62.0 in | Wt 186.0 lb

## 2023-06-01 DIAGNOSIS — Z860101 Personal history of adenomatous and serrated colon polyps: Secondary | ICD-10-CM

## 2023-06-01 DIAGNOSIS — K219 Gastro-esophageal reflux disease without esophagitis: Secondary | ICD-10-CM

## 2023-06-01 DIAGNOSIS — K589 Irritable bowel syndrome without diarrhea: Secondary | ICD-10-CM

## 2023-06-01 DIAGNOSIS — K449 Diaphragmatic hernia without obstruction or gangrene: Secondary | ICD-10-CM

## 2023-06-01 DIAGNOSIS — K222 Esophageal obstruction: Secondary | ICD-10-CM

## 2023-06-01 NOTE — Patient Instructions (Signed)
Silent reflux: Not all heartburn burns...Marland KitchenMarland KitchenMarland Kitchen  What is LPR? Laryngopharyngeal reflux (LPR) or silent reflux is a condition in which acid that is made in the stomach travels up the esophagus (swallowing tube) and gets to the throat. Not everyone with reflux has a lot of heartburn or indigestion. In fact, many people with LPR never have heartburn. This is why LPR is called SILENT REFLUX, and the terms "Silent reflux" and "LPR" are often used interchangeably. Because LPR is silent, it is sometimes difficult to diagnose.  How can you tell if you have LPR?  Chronic hoarseness- Some people have hoarseness that comes and goes throat clearing  Cough It can cause shortness of breath and cause asthma like symptoms. a feeling of a lump in the throat  difficulty swallowing a problem with too much nose and throat drainage.  Some people will feel their esophagus spasm which feels like their heart beating hard and fast, this will usually be after a meal, at rest, or lying down at night.    How do I treat this? Treatment for LPR should be individualized, and your doctor will suggest the best treatment for you. Generally there are several treatments for LPR: changing habits and diet to reduce reflux,  medications to reduce stomach acid, and  surgery to prevent reflux. Most people with LPR need to modify how and when they eat, as well as take some medication, to get well. Sometimes, nonprescription liquid antacids, such as Maalox, Gelucil and Mylanta are recommended. When used, these antacids should be taken four times each day - one tablespoon one hour after each meal and before bedtime. Dietary and lifestyle changes alone are not often enough to control LPR - medications that reduce stomach acid are also usually needed. These must be prescribed by our doctor.   TIPS FOR REDUCING REFLUX AND LPR Control your LIFE-STYLE and your DIET! If you use tobacco, QUIT.  Smoking makes you reflux. After every  cigarette you have some LPR.  Don't wear clothing that is too tight, especially around the waist (trousers, corsets, belts).  Do not lie down just after eating...in fact, do not eat within three hours of bedtime.  You should be on a low-fat diet.  Limit your intake of red meat.  Limit your intake of butter.  Avoid fried foods.  Avoid chocolate  Avoid cheese.  Avoid eggs. Specifically avoid caffeine (especially coffee and tea), soda pop (especially cola) and mints.  Avoid alcoholic beverages, particularly in the evening.  Can start to do your protonix to every other day, can try to replace with pepcid/famotidine if you wish.  Due for colonoscopy 2028

## 2023-06-01 NOTE — Progress Notes (Signed)
Noted  

## 2023-06-01 NOTE — Telephone Encounter (Signed)
Entered in error

## 2023-06-24 ENCOUNTER — Ambulatory Visit: Payer: BC Managed Care – PPO | Admitting: Podiatry

## 2023-06-27 ENCOUNTER — Telehealth: Payer: Self-pay | Admitting: Orthopaedic Surgery

## 2023-06-27 NOTE — Telephone Encounter (Signed)
Patient called. She [place] the airport stating that she has replacement limbs. Will come by to pick up. [phone]

## 2023-06-27 NOTE — Telephone Encounter (Signed)
LMOM for patient letting her know that I put the cards at the front desk for her

## 2023-09-23 ENCOUNTER — Other Ambulatory Visit: Payer: Self-pay | Admitting: Internal Medicine

## 2023-09-26 ENCOUNTER — Other Ambulatory Visit: Payer: Self-pay

## 2023-10-10 ENCOUNTER — Telehealth: Payer: Self-pay | Admitting: Physician Assistant

## 2023-10-10 ENCOUNTER — Other Ambulatory Visit: Payer: Self-pay | Admitting: Radiology

## 2023-10-10 ENCOUNTER — Other Ambulatory Visit (INDEPENDENT_AMBULATORY_CARE_PROVIDER_SITE_OTHER)

## 2023-10-10 ENCOUNTER — Other Ambulatory Visit: Payer: Self-pay | Admitting: Allergy

## 2023-10-10 ENCOUNTER — Ambulatory Visit: Payer: Self-pay | Admitting: Physician Assistant

## 2023-10-10 ENCOUNTER — Other Ambulatory Visit: Payer: Self-pay | Admitting: Internal Medicine

## 2023-10-10 DIAGNOSIS — M25561 Pain in right knee: Secondary | ICD-10-CM

## 2023-10-10 DIAGNOSIS — Z96642 Presence of left artificial hip joint: Secondary | ICD-10-CM | POA: Diagnosis not present

## 2023-10-10 NOTE — Progress Notes (Signed)
 Office Visit Note   Patient: Sylvia Davies           Date of Birth: 12-03-1960           MRN: [id number] Visit Date: 10/10/2023              Requested by: Myrlene Broker, MD [address] [place],  [place] [address] PCP: Myrlene Broker, MD   Assessment & Plan: Visit Diagnoses:  1. History of left hip replacement   2. Acute pain of right knee     Plan: Offered her cortisone injection for either the hip and/or the right knee.  She declines.  Therefore we will send her to formal physical therapy for IT band stretching.  IT band stretch was shown to the patient.  Therapy can include modalities and home exercise program.  Regards to her right knee she does have arthritis of the knee and would recommend quad strengthening.  Will have therapy work on quad strengthening at home exercise program for the knee.  She can use Voltaren gel on both areas up to 4 g 4 times daily.  Follow-up with [place] on as-needed basis pain persist or comes worse.  Follow-Up Instructions: Return if symptoms worsen or fail to improve.   Orders:  Orders Placed This Encounter  Procedures   XR Pelvis 1-2 Views   XR Knee 1-2 Views Right   No orders of the defined types were placed in this encounter.     Procedures: No procedures performed   Clinical Data: No additional findings.   Subjective: Chief Complaint  Patient presents with   Right Knee - Pain   Left Hip - Pain    HPI Sylvia Davies comes in today due to right hip pain for the last month.  Pain is worse with walking.  Pain is mostly posterior lateral aspect of the hip.  No known injury.  Denies any numbness tingling down the leg.  Pain 6 out of 10 pain at worst.  She is also having right knee pain for the past month worse with walking.  Pain 6 out of 10 also.  No mechanical symptoms.  No injury to the knee.  She has tried Tylenol for both these without any real relief. History of bilateral total hip arthroplasties left  total hip arthroplasty 01/02/2021.  Review of Systems  Constitutional:  Negative for chills and fever.     Objective: Vital Signs: LMP 07/31/2013   Physical Exam Constitutional:      Appearance: She is not ill-appearing or diaphoretic.  Pulmonary:     Effort: Pulmonary effort is normal.  Neurological:     Mental Status: She is alert and oriented to person, place, and time.  Psychiatric:        Mood and Affect: Mood normal.     Ortho Exam Bilateral hips good range of motion without pain.  Tenderness over the left hip trochanteric region. Bilateral knees: Good range of motion of both knees.  No abnormal warmth erythema or effusion no instability valgus varus stress of either knee.  Tenderness over the medial joint line right knee. Specialty Comments:  No specialty comments available.  Imaging: XR Knee 1-2 Views Right Result Date: 10/10/2023 Right knee 2 views: Shows moderate patellofemoral and moderate medial joint line narrowing with periarticular spurring.  Lateral joint line well-preserved.  No acute fractures acute findings  XR Pelvis 1-2 Views Result Date: 10/10/2023 AP pelvis: Status post bilateral total hip arthroplasty well-seated components.  No acute  fractures acute findings.  Both hips well located.    PMFS History: Patient Active Problem List   Diagnosis Date Noted   Bilateral hearing loss 10/05/2022   Dysphagia 10/05/2022   Alopecia 03/03/2022   Postmenopausal bleeding 03/03/2022   Vaginal dryness 03/03/2022   LVH (left ventricular hypertrophy) due to hypertensive disease, without heart failure 01/30/2021   Encounter for general adult medical examination with abnormal findings 01/29/2021   Impaired glucose tolerance 11/08/2012   HYPERTENSION, BENIGN ESSENTIAL 05/04/2007   Hyperlipidemia 04/02/2007   OBESITY 04/02/2007   MITRAL REGURGITATION, 0 (MILD) 04/02/2007   Allergic rhinitis 04/02/2007   GERD 04/02/2007   Irritable bowel syndrome 04/02/2007   LOW  BACK PAIN 04/02/2007   FIBROCYSTIC BREAST DISEASE, HX OF 04/02/2007   Past Medical History:  Diagnosis Date   ALLERGIC RHINITIS 04/02/2007   Allergy    Arthritis    BREAST BIOPSY, HX OF 04/02/2007   Diverticulosis    FIBROCYSTIC BREAST DISEASE, HX OF 04/02/2007   GERD (gastroesophageal reflux disease)    Heart murmur    Hemorrhoids    HYPERLIPIDEMIA 04/02/2007   HYPERTENSION, BENIGN ESSENTIAL 05/04/2007   Impaired glucose tolerance 11/08/2012   Irritable bowel syndrome 04/02/2007   MITRAL REGURGITATION, 0 (MILD) 04/02/2007   OBESITY 04/02/2007    Family History  Problem Relation Age of Onset   Hypertension Mother    Diabetes Maternal Grandmother    Colon cancer Neg Hx    Colon polyps Neg Hx    Esophageal cancer Neg Hx    Rectal cancer Neg Hx    Stomach cancer Neg Hx     Past Surgical History:  Procedure Laterality Date   BREAST BIOPSY Left    BREAST BIOPSY Right 02/04/2023   [place] RT BREAST BX W LOC DEV 1ST LESION IMG BX SPEC US GUIDE 02/04/2023 [facility]   BREAST EXCISIONAL BIOPSY Left    benign   COLONOSCOPY     DG 5TH DIGIT LEFT FOOT Left    DILATATION & CURETTAGE/HYSTEROSCOPY WITH MYOSURE N/A 06/02/2016   Procedure: DILATATION & CURETTAGE/HYSTEROSCOPY WITH MYOSURE;  Surgeon: Geryl Rankins, MD;  Location: [facility];  Service: Gynecology;  Laterality: N/A;  Myosure - Endometrial Mass   EYE SURGERY Left    tear duct   JOINT REPLACEMENT     Right total hip Dr. Gayla Doss 04-28-18   LEG SURGERY Left    have rods and srcews   TOTAL HIP ARTHROPLASTY Right 04/28/2018   Procedure: RIGHT TOTAL HIP ARTHROPLASTY ANTERIOR APPROACH;  Surgeon: Kathryne Hitch, MD;  Location: [facility];  Service: Orthopedics;  Laterality: Right;   TOTAL HIP ARTHROPLASTY Left 01/02/2021   Procedure: LEFT TOTAL HIP ARTHROPLASTY ANTERIOR APPROACH;  Surgeon: Kathryne Hitch, MD;  Location: [facility];  Service: Orthopedics;  Laterality: Left;   Social History   Occupational History    Occupation: Training and development officer: [employer]  Tobacco Use   Smoking status: Never    Passive exposure: Never   Smokeless tobacco: Never  Vaping Use   Vaping status: Never Used  Substance and Sexual Activity   Alcohol use: Never   Drug use: Never   Sexual activity: Yes    Birth control/protection: Post-menopausal

## 2023-10-10 NOTE — Telephone Encounter (Signed)
 Patient would like a referral for PT to come here.

## 2023-10-10 NOTE — Telephone Encounter (Signed)
 done

## 2023-10-21 ENCOUNTER — Ambulatory Visit: Admitting: Family

## 2023-10-21 ENCOUNTER — Encounter: Payer: Self-pay | Admitting: Family

## 2023-10-21 VITALS — BP 160/90 | HR 67 | Temp 98.2°F | Ht 62.0 in | Wt 189.0 lb

## 2023-10-21 DIAGNOSIS — S0592XA Unspecified injury of left eye and orbit, initial encounter: Secondary | ICD-10-CM

## 2023-10-21 MED ORDER — ERYTHROMYCIN 5 MG/GM OP OINT: 1.0000 | TOPICAL_OINTMENT | Freq: Three times a day (TID) | OPHTHALMIC | 0 refills | Status: DC

## 2023-10-21 NOTE — Progress Notes (Signed)
 Acute Office Visit  Subjective:     Patient ID: Sylvia Davies, female    DOB: April 02, 1961, 63 y.o.   MRN: [id number]  Chief Complaint  Patient presents with  . Eye Pain    Patient state she was weed eating her yard on Wednesday and something got into her Left eye that has been irrigating her. She has been trying flush it out but it has not gotten.     HPI Patient is in today with complaints of feeling like something is in her eye after weed eating 2 days ago.  She has been flushing her eye with water but has been unable to get the sensation to go away.  Denies any blurry vision or double vision no pain.  She is mildly sensitive to light.  No drainage or discharge.  Review of Systems  Constitutional: Negative.   Eyes:  Negative for blurred vision, double vision, pain, discharge and redness.       Feels like something is in her eye  Respiratory: Negative.    Cardiovascular: Negative.   Psychiatric/Behavioral: Negative.    All other systems reviewed and are negative.  Past Medical History:  Diagnosis Date  . ALLERGIC RHINITIS 04/02/2007  . Allergy   . Arthritis   . BREAST BIOPSY, HX OF 04/02/2007  . Diverticulosis   . FIBROCYSTIC BREAST DISEASE, HX OF 04/02/2007  . GERD (gastroesophageal reflux disease)   . Heart murmur   . Hemorrhoids   . HYPERLIPIDEMIA 04/02/2007  . HYPERTENSION, BENIGN ESSENTIAL 05/04/2007  . Impaired glucose tolerance 11/08/2012  . Irritable bowel syndrome 04/02/2007  . MITRAL REGURGITATION, 0 (MILD) 04/02/2007  . OBESITY 04/02/2007    Social History   Socioeconomic History  . Marital status: Married    Spouse name: Not on file  . Number of children: 2  . Years of education: Not on file  . Highest education level: Not on file  Occupational History  . Occupation: Training and development officer: [employer]  Tobacco Use  . Smoking status: Never    Passive exposure: Never  . Smokeless tobacco: Never  Vaping Use  . Vaping  status: Never Used  Substance and Sexual Activity  . Alcohol use: Never  . Drug use: Never  . Sexual activity: Yes    Birth control/protection: Post-menopausal  Other Topics Concern  . Not on file  Social History Narrative  . Not on file   Social Drivers of Health   Financial Resource Strain: Not on file  Food Insecurity: Low Risk  (10/05/2022)   Received from [facility], [facility]   Hunger Vital Sign   . Worried About Programme researcher, broadcasting/film/video in the Last Year: Never true   . Ran Out of Food in the Last Year: Never true  Transportation Needs: No Transportation Needs (10/05/2022)   Received from [facility], [facility]   Transportation   . In the past 12 months, has lack of reliable transportation kept you from medical appointments, meetings, work or from getting things needed for daily living? : No  Physical Activity: Not on file  Stress: Not on file  Social Connections: Not on file  Intimate Partner Violence: Not on file    Past Surgical History:  Procedure Laterality Date  . BREAST BIOPSY Left   . BREAST BIOPSY Right 02/04/2023   [place] RT BREAST BX W LOC DEV 1ST LESION IMG BX SPEC US GUIDE 02/04/2023 [facility]  . BREAST EXCISIONAL BIOPSY Left  benign  . COLONOSCOPY    . DG 5TH DIGIT LEFT FOOT Left   . DILATATION & CURETTAGE/HYSTEROSCOPY WITH MYOSURE N/A 06/02/2016   Procedure: DILATATION & CURETTAGE/HYSTEROSCOPY WITH MYOSURE;  Surgeon: Geryl Rankins, MD;  Location: [facility];  Service: Gynecology;  Laterality: N/A;  Myosure - Endometrial Mass  . EYE SURGERY Left    tear duct  . JOINT REPLACEMENT     Right total hip Dr. Gayla Doss 04-28-18  . LEG SURGERY Left    have rods and srcews  . TOTAL HIP ARTHROPLASTY Right 04/28/2018   Procedure: RIGHT TOTAL HIP ARTHROPLASTY ANTERIOR APPROACH;  Surgeon: Kathryne Hitch, MD;  Location: [facility];  Service: Orthopedics;  Laterality: Right;  . TOTAL HIP ARTHROPLASTY Left 01/02/2021   Procedure: LEFT TOTAL HIP  ARTHROPLASTY ANTERIOR APPROACH;  Surgeon: Kathryne Hitch, MD;  Location: [facility];  Service: Orthopedics;  Laterality: Left;    Family History  Problem Relation Age of Onset  . Hypertension Mother   . Diabetes Maternal Grandmother   . Colon cancer Neg Hx   . Colon polyps Neg Hx   . Esophageal cancer Neg Hx   . Rectal cancer Neg Hx   . Stomach cancer Neg Hx     Allergies  Allergen Reactions  . Tramadol Hives  . Amlodipine Besy-Benazepril Hcl Itching    REACTION:sore throat  . Amlodipine Besy-Benazepril Hcl Itching    REACTION:sore throat  . Benazepril Hcl Other (See Comments)  . Penicillins     Has patient had a PCN reaction causing immediate rash, facial/tongue/throat swelling, SOB or lightheadedness with hypotension: no Has patient had a PCN reaction causing severe rash involving mucus membranes or skin necrosis: no Has patient had a PCN reaction that required hospitalization no Has patient had a PCN reaction occurring within the last 10 years: yes If all of the above answers are "NO", then may proceed with Cephalosporin use.   . Cleocin [Clindamycin Hcl] Itching  . Hyoscyamine Other (See Comments)    Intolerance   . Zylet [Loteprednol-Tobramycin] Itching    Current Outpatient Medications on File Prior to Visit  Medication Sig Dispense Refill  . acetaminophen (TYLENOL) 650 MG CR tablet Take 1,300 mg by mouth every 8 (eight) hours as needed for pain.    [name] amLODipine (NORVASC) 5 MG tablet Take 1 tablet (5 mg total) by mouth daily. 90 tablet 3  . cholecalciferol (VITAMIN D3) 25 MCG (1000 UT) tablet Take 1,000 Units by mouth daily.    . fluticasone (FLONASE) 50 MCG/ACT nasal spray Place 2 sprays into both nostrils daily. 16 g 11  . hydrochlorothiazide (HYDRODIURIL) 25 MG tablet Take 1 tablet (25 mg total) by mouth daily. 90 tablet 3  . levocetirizine (XYZAL) 5 MG tablet TAKE 1 TABLET BY MOUTH EVERY DAY IN THE EVENING 30 tablet 0  . Multiple Vitamins-Minerals  (MULTIVITAMIN PO) Take 1 tablet by mouth daily.    . pantoprazole (PROTONIX) 40 MG tablet Take 1 tablet (40 mg total) by mouth daily. 90 tablet 3  . pravastatin (PRAVACHOL) 10 MG tablet Take 1 tablet (10 mg total) by mouth daily. 90 tablet 3  . triamcinolone cream (KENALOG) 0.1 % APPLY TO AFFECTED AREA TWICE A DAY 30 g 0  . vitamin B-12 (CYANOCOBALAMIN) 1000 MCG tablet Take 1,000 mcg by mouth daily.    . [DISCONTINUED] gabapentin (NEURONTIN) 100 MG capsule Take 1-3 capsules (100-300 mg total) by mouth at bedtime as needed (nerve pain). (Patient not taking: No sig reported) 90 capsule 3  No current facility-administered medications on file prior to visit.    BP (!) 160/90 (BP Location: Left Arm, Patient Position: Sitting, Cuff Size: Normal)   Pulse 67   Temp 98.2 F (36.8 C) (Oral)   Ht 5\' 2"  (1.575 m)   Wt 189 lb (85.7 kg)   LMP 07/31/2013   SpO2 97%   BMI 34.57 kg/m chart     Objective:    BP (!) 160/90 (BP Location: Left Arm, Patient Position: Sitting, Cuff Size: Normal)   Pulse 67   Temp 98.2 F (36.8 C) (Oral)   Ht 5\' 2"  (1.575 m)   Wt 189 lb (85.7 kg)   LMP 07/31/2013   SpO2 97%   BMI 34.57 kg/m    Physical Exam Vitals reviewed.  Constitutional:      Appearance: Normal appearance. She is normal weight.  Eyes:     General:        Right eye: No foreign body.     Extraocular Movements: Extraocular movements intact.     Conjunctiva/sclera: Conjunctivae normal.     Pupils: Pupils are equal, round, and reactive to light.     Comments: Fluorescein stain performed to the left eye: Very mild small pinpoint corneal abrasion noted.  Cardiovascular:     Rate and Rhythm: Normal rate and regular rhythm.  Pulmonary:     Effort: Pulmonary effort is normal.     Breath sounds: Normal breath sounds.  Neurological:     Mental Status: She is alert.   No results found for any visits on 10/21/23.      Assessment & Plan:   Problem List Items Addressed This Visit    None Visit Diagnoses       Non-penetrating injury of left eye, initial encounter    -  Primary       Meds ordered this encounter  Medications  . erythromycin ophthalmic ointment    Sig: Place 1 Application into the left eye 3 (three) times daily.    Dispense:  3.5 g    Refill:  0   Call the office if symptoms worsen or persist.  Will refer to an ophthalmologist if necessary.  Symptoms of infection discussed.  Recheck as scheduled and sooner as needed. No follow-ups on file.  Eulis Foster, FNP

## 2023-10-23 NOTE — Patient Instructions (Incomplete)
 Non-allergic rhinitis-Singulair caued insomnia -Environmental allergy testing 04/2021 both skin testing and blood testing was negative/non-reactive - Use nasal saline rinses before nose sprays such as with Neilmed Sinus Rinse.  Use distilled water.   - Use Flonase 2 sprays each nostril daily. Aim upward and outward. - Use Azelastine 1-2 sprays each nostril twice daily as needed for runny nose, sneezing, drainage, congestion. Aim upward and outward. - Use Xyzal 5mg  daily. If Xyzal makes you sleepy, use Allegra 180mg  daily.  Follow up in 1 year or sooner if needed

## 2023-10-24 ENCOUNTER — Ambulatory Visit: Admitting: Physical Therapy

## 2023-10-24 ENCOUNTER — Encounter: Payer: Self-pay | Admitting: Physical Therapy

## 2023-10-24 DIAGNOSIS — M6281 Muscle weakness (generalized): Secondary | ICD-10-CM

## 2023-10-24 DIAGNOSIS — M25561 Pain in right knee: Secondary | ICD-10-CM | POA: Diagnosis not present

## 2023-10-24 DIAGNOSIS — M25552 Pain in left hip: Secondary | ICD-10-CM

## 2023-10-24 DIAGNOSIS — R2689 Other abnormalities of gait and mobility: Secondary | ICD-10-CM

## 2023-10-24 NOTE — Therapy (Signed)
 OUTPATIENT PHYSICAL THERAPY LOWER EXTREMITY EVALUATION   Patient Name: Marina Boerner MRN: [id number] DOB:[birth year], 63 y.o., female Today's Date: 10/24/2023  END OF SESSION:  PT End of Session - 10/24/23 0841     Visit Number 1    Number of Visits 6    Date for PT Re-Evaluation 12/05/23    Authorization Type [employer]    PT Start Time 0805    PT Stop Time 0840    PT Time Calculation (min) 35 min    Activity Tolerance Patient tolerated treatment well    Behavior During Therapy [facility] for tasks assessed/performed             Past Medical History:  Diagnosis Date   ALLERGIC RHINITIS 04/02/2007   Allergy    Arthritis    BREAST BIOPSY, HX OF 04/02/2007   Diverticulosis    FIBROCYSTIC BREAST DISEASE, HX OF 04/02/2007   GERD (gastroesophageal reflux disease)    Heart murmur    Hemorrhoids    HYPERLIPIDEMIA 04/02/2007   HYPERTENSION, BENIGN ESSENTIAL 05/04/2007   Impaired glucose tolerance 11/08/2012   Irritable bowel syndrome 04/02/2007   MITRAL REGURGITATION, 0 (MILD) 04/02/2007   OBESITY 04/02/2007   Past Surgical History:  Procedure Laterality Date   BREAST BIOPSY Left    BREAST BIOPSY Right 02/04/2023   [place] RT BREAST BX W LOC DEV 1ST LESION IMG BX SPEC US GUIDE 02/04/2023 [facility]   BREAST EXCISIONAL BIOPSY Left    benign   COLONOSCOPY     DG 5TH DIGIT LEFT FOOT Left    DILATATION & CURETTAGE/HYSTEROSCOPY WITH MYOSURE N/A 06/02/2016   Procedure: DILATATION & CURETTAGE/HYSTEROSCOPY WITH MYOSURE;  Surgeon: Geryl Rankins, MD;  Location: [facility];  Service: Gynecology;  Laterality: N/A;  Myosure - Endometrial Mass   EYE SURGERY Left    tear duct   JOINT REPLACEMENT     Right total hip Dr. Gayla Doss 04-28-18   LEG SURGERY Left    have rods and srcews   TOTAL HIP ARTHROPLASTY Right 04/28/2018   Procedure: RIGHT TOTAL HIP ARTHROPLASTY ANTERIOR APPROACH;  Surgeon: Kathryne Hitch, MD;  Location: [facility];  Service: Orthopedics;  Laterality: Right;    TOTAL HIP ARTHROPLASTY Left 01/02/2021   Procedure: LEFT TOTAL HIP ARTHROPLASTY ANTERIOR APPROACH;  Surgeon: Kathryne Hitch, MD;  Location: [facility];  Service: Orthopedics;  Laterality: Left;   Patient Active Problem List   Diagnosis Date Noted   Bilateral hearing loss 10/05/2022   Dysphagia 10/05/2022   Alopecia 03/03/2022   Postmenopausal bleeding 03/03/2022   Vaginal dryness 03/03/2022   LVH (left ventricular hypertrophy) due to hypertensive disease, without heart failure 01/30/2021   Encounter for general adult medical examination with abnormal findings 01/29/2021   Impaired glucose tolerance 11/08/2012   HYPERTENSION, BENIGN ESSENTIAL 05/04/2007   Hyperlipidemia 04/02/2007   OBESITY 04/02/2007   MITRAL REGURGITATION, 0 (MILD) 04/02/2007   Allergic rhinitis 04/02/2007   GERD 04/02/2007   Irritable bowel syndrome 04/02/2007   LOW BACK PAIN 04/02/2007   FIBROCYSTIC BREAST DISEASE, HX OF 04/02/2007    PCP: Myrlene Broker, MD   REFERRING PROVIDER: Kirtland Bouchard, PA-C   REFERRING DIAG: [phone] (ICD-10-CM) - Acute pain of right knee [phone] (ICD-10-CM) - History of left hip replacement  THERAPY DIAG:  Other abnormalities of gait and mobility  Muscle weakness (generalized)  Pain in left hip  Acute pain of right knee  Rationale for Evaluation and Treatment: Rehabilitation  ONSET DATE: Left THA 2022  SUBJECTIVE:   SUBJECTIVE  STATEMENT: She relays not really having much pain now but since her left hip replacement 2022 she still walks with limp and would like to try to correct this with PT. She complains of right knee stiffness but not really having pain .   PERTINENT HISTORY: Bilat THA  PAIN:  NPRS scale:1 /10 upon arrival Pain location:Rt knee, left hip Pain description: constant, achy, sharp Aggravating factors: bending and straight right knee, walking, lifting Relieving factors: rest, meds   PRECAUTIONS: None  RED FLAGS: None   WEIGHT  BEARING RESTRICTIONS: No  FALLS:  Has patient fallen in last 6 months? 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   OBJECTIVE:  Note: Objective measures were completed at Evaluation unless otherwise noted.  DIAGNOSTIC FINDINGS:  Right knee  XR 2 views: "Shows moderate patellofemoral and moderate medial  joint line narrowing with periarticular spurring.  Lateral joint line  well-preserved.  No acute fractures acute findings "  XR AP pelvis: "Status post bilateral total hip arthroplasty well-seated  components.  No acute fractures acute findings.  Both hips well located. "  PATIENT SURVEYS:  Patient-Specific Activity Scoring Scheme  "0" represents "unable to perform." "10" represents "able to perform at prior level. 0 1 2 3 4 5 6 7 8 9  10 (Date and Score)   Activity Eval     1. Walk without limp  6    2. Walk without stiffness  6    Score 12/20    Total score = sum of the activity scores/number of activities Minimum detectable change (90%CI) for average score = 2 points Minimum detectable change (90%CI) for single activity score = 3 points     COGNITION: Overall cognitive status: Within functional limits for tasks assessed     SENSATION: WFL    LEG LENGTH: Rt leg 33 inch ASIS to inferior medial malleolus, Left leg 32.5 inch.    LOWER EXTREMITY ROM: grossly WFL bilat    LOWER EXTREMITY MMT:  MMT Right eval Left eval  Hip flexion 4 4  Hip extension 4 3+  Hip abduction 4 3+  Hip adduction    Hip internal rotation 5 4  Hip external rotation 5 4  Knee flexion 5 5  Knee extension 55   Ankle dorsiflexion    Ankle plantarflexion    Ankle inversion    Ankle eversion     (Blank rows = not tested)  FUNCTIONAL TESTS:  Single leg stance: 3-5 seconds on left compared to 18 seconds on  Right  GAIT Assistive device utilized: None Level of assistance: Complete Independence Comments: slight limp left leg  TODAY'S TREATMENT:  Eval HEP creation and review with demonstration and trial set preformed, see below for details Selfcare: education and rationale to trial  heel lift to correct leg length discrepancy as well as how to reduce limp and improve gait.      PATIENT EDUCATION: Education details: HEP, PT plan of care Person educated: Patient Education method: Explanation, Demonstration, Verbal cues, and Handouts Education comprehension: verbalized understanding and needs further education   HOME EXERCISE PROGRAM: Access Code: [email or web address] URL: [email or web address] Date: 10/24/2023 Prepared by: Ivery Quale  Exercises - Standing Hip Abduction with Resistance at Ankles and Counter Support  - 2 x daily - 6 x weekly - 2 sets - 10 reps - Standing Hip  Extension with Resistance at Ankles and Counter Support  - 2 x daily - 6 x weekly - 2 sets - 10 reps - Standing Hip Flexion with Resistance Loop  - 2 x daily - 6 x weekly - 2 sets - 10 reps - Single Leg Stance  - 2 x daily - 6 x weekly - 1 sets - 5 reps - 10 sec hold - Walking March  - 2 x daily - 6 x weekly - 3 sets - 10 reps  ASSESSMENT:  CLINICAL IMPRESSION: Patient referred to PT for left hip pain and Right knee pain.  Upon PT evaluation today she is not having much pain and her chief complaint is limping when she walks and she would like to correct this. It was found that she had a leg length descrepency so I did provide her with heel lift to trial as well as exercises for single leg stability, balance, and hip strength to help improve her gait. At this time she would like to try HEP first but she will call back if needed to schedule more PT. PT plan of care set for 6 weeks in event she wishes to return to PT and at that time patient would benefit from skilled PT to address below impairments, limitations and  improve overall function.  OBJECTIVE IMPAIRMENTS: decreased activity tolerance, difficulty walking, decreased balance, decreased endurance, decreased mobility, decreased ROM, decreased strength, impaired flexibility, and pain.  ACTIVITY LIMITATIONS: bending, lifting, carry, locomotion, cleaning, community activity, driving, and or occupation  PERSONAL FACTORS: bilat THA, are also affecting patient's functional outcome.  REHAB POTENTIAL: Good  CLINICAL DECISION MAKING: Stable/uncomplicated  EVALUATION COMPLEXITY: Low    GOALS: Short term PT Goals Target date: 11/21/2023   Pt will be I and compliant with HEP. Baseline:  Goal status: New  Long term PT goals Target date:12/05/2023  Pt will improve  hip/knee strength to at least 5-/5 MMT to improve functional strength Baseline: Goal status: New Pt will improve PSFS to at least 16/20 functional to show improved function Baseline: Goal status: New Pt will be able to ambulate community distances at least 1000 ft WNL gait pattern without complaints Baseline: Goal status: New  PLAN: PT FREQUENCY: 1-3 times per week   PT DURATION: 6-8 weeks  PLANNED INTERVENTIONS (unless contraindicated): aquatic PT, Canalith repositioning, cryotherapy, Electrical stimulation, Iontophoresis with 4 mg/ml dexamethasome, Moist heat, traction, Ultrasound, gait training, Therapeutic exercise, balance training, neuromuscular re-education, patient/family education, manual techniques, passive ROM, dry needling, taping, vasopnuematic device, vestibular, spinal manipulations, joint manipulations 97110-Therapeutic exercises, 97530- Therapeutic activity, 97112- Neuromuscular re-education, 97535- Self Care, and 81191- Manual therapy  PLAN FOR NEXT SESSION: review HEP, hip stability and reduce limp, how was heel lift?                                                                                                                                 April Manson,  PT,DPT 10/24/2023, 8:42  AM

## 2023-10-25 ENCOUNTER — Encounter: Payer: Self-pay | Admitting: Family

## 2023-10-25 ENCOUNTER — Other Ambulatory Visit: Payer: Self-pay

## 2023-10-25 ENCOUNTER — Ambulatory Visit: Payer: Self-pay | Admitting: Family

## 2023-10-25 VITALS — BP 140/76 | HR 72 | Temp 97.8°F | Wt 150.3 lb

## 2023-10-25 DIAGNOSIS — J310 Chronic rhinitis: Secondary | ICD-10-CM | POA: Diagnosis not present

## 2023-10-25 MED ORDER — FEXOFENADINE HCL 180 MG PO TABS: 180.0000 mg | ORAL_TABLET | Freq: Every day | ORAL | 5 refills | Status: DC

## 2023-10-25 MED ORDER — FLUTICASONE PROPIONATE 50 MCG/ACT NA SUSP: 2.0000 | Freq: Every day | NASAL | 11 refills | Status: AC

## 2023-10-25 NOTE — Progress Notes (Signed)
 [address] [place] [address] Dept: [phone]  FOLLOW UP NOTE  Patient ID: Sylvia Davies, female    DOB: 1961/01/09  Age: 63 y.o. MRN: [id number] Date of Office Visit: 10/25/2023  Assessment  Chief Complaint: Follow-up (Allergies ), Nasal Congestion (Sore throat.), and Medication Refill  HPI Sylvia Davies is a 63 year old female who presents today for follow-up of nonallergic rhinitis.  She was last seen on October 11, 2022 by Dr. Allena Katz.  She denies any new diagnosis or surgeries.  She reports that she has lost about 39 pounds since walking every day.  Nonallergic rhinitis: She is currently taking Xyzal 5 mg every day after 12.  If she takes Xyzal earlier in the day she will be sleepy.  She has not ever tried Careers adviser.  She does use Flonase nasal spray 1 spray each nostril twice a day.  She is not able to use azelastine nasal spray due to it causing a burning sensation.  She has also tried Dymista in the past and it caused burning also.  Singulair has caused insomnia.  She reports nasal congestion, sore throat at times, and itchy ears and throat.  She denies rhinorrhea, postnasal drip, fever, chills, and rash.  She has not been treated for any sinus infections since we last saw her.   Drug Allergies:  Allergies  Allergen Reactions   Tramadol Hives   Amlodipine Besy-Benazepril Hcl Itching    REACTION:sore throat   Amlodipine Besy-Benazepril Hcl Itching    REACTION:sore throat   Benazepril Hcl Other (See Comments)   Penicillins     Has patient had a PCN reaction causing immediate rash, facial/tongue/throat swelling, SOB or lightheadedness with hypotension: no Has patient had a PCN reaction causing severe rash involving mucus membranes or skin necrosis: no Has patient had a PCN reaction that required hospitalization no Has patient had a PCN reaction occurring within the last 10 years: yes If all of the above answers are "NO", then may proceed with Cephalosporin  use.    Cleocin [Clindamycin Hcl] Itching   Hyoscyamine Other (See Comments)    Intolerance    Zylet [Loteprednol-Tobramycin] Itching    Review of Systems: Negative except as per HPI   Physical Exam: BP (!) 140/76   Pulse 72   Temp 97.8 F (36.6 C)   Wt 150 lb 4.8 oz (68.2 kg)   LMP 07/31/2013   SpO2 99%   BMI 27.49 kg/m    Physical Exam Constitutional:      Appearance: Normal appearance.  HENT:     Head: Normocephalic and atraumatic.     Comments: Pharynx normal, eyes normal, ears normal, nose: Bilateral lower turbinates moderately edematous with clear drainage noted    Right Ear: Tympanic membrane, ear canal and external ear normal.     Left Ear: Tympanic membrane, ear canal and external ear normal.     Mouth/Throat:     Mouth: Mucous membranes are moist.     Pharynx: Oropharynx is clear.  Eyes:     Conjunctiva/sclera: Conjunctivae normal.  Cardiovascular:     Rate and Rhythm: Regular rhythm.     Heart sounds: Normal heart sounds.  Pulmonary:     Effort: Pulmonary effort is normal.     Breath sounds: Normal breath sounds.     Comments: Lungs clear to auscultation Musculoskeletal:     Cervical back: Neck supple.  Skin:    General: Skin is warm.  Neurological:     Mental Status: She is alert and  oriented to person, place, and time.  Psychiatric:        Mood and Affect: Mood normal.        Behavior: Behavior normal.        Thought Content: Thought content normal.        Judgment: Judgment normal.     Diagnostics:  none  Assessment and Plan: 1. Chronic rhinitis     Meds ordered this encounter  Medications   fluticasone (FLONASE) 50 MCG/ACT nasal spray    Sig: Place 2 sprays into both nostrils daily.    Dispense:  16 g    Refill:  11   fexofenadine (ALLEGRA ALLERGY) 180 MG tablet    Sig: Take 1 tablet (180 mg total) by mouth daily.    Dispense:  30 tablet    Refill:  5    Patient Instructions  Non-allergic rhinitis-Singulair caued insomnia  and Dymista caused burning -Environmental allergy testing 04/2021 both skin testing and blood testing was negative/non-reactive - Use nasal saline rinses before nose sprays such as with Neilmed Sinus Rinse.  Use distilled water.   - Use Flonase 2 sprays each nostril daily. Aim upward and outward. - Stopped azelastine nasal spray due to burning - Stop Xyzal (levocetirizine) -Start Allegra (fexofenadine)180 mg daily as needed. Caution as this may make you sleepy. Try this over the weekend when you do not have to work and see if it makes you sleepy  Follow up in 1 year or sooner if needed   Return in about 1 year (around 10/24/2024), or if symptoms worsen or fail to improve.    Thank you for the opportunity to care for this patient.  Please do not hesitate to contact me with questions.  Tinnie Forehand, FNP [facility] 

## 2023-10-27 ENCOUNTER — Telehealth: Payer: Self-pay

## 2023-10-27 NOTE — Telephone Encounter (Signed)
 Patient called in - DOB/DPR verified - wanted to verify/confirm prescriptions that were sent in by Chrissie on Tuesday, 10/25/23 to [facility] - stated she's receiving message that it's time to refill medications because she's low but she has never gotten the prescriptions.  Patient was advised to contact pharmacy for clarity - confirmed medications were sent in on the 10/25/23 - pharmacy received prescriptions @ 9:58 am - confirmed transmittal.  Patient verbalized understanding, will contact pharmacy.

## 2023-10-31 ENCOUNTER — Telehealth: Payer: Self-pay | Admitting: Allergy

## 2023-10-31 NOTE — Telephone Encounter (Signed)
 Patient called and stated that the tablets she was given from the pharmacy are too big for her to swallow. Patient is requesting medication in a smaller form. Pharmacy is [facility] [address]. Patient is requesting a call back regarding this issue. Call back number is [phone]

## 2023-10-31 NOTE — Telephone Encounter (Signed)
 Patient called due to having issues with swallowing the allegra  pills she picked up from the pharmacy. I recommended the patient to cut the allegra  in half and do 1 half twice a day due to difficulty swallowing. Patient plans to go back to xyzal  after allegra  is completed due to how big the pills are.

## 2023-11-16 ENCOUNTER — Ambulatory Visit (INDEPENDENT_AMBULATORY_CARE_PROVIDER_SITE_OTHER)

## 2023-11-16 ENCOUNTER — Ambulatory Visit: Admitting: Podiatrist

## 2023-11-16 DIAGNOSIS — D2371 Other benign neoplasm of skin of right lower limb, including hip: Secondary | ICD-10-CM | POA: Diagnosis not present

## 2023-11-16 DIAGNOSIS — M7751 Other enthesopathy of right foot: Secondary | ICD-10-CM

## 2023-11-16 NOTE — Progress Notes (Signed)
  Chief Complaint  Patient presents with   Foot Pain    RM#1 Right foot pain feels like she has a knot on the outside portion of foot noticed it 2 weeks ago.     HPI: Patient is 63 y.o. female who presents today for the above mentioned complaint. History confirmed with patient.    Allergies  Allergen Reactions   Tramadol  Hives   Amlodipine  Besy-Benazepril  Hcl Itching    REACTION:sore throat   Amlodipine  Besy-Benazepril  Hcl Itching    REACTION:sore throat   Benazepril  Hcl Other (See Comments)   Penicillins     Has patient had a PCN reaction causing immediate rash, facial/tongue/throat swelling, SOB or lightheadedness with hypotension: no Has patient had a PCN reaction causing severe rash involving mucus membranes or skin necrosis: no Has patient had a PCN reaction that required hospitalization no Has patient had a PCN reaction occurring within the last 10 years: yes If all of the above answers are "NO", then may proceed with Cephalosporin use.    Cleocin [Clindamycin Hcl] Itching   Hyoscyamine Other (See Comments)    Intolerance    Zylet [Loteprednol-Tobramycin] Itching    Review of systems is negative except as noted in the HPI.  Denies nausea/ vomiting/ fevers/ chills or night sweats.   Denies difficulty breathing, denies calf pain or tenderness  Physical Exam  Patient is awake, alert, and oriented x 3.  In no acute distress.    Vascular status is intact with palpable pedal pulses DP and PT bilateral and capillary refill time less than 3 seconds bilateral.  No edema or erythema noted.   Neurological exam reveals epicritic and protective sensation grossly intact bilateral.   Dermatological exam reveals skin is supple and dry to bilateral feet.  Large hyperkeratotic skin lesion present sub fifth metatarsal head and right hallux.  Pain with direct pressure noted.    Musculoskeletal exam: pes planovalgus foot deformity present bilateral.  Decrease range of motion first mpj  noted right prominent fifth met head right noted   Xrays:  3 views right foot obtained.  Pes planus foot type noted. Decreased joint space first mpj noted.  Slight lateral orientation of fifth metatarsal present.  No acute fractures or dislocation noted.   Assessment:   ICD-10-CM   1. Capsulitis of metatarsophalangeal (MTP) joint of right foot  M77.51 DG Foot Complete Right    2. Benign neoplasm of skin of right foot  D23.71        Plan: Pared lesions with a 15 blade without complication.  Applied salinocaine and a bandage and gave instructions for aftercare. Recommended continued use of orthotics in a stable supportive lace up shoe.  Recommended at home care with pumice stone.  Recommended a return if the lesions become painful.

## 2023-12-06 ENCOUNTER — Other Ambulatory Visit: Payer: Self-pay | Admitting: Internal Medicine

## 2023-12-06 DIAGNOSIS — Z1231 Encounter for screening mammogram for malignant neoplasm of breast: Secondary | ICD-10-CM

## 2023-12-08 ENCOUNTER — Ambulatory Visit: Admitting: Podiatry

## 2023-12-16 ENCOUNTER — Telehealth: Payer: Self-pay | Admitting: Family

## 2023-12-16 NOTE — Telephone Encounter (Signed)
 Pt called and we scheduled yearly f/u for 10/2024. Pt also asked about new rx for xyzal  due to not wanting to take the large allegra  pills. I advised would have to put back to clinical staff to fill xyzal  rx, and they may want to call and discuss further with her.

## 2023-12-19 NOTE — Telephone Encounter (Signed)
 I called the patient and left a message to call the office back to inform.

## 2023-12-23 MED ORDER — LEVOCETIRIZINE DIHYDROCHLORIDE 5 MG PO TABS: 5.0000 mg | ORAL_TABLET | Freq: Every evening | ORAL | 3 refills | Status: AC

## 2023-12-23 NOTE — Telephone Encounter (Signed)
 Xyzal  has been sent in and I called the patient to notify her of Dr. Gerold Kos previous note. I left a message to call back with any further questions or concerns.

## 2023-12-23 NOTE — Addendum Note (Signed)
 Addended by: Jackqulyn Masse on: 12/23/2023 02:02 PM   Modules accepted: Orders

## 2024-01-20 ENCOUNTER — Ambulatory Visit
Admission: RE | Admit: 2024-01-20 | Discharge: 2024-01-20 | Disposition: A | Discharge status: Home / Self Care | Source: Ambulatory Visit | Attending: Internal Medicine | Admitting: Internal Medicine

## 2024-01-20 DIAGNOSIS — Z1231 Encounter for screening mammogram for malignant neoplasm of breast: Secondary | ICD-10-CM

## 2024-01-24 ENCOUNTER — Ambulatory Visit: Payer: Self-pay | Admitting: Internal Medicine

## 2024-01-27 LAB — HM MAMMOGRAPHY

## 2024-01-31 ENCOUNTER — Telehealth: Payer: Self-pay

## 2024-01-31 NOTE — Telephone Encounter (Signed)
 Copied from CRM [id number]. Topic: Appointments - Scheduling Inquiry for Clinic >> Jan 31, 2024 11:18 AM Mesmerise C wrote: Reason for CRM: Patient inquiring if she needs to be fasting on her [phone] on 8/15 and will labs be drawn that day patient can be reached at

## 2024-02-03 ENCOUNTER — Other Ambulatory Visit: Payer: Self-pay | Admitting: Internal Medicine

## 2024-02-03 DIAGNOSIS — I10 Essential (primary) hypertension: Secondary | ICD-10-CM

## 2024-02-03 DIAGNOSIS — I119 Hypertensive heart disease without heart failure: Secondary | ICD-10-CM

## 2024-02-21 ENCOUNTER — Encounter: Payer: BC Managed Care – PPO | Admitting: Internal Medicine

## 2024-02-24 ENCOUNTER — Ambulatory Visit (INDEPENDENT_AMBULATORY_CARE_PROVIDER_SITE_OTHER): Admitting: Internal Medicine

## 2024-02-24 ENCOUNTER — Encounter: Payer: Self-pay | Admitting: Internal Medicine

## 2024-02-24 VITALS — BP 118/64 | HR 67 | Temp 98.2°F | Ht 62.0 in | Wt 189.0 lb

## 2024-02-24 DIAGNOSIS — Z0001 Encounter for general adult medical examination with abnormal findings: Secondary | ICD-10-CM

## 2024-02-24 DIAGNOSIS — Z23 Encounter for immunization: Secondary | ICD-10-CM

## 2024-02-24 DIAGNOSIS — R202 Paresthesia of skin: Secondary | ICD-10-CM | POA: Insufficient documentation

## 2024-02-24 DIAGNOSIS — I10 Essential (primary) hypertension: Secondary | ICD-10-CM

## 2024-02-24 DIAGNOSIS — E782 Mixed hyperlipidemia: Secondary | ICD-10-CM | POA: Diagnosis not present

## 2024-02-24 DIAGNOSIS — I119 Hypertensive heart disease without heart failure: Secondary | ICD-10-CM

## 2024-02-24 DIAGNOSIS — R7302 Impaired glucose tolerance (oral): Secondary | ICD-10-CM

## 2024-02-24 DIAGNOSIS — R200 Anesthesia of skin: Secondary | ICD-10-CM

## 2024-02-24 DIAGNOSIS — K589 Irritable bowel syndrome without diarrhea: Secondary | ICD-10-CM

## 2024-02-24 LAB — COMPREHENSIVE METABOLIC PANEL WITH GFR
ALT: 12 U/L (ref 0–35)
AST: 16 U/L (ref 0–37)
Albumin: 4 g/dL (ref 3.5–5.2)
Alkaline Phosphatase: 103 U/L (ref 39–117)
BUN: 10 mg/dL (ref 6–23)
CO2: 28 meq/L (ref 19–32)
Calcium: 9.3 mg/dL (ref 8.4–10.5)
Chloride: 103 meq/L (ref 96–112)
Creatinine, Ser: 0.6 mg/dL (ref 0.40–1.20)
GFR: 94.7 mL/min (ref >60.00)
Glucose, Bld: 97 mg/dL (ref 70–99)
Potassium: 3.6 meq/L (ref 3.5–5.1)
Sodium: 140 meq/L (ref 135–145)
Total Bilirubin: 0.6 mg/dL (ref 0.2–1.2)
Total Protein: 7.3 g/dL (ref 6.0–8.3)

## 2024-02-24 LAB — VITAMIN D 25 HYDROXY (VIT D DEFICIENCY, FRACTURES): VITD: 73.79 ng/mL (ref 30.00–100.00)

## 2024-02-24 LAB — TSH: TSH: 1.13 u[IU]/mL (ref 0.35–5.50)

## 2024-02-24 LAB — LIPID PANEL
Cholesterol: 182 mg/dL (ref 0–200)
HDL: 52.9 mg/dL (ref >39.00)
LDL Cholesterol: 112 mg/dL — ABNORMAL HIGH (ref 0–99)
NonHDL: 128.83
Total CHOL/HDL Ratio: 3
Triglycerides: 83 mg/dL (ref 0.0–149.0)
VLDL: 16.6 mg/dL (ref 0.0–40.0)

## 2024-02-24 LAB — CBC
HCT: 40.1 % (ref 36.0–46.0)
Hemoglobin: 13.4 g/dL (ref 12.0–15.0)
MCHC: 33.3 g/dL (ref 30.0–36.0)
MCV: 81 fl (ref 78.0–100.0)
Platelets: 213 K/uL (ref 150.0–400.0)
RBC: 4.95 Mil/uL (ref 3.87–5.11)
RDW: 15.3 % (ref 11.5–15.5)
WBC: 4.1 K/uL (ref 4.0–10.5)

## 2024-02-24 LAB — VITAMIN B12: Vitamin B-12: 1426 pg/mL — ABNORMAL HIGH (ref 211–911)

## 2024-02-24 LAB — HEMOGLOBIN A1C: Hgb A1c MFr Bld: 6.3 % (ref 4.6–6.5)

## 2024-02-24 MED ORDER — PANTOPRAZOLE SODIUM 40 MG PO TBEC: 40.0000 mg | DELAYED_RELEASE_TABLET | Freq: Every day | ORAL | 3 refills | Status: AC

## 2024-02-24 MED ORDER — TRIAMCINOLONE ACETONIDE 0.1 % EX CREA: TOPICAL_CREAM | Freq: Two times a day (BID) | CUTANEOUS | 0 refills | Status: DC

## 2024-02-24 MED ORDER — PRAVASTATIN SODIUM 10 MG PO TABS: 10.0000 mg | ORAL_TABLET | Freq: Every day | ORAL | 3 refills | Status: AC

## 2024-02-24 MED ORDER — AMLODIPINE BESYLATE 5 MG PO TABS: 5.0000 mg | ORAL_TABLET | Freq: Every day | ORAL | 3 refills | Status: AC

## 2024-02-24 MED ORDER — HYDROCHLOROTHIAZIDE 25 MG PO TABS: 25.0000 mg | ORAL_TABLET | Freq: Every day | ORAL | 3 refills | Status: AC

## 2024-02-24 NOTE — Assessment & Plan Note (Signed)
 Checking CBC, TSH, vitamin D  and B12 and HgA1c. Adjust as needed. We discussed if no metabolic etiology could be related to back arthritis.

## 2024-02-24 NOTE — Assessment & Plan Note (Signed)
BP at goal continue meds

## 2024-02-24 NOTE — Assessment & Plan Note (Signed)
 Checking HGA1c and adjust as needed.

## 2024-02-24 NOTE — Progress Notes (Signed)
   Subjective:   Patient ID: Sylvia Davies, female    DOB: [birth year], 63 y.o.   MRN: [id number]  The patient is here for physical. Pertinent topics discussed: Discussed the use of AI scribe software for clinical note transcription with the patient, who gave verbal consent to proceed.  History of Present Illness Sylvia Davies is a 63 year old female who presents for physical but with new with tingling in her left foot.  She has been experiencing tingling in her left foot for about a month, primarily occurring at night. There is no associated back pain, injury, or falls. She previously worked in a freezer and mentioned this when discussing her health history.   PMH, [facility], social history reviewed and updated  Review of Systems  Constitutional: Negative.   HENT: Negative.    Eyes: Negative.   Respiratory:  Negative for cough, chest tightness and shortness of breath.   Cardiovascular:  Negative for chest pain, palpitations and leg swelling.  Gastrointestinal:  Negative for abdominal distention, abdominal pain, constipation, diarrhea, nausea and vomiting.  Musculoskeletal: Negative.   Skin: Negative.   Neurological:  Positive for numbness.  Psychiatric/Behavioral: Negative.      Objective:  Physical Exam Constitutional:      Appearance: She is well-developed.  HENT:     Head: Normocephalic and atraumatic.  Cardiovascular:     Rate and Rhythm: Normal rate and regular rhythm.  Pulmonary:     Effort: Pulmonary effort is normal. No respiratory distress.     Breath sounds: Normal breath sounds. No wheezing or rales.  Abdominal:     General: Bowel sounds are normal. There is no distension.     Palpations: Abdomen is soft.     Tenderness: There is no abdominal tenderness. There is no rebound.  Musculoskeletal:     Cervical back: Normal range of motion.  Skin:    General: Skin is warm and dry.  Neurological:     Mental Status: She is alert and oriented to  person, place, and time.     Coordination: Coordination normal.     Vitals:   02/24/24 0839  BP: 118/64  Pulse: 67  Temp: 98.2 F (36.8 C)  TempSrc: Oral  SpO2: 97%  Weight: 189 lb (85.7 kg)  Height: 5' 2 (1.575 m)    Assessment & Plan:  Prevnar 20 given at visit

## 2024-02-24 NOTE — Assessment & Plan Note (Signed)
 Discussed fiber intake and other otc meds to help with constipation.

## 2024-02-24 NOTE — Assessment & Plan Note (Signed)
Checking lipid panel and adjust statin as needed.  

## 2024-02-24 NOTE — Assessment & Plan Note (Signed)
 BP at goal on regimen. Checking CMP and CBC and lipid panel and adjust hydrochlorothiazide , amlodipine  as needed.

## 2024-02-24 NOTE — Assessment & Plan Note (Signed)
 Flu shot yearly. Pneumonia given 20. Shingrix  complete. Tetanus up to date. Colonoscopy up to date. Mammogram up to date, pap smear up to date. Counseled about sun safety and mole surveillance. Counseled about the dangers of distracted driving. Given 10 year screening recommendations.

## 2024-02-27 ENCOUNTER — Ambulatory Visit: Payer: Self-pay | Admitting: Internal Medicine

## 2024-02-27 ENCOUNTER — Telehealth: Payer: Self-pay

## 2024-02-27 NOTE — Telephone Encounter (Signed)
 Copied from CRM #[id number]. Topic: Clinical - Lab/Test Results >> Feb 27, 2024  8:22 AM Berneda FALCON wrote: Reason for CRM: Patient would like to know more about the vitamin D  deficiency on her lab results. Wants to know if she needs to have something called in for her because the over the counter may not be working for her. Lipids also show abnormal and wants to know more about that as well.  Patient callback is [phone]

## 2024-03-01 NOTE — Telephone Encounter (Signed)
 Called patient back and we went over her recent lab results and was able to address's her concerns and questions and she verbalized she understood and will work on better managing her healthy lifestyle herself and declines seeing a nutritionist for now. This has also been documented in results notes.

## 2024-03-07 ENCOUNTER — Telehealth: Payer: Self-pay

## 2024-03-07 NOTE — Telephone Encounter (Signed)
 Copied from CRM [id number]. Topic: Clinical - Prescription Issue >> Mar 07, 2024  8:35 AM Martinique E wrote: Reason for CRM: Patient spoke with her pharmacy this morning and they stated they do not have her hydrochlorothiazide  (HYDRODIURIL ) 25 MG tablet ready for her. Agent was able to see that the receipt was confirmed by the pharmacy on 8/15, but patient stated she has been out of this medication for a month. Callback number for patient is [phone].

## 2024-03-07 NOTE — Telephone Encounter (Signed)
 Called patient pharmacy and I was informed that it was too early for her to fill she can fill around sep 24th. Patient verbalized she understood

## 2024-05-14 ENCOUNTER — Encounter: Payer: Self-pay | Admitting: Radiology

## 2024-05-22 ENCOUNTER — Encounter: Payer: Self-pay | Admitting: Emergency Medicine

## 2024-05-22 ENCOUNTER — Ambulatory Visit: Admitting: Emergency Medicine

## 2024-05-22 VITALS — BP 146/80 | HR 62 | Temp 98.0°F | Ht 62.0 in | Wt 183.0 lb

## 2024-05-22 DIAGNOSIS — J22 Unspecified acute lower respiratory infection: Secondary | ICD-10-CM | POA: Insufficient documentation

## 2024-05-22 DIAGNOSIS — R053 Chronic cough: Secondary | ICD-10-CM | POA: Diagnosis not present

## 2024-05-22 LAB — POC COVID19 BINAXNOW: SARS Coronavirus 2 Ag: NEGATIVE

## 2024-05-22 LAB — POCT INFLUENZA A/B
Influenza A, POC: NEGATIVE
Influenza B, POC: NEGATIVE

## 2024-05-22 MED ORDER — PROMETHAZINE-DM 6.25-15 MG/5ML PO SYRP: 5.0000 mL | ORAL_SOLUTION | Freq: Every evening | ORAL | 0 refills | Status: AC | PRN

## 2024-05-22 MED ORDER — BENZONATATE 200 MG PO CAPS: 200.0000 mg | ORAL_CAPSULE | Freq: Two times a day (BID) | ORAL | 0 refills | Status: AC | PRN

## 2024-05-22 MED ORDER — AZITHROMYCIN 250 MG PO TABS: ORAL_TABLET | ORAL | 0 refills | Status: AC

## 2024-05-22 NOTE — Assessment & Plan Note (Signed)
 Cough management discussed. Recommend over-the-counter Mucinex DM as needed Phenergan  DM at bedtime as needed Tessalon 200 mg 3 times a day as needed Advised to rest and stay well-hydrated Advised to contact the office if no better or worse during the next several days or weeks.

## 2024-05-22 NOTE — Progress Notes (Signed)
 Sylvia Davies 63 y.o.   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     Prior to Admission medications   Medication Sig Start Date End Date Taking? Authorizing Provider  acetaminophen  (TYLENOL ) 650 MG CR tablet Take 1,300 mg by mouth every 8 (eight) hours as needed for pain.   Yes [provider]  amLODipine  (NORVASC ) 5 MG tablet Take 1 tablet (5 mg total) by mouth daily. 02/24/24  Yes Rollene Almarie LABOR, MD  cholecalciferol  (VITAMIN D3) 25 MCG (1000 UT) tablet Take 1,000 Units by mouth daily.   Yes [provider]  fluticasone  (FLONASE ) 50 MCG/ACT nasal spray Place 2 sprays into both nostrils daily. 10/25/23  Yes Cheryl Reusing, FNP  hydrochlorothiazide  (HYDRODIURIL ) 25 MG tablet Take 1 tablet (25 mg total) by mouth daily. 02/24/24  Yes Rollene Almarie LABOR, MD  levocetirizine (XYZAL ) 5 MG tablet Take 1 tablet (5 mg total) by mouth every evening. 12/23/23  Yes Padgett, Danita Macintosh, MD  Multiple Vitamins-Minerals (MULTIVITAMIN PO) Take 1 tablet by mouth daily.   Yes [provider]  pantoprazole  (PROTONIX ) 40 MG tablet Take 1 tablet (40 mg total) by mouth daily. 02/24/24  Yes Rollene Almarie LABOR, MD  pravastatin  (PRAVACHOL ) 10 MG tablet Take 1 tablet (10 mg total) by mouth daily. 02/24/24  Yes Rollene Almarie LABOR, MD   triamcinolone  cream (KENALOG ) 0.1 % Apply topically 2 (two) times daily. 02/24/24  Yes Rollene Almarie LABOR, MD  vitamin B-12 (CYANOCOBALAMIN ) 1000 MCG tablet Take 1,000 mcg by mouth daily.   Yes [provider]  gabapentin  (NEURONTIN ) 100 MG capsule Take 1-3 capsules (100-300 mg total) by mouth at bedtime as needed (nerve pain). Patient not taking: No sig reported 10/03/20 01/02/21  Joane Artist RAMAN, MD    Allergies  Allergen Reactions   Tramadol  Hives   Amlodipine  Besy-Benazepril  Hcl Itching    REACTION:sore throat   Amlodipine  Besy-Benazepril  Hcl Itching    REACTION:sore throat   Benazepril  Hcl Other (See Comments)   Penicillins     Has patient had a PCN reaction causing immediate rash, facial/tongue/throat swelling, SOB or lightheadedness with hypotension: no Has patient had a PCN reaction causing severe rash involving mucus membranes or skin necrosis: no Has patient had a PCN reaction that required hospitalization no Has patient had a PCN reaction occurring within the last 10 years: yes If all of the above answers are NO, then may proceed with Cephalosporin use.    Cleocin [Clindamycin Hcl] Itching   Hyoscyamine Other (See Comments)    Intolerance    Zylet [Loteprednol-Tobramycin] Itching    Patient Active Problem List   Diagnosis Date Noted   Numbness and tingling of foot 02/24/2024   Bilateral hearing loss 10/05/2022   Dysphagia 10/05/2022   Alopecia 03/03/2022   Postmenopausal bleeding 03/03/2022   LVH (left ventricular hypertrophy) due  to hypertensive disease, without heart failure 01/30/2021   Encounter for general adult medical examination with abnormal findings 01/29/2021   Impaired glucose tolerance 11/08/2012   HYPERTENSION, BENIGN ESSENTIAL 05/04/2007   Hyperlipidemia 04/02/2007   OBESITY 04/02/2007   MITRAL REGURGITATION, 0 (MILD) 04/02/2007   Allergic rhinitis 04/02/2007   GERD 04/02/2007   Irritable bowel syndrome 04/02/2007   FIBROCYSTIC BREAST  DISEASE, HX OF 04/02/2007    Past Medical History:  Diagnosis Date   ALLERGIC RHINITIS 04/02/2007   Allergy    Arthritis    BREAST BIOPSY, HX OF 04/02/2007   Diverticulosis    FIBROCYSTIC BREAST DISEASE, HX OF 04/02/2007   GERD (gastroesophageal reflux disease)    Heart murmur    Hemorrhoids    HYPERLIPIDEMIA 04/02/2007   HYPERTENSION, BENIGN ESSENTIAL 05/04/2007   Impaired glucose tolerance 11/08/2012   Irritable bowel syndrome 04/02/2007   MITRAL REGURGITATION, 0 (MILD) 04/02/2007   OBESITY 04/02/2007    Past Surgical History:  Procedure Laterality Date   BREAST BIOPSY Left    BREAST BIOPSY Right 02/04/2023   US  RT BREAST BX W LOC DEV 1ST LESION IMG BX SPEC US  GUIDE 02/04/2023 [facility]   BREAST EXCISIONAL BIOPSY Left    benign   COLONOSCOPY     DG 5TH DIGIT LEFT FOOT Left    DILATATION & CURETTAGE/HYSTEROSCOPY WITH MYOSURE N/A 06/02/2016   Procedure: DILATATION & CURETTAGE/HYSTEROSCOPY WITH MYOSURE;  Surgeon: Hargis Paradise, MD;  Location: [facility];  Service: Gynecology;  Laterality: N/A;  Myosure - Endometrial Mass   EYE SURGERY Left    tear duct   JOINT REPLACEMENT     Right total hip Dr. Bernadine 04-28-18   LEG SURGERY Left    have rods and srcews   TOTAL HIP ARTHROPLASTY Right 04/28/2018   Procedure: RIGHT TOTAL HIP ARTHROPLASTY ANTERIOR APPROACH;  Surgeon: Vernetta Lonni GRADE, MD;  Location: [facility];  Service: Orthopedics;  Laterality: Right;   TOTAL HIP ARTHROPLASTY Left 01/02/2021   Procedure: LEFT TOTAL HIP ARTHROPLASTY ANTERIOR APPROACH;  Surgeon: Vernetta Lonni GRADE, MD;  Location: [facility];  Service: Orthopedics;  Laterality: Left;    Social History   Socioeconomic History   Marital status: Married    Spouse name: Not on file   Number of children: 2   Years of education: Not on file   Highest education level: Not on file  Occupational History   Occupation: engineer, maintenance (it)    Employer: [employer]  Tobacco Use   Smoking  status: Never    Passive exposure: Never   Smokeless tobacco: Never  Vaping Use   Vaping status: Never Used  Substance and Sexual Activity   Alcohol use: Never   Drug use: Never   Sexual activity: Yes    Birth control/protection: Post-menopausal  Other Topics Concern   Not on file  Social History Narrative   Not on file   Social Drivers of Health   Financial Resource Strain: Not on file  Food Insecurity: Low Risk  (10/05/2022)   Received from [facility]   Hunger Vital Sign    Within the past 12 months, you worried that your food would run out before you got money to buy more: Never true    Within the past 12 months, the food you bought just didn't last and you didn't have money to get more. : Never true  Transportation Needs: No Transportation Needs (10/05/2022)   Received from [facility]    In the past 12  months, has lack of reliable transportation kept you from medical appointments, meetings, work or from getting things needed for daily living? : No  Physical Activity: Not on file  Stress: Not on file  Social Connections: Not on file  Intimate Partner Violence: Not on file    Family History  Problem Relation Age of Onset   Hypertension Mother    Diabetes Maternal Grandmother    Colon cancer Neg Hx    Colon polyps Neg Hx    Esophageal cancer Neg Hx    Rectal cancer Neg Hx    Stomach cancer Neg Hx      Review of Systems  Constitutional: Negative.  Negative for chills and fever.  HENT:  Positive for congestion. Negative for sore throat.   Respiratory:  Positive for cough and sputum production. Negative for hemoptysis, shortness of breath and wheezing.   Cardiovascular: Negative.  Negative for chest pain and palpitations.  Gastrointestinal:  Negative for abdominal pain, diarrhea, nausea and vomiting.  Genitourinary: Negative.  Negative for dysuria and hematuria.  Skin: Negative.  Negative for rash.  Neurological: Negative.  Negative for  dizziness and headaches.  All other systems reviewed and are negative.   Vitals:   05/22/24 0911 05/22/24 0918  BP: (!) 146/80 (!) 146/80  Pulse: 62   Temp: 98 F (36.7 C)   SpO2: 99%     Physical Exam Vitals reviewed.  Constitutional:      Appearance: Normal appearance.  HENT:     Head: Normocephalic.     Mouth/Throat:     Mouth: Mucous membranes are moist.     Pharynx: Oropharynx is clear.  Eyes:     Extraocular Movements: Extraocular movements intact.     Pupils: Pupils are equal, round, and reactive to light.  Cardiovascular:     Rate and Rhythm: Normal rate and regular rhythm.     Pulses: Normal pulses.     Heart sounds: Normal heart sounds.  Pulmonary:     Effort: Pulmonary effort is normal.     Breath sounds: Normal breath sounds.  Musculoskeletal:     Cervical back: No tenderness.  Lymphadenopathy:     Cervical: No cervical adenopathy.  Skin:    General: Skin is warm and dry.     Capillary Refill: Capillary refill takes less than 2 seconds.  Neurological:     General: No focal deficit present.     Mental Status: She is alert and oriented to person, place, and time.  Psychiatric:        Mood and Affect: Mood normal.        Behavior: Behavior normal.    Results for orders placed or performed in visit on 05/22/24 (from the past 24 hours)  POCT Influenza A/B     Status: Normal   Collection Time: 05/22/24 10:20 AM  Result Value Ref Range   Influenza A, POC Negative Negative   Influenza B, POC Negative Negative  POC COVID-19     Status: Normal   Collection Time: 05/22/24 10:20 AM  Result Value Ref Range   SARS Coronavirus 2 Ag Negative Negative     ASSESSMENT & PLAN: Problem List Items Addressed This Visit       Respiratory   Lower respiratory infection - Primary   Clinically stable.  No red flag signs or symptoms. Upper viral respiratory infection now with secondary bacterial infection No clinical findings of pneumonia Recommend daily  azithromycin  for 5 days Advised to rest and stay well-hydrated Symptom management  discussed Advised to contact the office if no better or worse during the next several days or weeks. Recommend follow-up with PCP      Relevant Medications   azithromycin  (ZITHROMAX ) 250 MG tablet     Other   Persistent cough   Cough management discussed. Recommend over-the-counter Mucinex DM as needed Phenergan  DM at bedtime as needed Tessalon 200 mg 3 times a day as needed Advised to rest and stay well-hydrated Advised to contact the office if no better or worse during the next several days or weeks.      Relevant Medications   benzonatate (TESSALON) 200 MG capsule   promethazine -dextromethorphan (PROMETHAZINE -DM) 6.25-15 MG/5ML syrup   Patient Instructions  Acute Bronchitis, Adult  Acute bronchitis is when air tubes in the lungs (bronchi) suddenly get swollen. The condition can make it hard for you to breathe. In adults, acute bronchitis usually goes away within 2 weeks. A cough caused by bronchitis may last up to 3 weeks. Smoking, allergies, and asthma can make the condition worse. What are the causes? Germs that cause cold and flu (viruses). The most common cause of this condition is the virus that causes the common cold. Bacteria. Substances that bother (irritate) the lungs, including: Smoke from cigarettes and other types of tobacco. Dust and pollen. Fumes from chemicals, gases, or burned fuel. Indoor or outdoor air pollution. What increases the risk? A weak body's defense system. This is also called the immune system. Any condition that affects your lungs and breathing, such as asthma. What are the signs or symptoms? A cough. Coughing up clear, yellow, or green mucus. Making high-pitched whistling sounds when you breathe, most often when you breathe out (wheezing). Runny or stuffy nose. Having too much mucus in your lungs (chest congestion). Shortness of breath. Body aches. A sore  throat. How is this treated? Acute bronchitis may go away over time without treatment. Your doctor may tell you to: Drink more fluids. This will help thin your mucus so it is easier to cough up. Use a device that gets medicine into your lungs (inhaler). Use a vaporizer or a humidifier. These are machines that add water to the air. This helps with coughing and poor breathing. Take a medicine that thins mucus and helps clear it from your lungs. Take a medicine that prevents or stops coughing. It is not common to take an antibiotic medicine for this condition. Follow these instructions at home:  Take over-the-counter and prescription medicines only as told by your doctor. Use an inhaler, vaporizer, or humidifier as told by your doctor. Take two teaspoons (10 mL) of honey at bedtime. This helps lessen your coughing at night. Drink enough fluid to keep your pee (urine) pale yellow. Do not smoke or use any products that contain nicotine or tobacco. If you need help quitting, ask your doctor. Get a lot of rest. Return to your normal activities when your doctor says that it is safe. Keep all follow-up visits. How is this prevented?  Wash your hands often with soap and water for at least 20 seconds. If you cannot use soap and water, use hand sanitizer. Avoid contact with people who have cold symptoms. Try not to touch your mouth, nose, or eyes with your hands. Avoid breathing in smoke or chemical fumes. Make sure to get the flu shot every year. Contact a doctor if: Your symptoms do not get better in 2 weeks. You have trouble coughing up the mucus. Your cough keeps you awake at night. You  have a fever. Get help right away if: You cough up blood. You have chest pain. You have very bad shortness of breath. You faint or keep feeling like you are going to faint. You have a very bad headache. Your fever or chills get worse. These symptoms may be an emergency. Get help right away. Call your  local emergency services (911 in the [place] Do not wait to see if the symptoms will go away. Do not drive yourself to the hospital. Summary Acute bronchitis is when air tubes in the lungs (bronchi) suddenly get swollen. In adults, acute bronchitis usually goes away within 2 weeks. Drink more fluids. This will help thin your mucus so it is easier to cough up. Take over-the-counter and prescription medicines only as told by your doctor. Contact a doctor if your symptoms do not improve after 2 weeks of treatment. This information is not intended to replace advice given to you by your health care provider. Make sure you discuss any questions you have with your health care provider. Document Revised: 10/29/2020 Document Reviewed: 10/29/2020 Elsevier Patient Education  2024 [employer] Inc.    Emil Schaumann, MD [facility]

## 2024-05-22 NOTE — Assessment & Plan Note (Signed)
 Clinically stable.  No red flag signs or symptoms. Upper viral respiratory infection now with secondary bacterial infection No clinical findings of pneumonia Recommend daily azithromycin  for 5 days Advised to rest and stay well-hydrated Symptom management discussed Advised to contact the office if no better or worse during the next several days or weeks. Recommend follow-up with PCP

## 2024-05-22 NOTE — Patient Instructions (Signed)
 Acute Bronchitis, Adult  Acute bronchitis is when air tubes in the lungs (bronchi) suddenly get swollen. The condition can make it hard for you to breathe. In adults, acute bronchitis usually goes away within 2 weeks. A cough caused by bronchitis may last up to 3 weeks. Smoking, allergies, and asthma can make the condition worse. What are the causes? Germs that cause cold and flu (viruses). The most common cause of this condition is the virus that causes the common cold. Bacteria. Substances that bother (irritate) the lungs, including: Smoke from cigarettes and other types of tobacco. Dust and pollen. Fumes from chemicals, gases, or burned fuel. Indoor or outdoor air pollution. What increases the risk? A weak body's defense system. This is also called the immune system. Any condition that affects your lungs and breathing, such as asthma. What are the signs or symptoms? A cough. Coughing up clear, yellow, or green mucus. Making high-pitched whistling sounds when you breathe, most often when you breathe out (wheezing). Runny or stuffy nose. Having too much mucus in your lungs (chest congestion). Shortness of breath. Body aches. A sore throat. How is this treated? Acute bronchitis may go away over time without treatment. Your doctor may tell you to: Drink more fluids. This will help thin your mucus so it is easier to cough up. Use a device that gets medicine into your lungs (inhaler). Use a vaporizer or a humidifier. These are machines that add water to the air. This helps with coughing and poor breathing. Take a medicine that thins mucus and helps clear it from your lungs. Take a medicine that prevents or stops coughing. It is not common to take an antibiotic medicine for this condition. Follow these instructions at home:  Take over-the-counter and prescription medicines only as told by your doctor. Use an inhaler, vaporizer, or humidifier as told by your doctor. Take two teaspoons  (10 mL) of honey at bedtime. This helps lessen your coughing at night. Drink enough fluid to keep your pee (urine) pale yellow. Do not smoke or use any products that contain nicotine or tobacco. If you need help quitting, ask your doctor. Get a lot of rest. Return to your normal activities when your doctor says that it is safe. Keep all follow-up visits. How is this prevented?  Wash your hands often with soap and water for at least 20 seconds. If you cannot use soap and water, use hand sanitizer. Avoid contact with people who have cold symptoms. Try not to touch your mouth, nose, or eyes with your hands. Avoid breathing in smoke or chemical fumes. Make sure to get the flu shot every year. Contact a doctor if: Your symptoms do not get better in 2 weeks. You have trouble coughing up the mucus. Your cough keeps you awake at night. You have a fever. Get help right away if: You cough up blood. You have chest pain. You have very bad shortness of breath. You faint or keep feeling like you are going to faint. You have a very bad headache. Your fever or chills get worse. These symptoms may be an emergency. Get help right away. Call your local emergency services (911 in the [place] Do not wait to see if the symptoms will go away. Do not drive yourself to the hospital. Summary Acute bronchitis is when air tubes in the lungs (bronchi) suddenly get swollen. In adults, acute bronchitis usually goes away within 2 weeks. Drink more fluids. This will help thin your mucus so it is easier  to cough up. Take over-the-counter and prescription medicines only as told by your doctor. Contact a doctor if your symptoms do not improve after 2 weeks of treatment. This information is not intended to replace advice given to you by your health care provider. Make sure you discuss any questions you have with your health care provider. Document Revised: 10/29/2020 Document Reviewed: 10/29/2020 Elsevier Patient  Education  2024 [employer].

## 2024-07-10 ENCOUNTER — Encounter: Payer: Self-pay | Admitting: Family Medicine

## 2024-07-10 ENCOUNTER — Ambulatory Visit: Admitting: Family Medicine

## 2024-07-10 VITALS — BP 124/70 | HR 65 | Temp 97.7°F | Ht 62.0 in | Wt 182.8 lb

## 2024-07-10 DIAGNOSIS — Z6833 Body mass index (BMI) 33.0-33.9, adult: Secondary | ICD-10-CM

## 2024-07-10 DIAGNOSIS — E669 Obesity, unspecified: Secondary | ICD-10-CM | POA: Diagnosis not present

## 2024-07-10 DIAGNOSIS — L853 Xerosis cutis: Secondary | ICD-10-CM

## 2024-07-10 DIAGNOSIS — Z124 Encounter for screening for malignant neoplasm of cervix: Secondary | ICD-10-CM

## 2024-07-10 MED ORDER — CLOBETASOL PROPIONATE 0.05 % EX CREA: 1.0000 | TOPICAL_CREAM | Freq: Two times a day (BID) | CUTANEOUS | 3 refills | Status: AC

## 2024-07-10 NOTE — Progress Notes (Signed)
 "  Acute Office Visit  Subjective:     Patient ID: Sylvia Davies, female    DOB: 17-Nov-1960, 63 y.o.   MRN: [id number]  Chief Complaint  Patient presents with   Acute Visit    Ongoing for about 3 weeks, dry itchy hands. Noticed knots on bilateral pinky fingers. Were painful not anymore. Has stopped using all lotions/creams    HPI  Discussed the use of AI scribe software for clinical note transcription with the patient, who gave verbal consent to proceed.  History of Present Illness Sylvia Davies is a 63 year old female who presents with hand pain and skin peeling.  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  Constitutional:      General: She is not in acute distress.    Appearance: Normal appearance.  HENT:     Head: Normocephalic and atraumatic.     Right Ear: External ear normal.     Left Ear: External ear normal.     Nose: Nose normal.     Mouth/Throat:     Mouth: Mucous  membranes are moist.     Pharynx: Oropharynx is clear.  Eyes:     Extraocular Movements: Extraocular movements intact.     Pupils: Pupils are equal, round, and reactive to light.  Cardiovascular:     Rate and Rhythm: Normal rate and regular rhythm.  Pulmonary:     Effort: Pulmonary effort is normal.  Musculoskeletal:        General: Normal range of motion.     Cervical back: Normal range of motion.     Right lower leg: No edema.     Left lower leg: No edema.  Lymphadenopathy:     Cervical: No cervical adenopathy.  Skin:    Comments: Dry, cracked skin to both hands, dorsal and palmar surfaces. Worst at dorsal R little finger with fissure over MIP and PIP joints. No erythema, no discharge, no bleeding  Neurological:     General: No focal deficit present.     Mental Status: She is alert and oriented to person, place, and time.  Psychiatric:        Mood and Affect: Mood normal.        Thought Content: Thought content normal.     No results found for any visits on 07/10/24.      Assessment & Plan:   Assessment and Plan Assessment & Plan Xerosis cutis  Chronic xerosis cutis exacerbated by frequent hand washing and drying soaps. Considered differential of splinter or foreign body, but no definitive diagnosis. - Prescribed clobetasol cream twice daily as needed. - Recommended [employer] working hands lotion during the day and Vaseline at night. - Advised use of white cotton gloves at night for better absorption. - Suggested using soft soap at home and carrying personal soap for work. - Discussed [employer] creamy baby oil as an alternative to Vaseline.  Cervical cancer screen - referral to OBGYN today  Obesity, BMI 33 Emphasized healthy lifestyle, weight management, and dietary choices. - Encouraged physical activity and healthy eating. - Suggested healthy snacks: protein bars, peanut butter crackers, bananas. - Advised maintaining hydration with water  intake.     Orders Placed This Encounter  Procedures   Ambulatory referral to Obstetrics / Gynecology    Referral Priority:   Routine    Referral Type:   Consultation    Referral Reason:   Specialty Services Required    Requested Specialty:   Obstetrics and Gynecology    Number of Visits Requested:   1     Meds ordered this encounter  Medications   clobetasol cream (TEMOVATE) 0.05 %    Sig: Apply 1 Application topically 2 (two) times daily.    Dispense:  60 g    Refill:  3    Return if symptoms worsen or fail to improve.  Corean LITTIE Ku, FNP  "

## 2024-07-10 NOTE — Patient Instructions (Addendum)
 [employer] working firefighter, secondary school teacher and [employer] creamy baby oil (with baby lotion).  Clobetasol ointment to mix half and half with vaseline at night with cotton gloves for a hand mask.  Follow-up with me for new or worsening symptoms.  Softsoap for work to horticulturist, commercial.

## 2024-08-16 ENCOUNTER — Telehealth: Payer: Self-pay

## 2024-08-16 NOTE — Telephone Encounter (Signed)
 Copied from CRM #[id number]. Topic: Referral - Request for Referral >> Aug 16, 2024 11:19 AM Alfonso HERO wrote: Did the patient discuss referral with their provider in the last year? No (If No - schedule appointment) (If Yes - send message)  Appointment offered? Yes  Type of order/referral and detailed reason for visit:  no reason given  Preference of office, provider, location: [facility]  If referral order, have you been seen by this specialty before? No (If Yes, this issue or another issue? When? Where?  Can we respond through MyChart? Yes

## 2024-08-16 NOTE — Telephone Encounter (Signed)
 I don't see a reason requested for referral.

## 2024-08-17 NOTE — Telephone Encounter (Signed)
 Likely would need visit with us  first ok to schedule

## 2024-08-27 ENCOUNTER — Ambulatory Visit: Admitting: Internal Medicine

## 2024-08-29 ENCOUNTER — Ambulatory Visit: Admitting: Internal Medicine

## 2024-10-11 ENCOUNTER — Ambulatory Visit: Admitting: Allergy

## 2025-02-25 ENCOUNTER — Encounter: Admitting: Internal Medicine

## 2025-03-01 ENCOUNTER — Encounter: Admitting: Internal Medicine
